# Patient Record
Sex: Female | Born: 1990
Health system: Southern US, Community
[De-identification: ages and names within clinical notes are randomized; demographics above are authoritative.]

## PROBLEM LIST (undated history)

## (undated) ENCOUNTER — Inpatient Hospital Stay (HOSPITAL_COMMUNITY): Payer: Self-pay

## (undated) DIAGNOSIS — A630 Anogenital (venereal) warts: Secondary | ICD-10-CM

## (undated) DIAGNOSIS — K624 Stenosis of anus and rectum: Secondary | ICD-10-CM

## (undated) DIAGNOSIS — I10 Essential (primary) hypertension: Secondary | ICD-10-CM

## (undated) DIAGNOSIS — K644 Residual hemorrhoidal skin tags: Secondary | ICD-10-CM

## (undated) DIAGNOSIS — B977 Papillomavirus as the cause of diseases classified elsewhere: Secondary | ICD-10-CM

## (undated) DIAGNOSIS — D649 Anemia, unspecified: Secondary | ICD-10-CM

## (undated) DIAGNOSIS — K8020 Calculus of gallbladder without cholecystitis without obstruction: Secondary | ICD-10-CM

## (undated) HISTORY — DX: Residual hemorrhoidal skin tags: K64.4

## (undated) HISTORY — DX: Anemia, unspecified: D64.9

## (undated) HISTORY — DX: Papillomavirus as the cause of diseases classified elsewhere: B97.7

## (undated) HISTORY — DX: Calculus of gallbladder without cholecystitis without obstruction: K80.20

---
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
## 2010-01-20 ENCOUNTER — Emergency Department (HOSPITAL_COMMUNITY): Admission: EM | Admit: 2010-01-20 | Discharge: 2010-01-21 | Payer: Self-pay | Admitting: Emergency Medicine

## 2010-03-26 ENCOUNTER — Inpatient Hospital Stay (HOSPITAL_COMMUNITY)
Admission: AD | Admit: 2010-03-26 | Discharge: 2010-03-26 | Payer: Self-pay | Source: Home / Self Care | Attending: Obstetrics and Gynecology | Admitting: Obstetrics and Gynecology

## 2010-06-07 LAB — URINE CULTURE
Colony Count: 15000
Culture  Setup Time: 201112301818

## 2010-06-07 LAB — WET PREP, GENITAL: Clue Cells Wet Prep HPF POC: NONE SEEN

## 2010-06-07 LAB — URINALYSIS, ROUTINE W REFLEX MICROSCOPIC
Bilirubin Urine: NEGATIVE
Glucose, UA: NEGATIVE mg/dL
Hgb urine dipstick: NEGATIVE
Ketones, ur: >80 mg/dL — AB
Nitrite: NEGATIVE
Protein, ur: 30 mg/dL — AB
Specific Gravity, Urine: 1.025 (ref 1.005–1.030)
Urobilinogen, UA: 1 mg/dL (ref 0.0–1.0)
pH: 6 (ref 5.0–8.0)

## 2010-06-07 LAB — URINE MICROSCOPIC-ADD ON

## 2010-06-07 LAB — GC/CHLAMYDIA PROBE AMP, GENITAL
Chlamydia, DNA Probe: NEGATIVE
GC Probe Amp, Genital: NEGATIVE

## 2010-07-13 LAB — CBC
HCT: 34.3 % — ABNORMAL LOW (ref 36.0–46.0)
Hemoglobin: 11.5 g/dL — ABNORMAL LOW (ref 12.0–15.0)
MCHC: 33.5 g/dL (ref 30.0–36.0)
MCV: 86.2 fL (ref 78.0–100.0)
Platelets: 275 10*3/uL (ref 150–400)
RBC: 3.98 MIL/uL (ref 3.87–5.11)
RDW: 13.4 % (ref 11.5–15.5)
WBC: 8.6 10*3/uL (ref 4.0–10.5)

## 2010-07-13 LAB — WET PREP, GENITAL
Clue Cells Wet Prep HPF POC: NONE SEEN
Trich, Wet Prep: NONE SEEN
Yeast Wet Prep HPF POC: NONE SEEN

## 2010-07-13 LAB — GC/CHLAMYDIA PROBE AMP, GENITAL
Chlamydia, DNA Probe: POSITIVE — AB
GC Probe Amp, Genital: NEGATIVE

## 2010-07-13 LAB — POCT PREGNANCY, URINE: Preg Test, Ur: POSITIVE

## 2010-07-13 LAB — ABO/RH: ABO/RH(D): A POS

## 2010-07-13 LAB — HCG, QUANTITATIVE, PREGNANCY: hCG, Beta Chain, Quant, S: 848 m[IU]/mL — ABNORMAL HIGH (ref <5)

## 2010-08-13 NOTE — H&P (Signed)
[place]  Patient:    XOIE, KREUSER Visit Number: [id number] MRN: [id number]          Service Type: PSY Location: 600 0600 01 Attending Physician:  Chaney Born. Dictated by:   Donnelly Angelica, M.D. Admit Date:  04/27/2001                     Psychiatric Admission Assessment  INTRODUCTION:  Amber Blanchard is an 20 year old girl.  CHIEF COMPLAINT:  Philip was admitted to the hospital after she reportedly threatened to stab her teacher.  HISTORY OF PRESENT ILLNESS:  Shantasia said she did make the threat to stab her teacher but she said she would not really do it.  She was mad at the teacher because the teacher had accused her of talking in class and was taking her out of the class apparently.  She said everybody else was talking to and it was unfair that she should be singled out.  Erynne said it is not unusual for her to get into trouble at school.  She was in [facility] to begin with for telling her other teacher to shut up.  She says she gets into trouble regularly for running her mouth at school and at home.  She said she has always been this way and it is not new.  She said, in reality, even though she makes threats to kill and hurt people, she would not actually kill anybody.  She might be willing to hurt them however.  FAMILY/SCHOOL/SOCIAL ISSUES:  She says she lives with her mother and she and her mother get along fairly well.  Her dad, she says, is in prison.  She does not know why he is there.  He did not tell her and mother did not tell her either.  She said she would like to see him but he does not want visitors in prison.  Consequently, she has not seen him.  She said she has four other sisters, all half-sisters, and two half-brothers, who do not live in the home. She denied any history of physical or sexual abuse.  She says school is not her favorite time of the day.  She does okay academically but she has always had behavioral  problems.  PREVIOUS PSYCHIATRIC TREATMENT:  This is her fourth psychiatric hospitalization.  She said the other three were in [place], where she was living, and she said they did help her some with her temper control she thinks.  She supposed to be seeing an outpatient therapist and psychiatrist in the [facility] but reportedly is not consistent with appointments.  MEDICAL PROBLEMS/ALLERGIES/MEDICATIONS:  She is not aware of any medical problems.  She has no known allergies to medications.  She said she is supposed to be taking Zyprexa but she has not been taking it.  She has a history of bed-wetting.  She did not mention that to me.  LEGAL/SUBSTANCE ABUSE ISSUES:  None.  MENTAL STATUS:  At the time of the initial evaluation revealed an alert, oriented girl, who came to the interview willingly and was cooperative.  She was appropriately dressed and groomed.  She admitted to threatening her teacher and she admitted to making threats to other people over time as well as to causing a lot of problems because of her attitude but she said, in reality, she would not kill anyone or even try killing anybody.  She denied any threats towards herself and said she did not feel that  depressed, though she seems to be more depressed than she admits.  There was no evidence of any thought disorder or other psychosis.  Short and long-term memory appeared to be intact based on her ability to recall recent and remote events in her own life.  Her judgment currently seemed adequate.  Insight was minimal. Intellectual functioning seemed at least average.  Concentration was adequate from one-to-one interview.  ASSETS:  Tvisha seems cooperative so far.  ADMISSION DIAGNOSES: Axis I:    1. Mood disorder not otherwise specified.            2. Oppositional defiant disorder. Axis II:   Deferred. Axis III:  Enuresis. Axis IV:   Severe. Axis V:    45/55.  ESTIMATED LENGTH OF STAY:   Five to seven days.  PLAN:  Stabilize to the point of making no threats towards anyone and having a plan for dealing with her temper more effectively by the time of discharge.  Dr. Burna Cash will be the attending. Dictated by:   Donnelly Angelica, M.D. Attending Physician:  Chaney Born DD:  04/28/01 TD:  04/30/01 Job: [id number]

## 2010-08-13 NOTE — Consult Note (Signed)
NAMEGLORIANNE, PROCTOR NO.:  [id number]   MEDICAL RECORD NO.:  [id number]          PATIENT TYPE:  MAT   LOCATION:  [facility]                          FACILITY:  [facility]   PHYSICIAN:  Eli Hose, M.D.DATE OF BIRTH:  1990-09-29   DATE OF CONSULTATION:  09/23/2004  DATE OF DISCHARGE:                                   CONSULTATION   HISTORY OF PRESENT ILLNESS:  Ms. Greenley is a 20 year old female, gravida 0,  who presents complaining of tender swelling in the vagina.  The patient was  seen earlier and was told that she had a Bartholin's abscess.  She was  started on Keflex and she was given pain medication.  The patient reports  that she is not sexually active and she has had no other problems.   PAST MEDICAL HISTORY:  The patient is obese, and she has hypertension.   DRUG ALLERGIES:  No known drug allergies.   SOCIAL HISTORY:  The patient denies cigarette use, alcohol use and  recreational drug use.   OBJECTIVE:  VITAL SIGNS:  Temperature is 98.2, pulse 80, respirations 20,  blood pressure is 114/85.  EXAM:  External genitalia is normal except for erythema on the left with  drainage from an open Bartholin's abscess.  The area is slightly tender to  exam.  There is no evidence of cellulitis.   ASSESSMENT:  Draining left Bartholin's abscess.   PLAN:  The patient reports that she feels much better, and therefore we will  simply allow the patient to sit in a tub of hot water two to three times a  day.  She will continue her Keflex 500 mg twice each day for 10 days.  She  will return to the office in two to three weeks for follow-up examination.  She will call for questions or concerns or should she not continue to  improve.       AVS/MEDQ  D:  09/23/2004  T:  09/23/2004  Job:  [id number]

## 2010-08-13 NOTE — Discharge Summary (Signed)
[place]  Patient:    Amber Blanchard, Amber Blanchard Visit Number: [id number] MRN: [id number]          Service Type: PSY Location: 600 0600 01 Attending Physician:  Chaney Born. Dictated by:   Chaney Born, M.D. Admit Date:  04/27/2001 Disc. Date: 05/04/01                             Discharge Summary  REASON FOR ADMISSION:  This 20 year old African-American female was admitted for inpatient psychiatric stabilization after becoming agitated and threatening to to stab her teacher.  For further history of present illness, please see the patients psychiatric admission assessment.  PHYSICAL EXAMINATION AT THE TIME OF ADMISSION:  History of chronic constipation, overweight, ringworm on her left arm, as well as a history of urinary incontinence.  She had an otherwise unremarkable physical examination.  LABORATORY EXAMINATION:  Basic metabolic panel was within normal limits.  CBC showed MCHC 34.1 and was otherwise unremarkable.  Free T4 was within normal limits, TSH was within normal limits.  GGT was within normal limits.  Hepatic panel was within normal limits.  Urine drug screen was negative.  Blood alcohol level was undetectable.  UA was unremarkable.  The patient received no x-rays, no special procedures, no additional consultations.  She sustained no complications during the course of this hospitalization.  HOSPITAL COURSE:  On admission, the patients affect and mood were depressed, irritable, and angry.  She showed frequent temper outbursts and oppositional and defiant behavior.  Her concentration and attention span were decreased. She was isolative and withdrawn.  She was begun on a trial of Effexor XR but was unable to swallow pills large enough to be a therapeutic dose and was discontinued from Effexor.  She was given Celexa liquid at 20 mg p.o. q.d. and tolerated this medication well without side effects.  At the time of discharge she denies any  homicidal or suicidal ideation.  Her affect and mood have improved.  She is participating in all aspects of the therapeutic treatment program.  She is less oppositional and defiant and more redirectable within the milieu.  She no longer appears to be a danger to herself or other and is motivated for outpatient therapy.  Consequently, it is felt she has reached her maximum benefits of hospitalization and is ready for discharge to a less restrictive alternative setting.  CONDITION ON DISCHARGE:  Improved.  DIAGNOSES: Axis I:    1. Major depression, recurrent type, severe without psychosis.            2. Oppositional defiant disorder.            3. Rule out conduct disorder. Axis II:   1. Rule out learning disorder, not otherwise specified.            2. Rule out personality disorder, not otherwise specified. Axis III:  1. Enuresis.            2. Obesity.            3. Ringworm.            4. Chronic constipation. Axis IV:   Current psychosocial stressors are severe. Axis V:    20 on admission, 30 on discharge.  FURTHER EVALUATION AND TREATMENT RECOMMENDATIONS: 1. The patient is discharged to home. 2. She is discharged on an unrestricted level of activity and a regular diet. 3. She will follow up with her outpatient psychiatrist  for all further aspects    of her psychiatric care.  As she will be following up with her outpatient    psychiatrist and primary care physician for all further aspects of her    medical care, I will sign off on the case at this time.  DISCHARGE MEDICATIONS: 1. Celexa liquid 20 mg p.o. q.d. 2. DDAVP 0.2 mg p.o. q.h.s. Dictated by:   Chaney Born, M.D. Attending Physician:  Chaney Born DD:  05/04/01 TD:  05/04/01 Job: [id number]

## 2010-10-19 ENCOUNTER — Inpatient Hospital Stay (HOSPITAL_COMMUNITY)
Admission: RE | Admit: 2010-10-19 | Discharge: 2010-10-23 | DRG: 766 | Disposition: A | Discharge status: Home / Self Care | Payer: 59 | Source: Ambulatory Visit | Attending: Obstetrics and Gynecology | Admitting: Obstetrics and Gynecology

## 2010-10-19 ENCOUNTER — Encounter (HOSPITAL_COMMUNITY): Payer: Self-pay

## 2010-10-19 DIAGNOSIS — O324XX0 Maternal care for high head at term, not applicable or unspecified: Secondary | ICD-10-CM | POA: Diagnosis present

## 2010-10-19 DIAGNOSIS — D649 Anemia, unspecified: Secondary | ICD-10-CM

## 2010-10-19 DIAGNOSIS — O480 Post-term pregnancy: Principal | ICD-10-CM | POA: Diagnosis present

## 2010-10-19 DIAGNOSIS — O330 Maternal care for disproportion due to deformity of maternal pelvic bones: Secondary | ICD-10-CM | POA: Diagnosis present

## 2010-10-19 DIAGNOSIS — O339 Maternal care for disproportion, unspecified: Secondary | ICD-10-CM | POA: Diagnosis present

## 2010-10-19 HISTORY — DX: Essential (primary) hypertension: I10

## 2010-10-19 LAB — CBC
HCT: 33.1 % — ABNORMAL LOW (ref 36.0–46.0)
Hemoglobin: 11.1 g/dL — ABNORMAL LOW (ref 12.0–15.0)
MCH: 29.1 pg (ref 26.0–34.0)
MCHC: 33.5 g/dL (ref 30.0–36.0)
MCV: 86.9 fL (ref 78.0–100.0)
Platelets: 294 10*3/uL (ref 150–400)
RBC: 3.81 MIL/uL — ABNORMAL LOW (ref 3.87–5.11)
RDW: 13.7 % (ref 11.5–15.5)
WBC: 10.6 10*3/uL — ABNORMAL HIGH (ref 4.0–10.5)

## 2010-10-19 LAB — TYPE AND SCREEN: Antibody Screen: NEGATIVE

## 2010-10-19 LAB — STREP B DNA PROBE: GBS: POSITIVE

## 2010-10-19 LAB — ABO/RH: RH Type: POSITIVE

## 2010-10-19 LAB — RPR: RPR: NONREACTIVE

## 2010-10-19 LAB — HIV ANTIBODY (ROUTINE TESTING W REFLEX): HIV: NONREACTIVE

## 2010-10-19 MED ORDER — NALBUPHINE SYRINGE 5 MG/0.5 ML
5.0000 mg | INJECTION | INTRAMUSCULAR | Status: DC | PRN
  Filled 2010-10-19: qty 0.5

## 2010-10-19 MED ORDER — OXYTOCIN 20 UNITS IN LACTATED RINGERS INFUSION - SIMPLE: 125.0000 mL/h | Freq: Once | INTRAVENOUS | Status: DC

## 2010-10-19 MED ORDER — LACTATED RINGERS IV SOLN
INTRAVENOUS | Status: DC
  Administered 2010-10-19: 21:00:00 via INTRAVENOUS
  Administered 2010-10-20: 125 mL/h via INTRAVENOUS
  Administered 2010-10-21 (×2): via INTRAVENOUS

## 2010-10-19 MED ORDER — DINOPROSTONE 10 MG VA INST
10.0000 mg | VAGINAL_INSERT | Freq: Once | VAGINAL | Status: AC
  Administered 2010-10-19: 10 mg via VAGINAL
  Filled 2010-10-19: qty 1

## 2010-10-19 MED ORDER — ACETAMINOPHEN 325 MG PO TABS: 650.0000 mg | ORAL_TABLET | ORAL | Status: DC | PRN

## 2010-10-19 MED ORDER — LACTATED RINGERS IV SOLN
500.0000 mL | INTRAVENOUS | Status: DC | PRN
  Administered 2010-10-20: 500 mL via INTRAVENOUS

## 2010-10-19 MED ORDER — PENICILLIN G POTASSIUM 5000000 UNITS IJ SOLR
5.0000 10*6.[IU] | Freq: Once | INTRAVENOUS | Status: AC | PRN
  Filled 2010-10-19: qty 5

## 2010-10-19 MED ORDER — CITRIC ACID-SODIUM CITRATE 334-500 MG/5ML PO SOLN
30.0000 mL | ORAL | Status: DC | PRN
  Administered 2010-10-21: 30 mL via ORAL
  Filled 2010-10-19: qty 15

## 2010-10-19 MED ORDER — OXYCODONE-ACETAMINOPHEN 5-325 MG PO TABS: 2.0000 | ORAL_TABLET | ORAL | Status: DC | PRN

## 2010-10-19 MED ORDER — LIDOCAINE HCL (PF) 1 % IJ SOLN
30.0000 mL | INTRAMUSCULAR | Status: DC | PRN
  Filled 2010-10-19 (×2): qty 30

## 2010-10-19 MED ORDER — IBUPROFEN 600 MG PO TABS: 600.0000 mg | ORAL_TABLET | Freq: Four times a day (QID) | ORAL | Status: DC | PRN

## 2010-10-19 MED ORDER — ZOLPIDEM TARTRATE 10 MG PO TABS: 10.0000 mg | ORAL_TABLET | Freq: Every evening | ORAL | Status: DC | PRN

## 2010-10-19 MED ORDER — TERBUTALINE SULFATE 1 MG/ML IJ SOLN: 0.2500 mg | Freq: Once | INTRAMUSCULAR | Status: AC | PRN

## 2010-10-19 MED ORDER — PENICILLIN G POTASSIUM 5000000 UNITS IJ SOLR: 2.5000 10*6.[IU] | INTRAVENOUS | Status: DC | PRN

## 2010-10-19 MED ORDER — ONDANSETRON HCL 4 MG/2ML IJ SOLN: 4.0000 mg | Freq: Four times a day (QID) | INTRAMUSCULAR | Status: DC | PRN

## 2010-10-19 MED ORDER — FLEET ENEMA 7-19 GM/118ML RE ENEM: 1.0000 | ENEMA | RECTAL | Status: DC | PRN

## 2010-10-20 ENCOUNTER — Encounter (HOSPITAL_COMMUNITY): Payer: Self-pay

## 2010-10-20 ENCOUNTER — Encounter (HOSPITAL_COMMUNITY): Payer: Self-pay | Admitting: Anesthesiology

## 2010-10-20 ENCOUNTER — Inpatient Hospital Stay (HOSPITAL_COMMUNITY): Payer: 59 | Admitting: Anesthesiology

## 2010-10-20 LAB — RPR: RPR Ser Ql: NONREACTIVE

## 2010-10-20 MED ORDER — LACTATED RINGERS IV SOLN: 500.0000 mL | Freq: Once | INTRAVENOUS | Status: DC

## 2010-10-20 MED ORDER — PENICILLIN G POTASSIUM 5000000 UNITS IJ SOLR
2.5000 10*6.[IU] | INTRAVENOUS | Status: DC
  Administered 2010-10-20: 2.5 10*6.[IU] via INTRAVENOUS
  Filled 2010-10-20 (×5): qty 2.5

## 2010-10-20 MED ORDER — PHENYLEPHRINE 40 MCG/ML (10ML) SYRINGE FOR IV PUSH (FOR BLOOD PRESSURE SUPPORT)
80.0000 ug | PREFILLED_SYRINGE | INTRAVENOUS | Status: DC | PRN
  Filled 2010-10-20: qty 5

## 2010-10-20 MED ORDER — EPHEDRINE 5 MG/ML INJ
10.0000 mg | INTRAVENOUS | Status: DC | PRN
  Filled 2010-10-20: qty 4

## 2010-10-20 MED ORDER — PENICILLIN G POTASSIUM 5000000 UNITS IJ SOLR
5.0000 10*6.[IU] | Freq: Once | INTRAVENOUS | Status: AC
  Administered 2010-10-20: 5 10*6.[IU] via INTRAVENOUS
  Filled 2010-10-20: qty 5

## 2010-10-20 MED ORDER — FENTANYL 2.5 MCG/ML BUPIVACAINE 1/10 % EPIDURAL INFUSION (WH - ANES)
14.0000 mL/h | INTRAMUSCULAR | Status: DC
  Administered 2010-10-20 (×3): 14 mL/h via EPIDURAL
  Filled 2010-10-20 (×3): qty 60

## 2010-10-20 MED ORDER — TERBUTALINE SULFATE 1 MG/ML IJ SOLN: 0.2500 mg | Freq: Once | INTRAMUSCULAR | Status: AC | PRN

## 2010-10-20 MED ORDER — PHENYLEPHRINE 40 MCG/ML (10ML) SYRINGE FOR IV PUSH (FOR BLOOD PRESSURE SUPPORT)
80.0000 ug | PREFILLED_SYRINGE | INTRAVENOUS | Status: DC | PRN
  Filled 2010-10-20 (×2): qty 5

## 2010-10-20 MED ORDER — OXYTOCIN 20 UNITS IN LACTATED RINGERS INFUSION - SIMPLE
2.0000 m[IU]/min | INTRAVENOUS | Status: DC
  Administered 2010-10-20: 2 m[IU]/min via INTRAVENOUS
  Filled 2010-10-20: qty 1000

## 2010-10-20 MED ORDER — DIPHENHYDRAMINE HCL 50 MG/ML IJ SOLN: 12.5000 mg | INTRAMUSCULAR | Status: DC | PRN

## 2010-10-20 MED ORDER — EPHEDRINE 5 MG/ML INJ
10.0000 mg | INTRAVENOUS | Status: DC | PRN
  Filled 2010-10-20 (×2): qty 4

## 2010-10-20 NOTE — Progress Notes (Signed)
Pt comfortable with epidural Af vss cx 4.5/ 80/-1 Arom clear fluid IUPC placed. Continue pitocin Anticipate SVD.

## 2010-10-20 NOTE — H&P (Signed)
Amber Blanchard is a 20 y.o. female presenting for induction secondary to post dates.  She is 40 wks and 6 days based on LMP 01/07/2010 with EDD 10/14/2010.  + FM no ctx no vaginal bleeding no lof  POB hx SAB x 1 EAB x 3 Pgyn hx chlamydia tx 2010....  Trichomonas treated 04/2010  Meds Colace Allergies Latex.... NKDA   OB History    Grav Para Term Preterm Abortions TAB SAB Ect Mult Living   4 0   3 1 2         Past Medical History  Diagnosis Date  . As child and both parents have hypertension but none now    History reviewed. No pertinent past surgical history. Family History: family history includes Hypertension in her father and mother. Social History:  reports that she has never smoked. She does not have any smokeless tobacco history on file. She reports that she does not drink alcohol or use illicit drugs.  ROS Negative  Dilation: 2 Effacement (%): 50 Exam by:: a landon Blood pressure 120/58, pulse 65, temperature 98.4 F (36.9 C), temperature source Oral, resp. rate 20, height 5' 3"  (1.6 m), last menstrual period 01/07/2010.  CV rrr Lungs Clear Abdomen Gravid  Ext 1+ edema bilaterally  Prenatal labs: ABO, Rh:  A positive  Antibody: Negative (07/24 0000) Rubella:  Immune  RPR: NON REACTIVE (07/24 2030)  HBsAg:   Negative  HIV: Non-reactive (07/24 0000)  GBS: Positive (07/24 0000)   Assessment/Plan: 40 wks 6 days post dates for induction  Cervidil Pcn in active labor or with ROM    Anahis Furgeson J. 10/20/2010, 8:30 AM

## 2010-10-20 NOTE — Anesthesia Preprocedure Evaluation (Addendum)
Anesthesia Evaluation  Name, MR# and DOB Patient awake  General Assessment Comment  Reviewed: Allergy & Precautions, H&P  and Patient's Chart, lab work & pertinent test results  Airway Mallampati: II TM Distance: >3 FB Neck ROM: full    Dental  (+) Teeth Intact   Pulmonary  clear to auscultation    Cardiovascular Hypertension: denies. regular Normal   Neuro/Psych  GI/Hepatic/Renal   Endo/Other   (+)  Morbid obesity Abdominal   Musculoskeletal  Hematology   Peds  Reproductive/Obstetrics (+) Pregnancy   Anesthesia Other Findings                 Anesthesia Physical Anesthesia Plan  ASA: III  Anesthesia Plan: Epidural   Post-op Pain Management:    Induction:   Airway Management Planned:   Additional Equipment:   Intra-op Plan:   Post-operative Plan:   Informed Consent: I have reviewed the patients History and Physical, chart, labs and discussed the procedure including the risks, benefits and alternatives for the proposed anesthesia with the patient or authorized representative who has indicated his/her understanding and acceptance.   Dental Advisory Given  Plan Discussed with: CRNA and Surgeon  Anesthesia Plan Comments: (Labs checked- platelets confirmed with RN in room. Fetal heart tracing, per RN, reportedly stable enough for sitting procedure. Discussed epidural, and patient consents to the procedure:  included risk of possible headache,backache, failed block, allergic reaction, and nerve injury. This patient was asked if she had any questions or concerns before the procedure started. )        Anesthesia Quick Evaluation

## 2010-10-20 NOTE — Anesthesia Procedure Notes (Addendum)
Epidural Patient location during procedure: OB Start time: 10/20/2010 6:03 PM  Staffing Anesthesiologist: Jiles Garter  Preanesthetic Checklist Completed: patient identified, site marked, surgical consent, pre-op evaluation, timeout performed, IV checked, risks and benefits discussed and monitors and equipment checked  Epidural Patient position: sitting Prep: site prepped and draped and DuraPrep Patient monitoring: continuous pulse ox and blood pressure Approach: midline Injection technique: LOR air  Needle:  Needle type: Tuohy  Needle gauge: 17 G Needle length: 9 cm Catheter type: closed end flexible Catheter size: 19 Gauge Catheter at skin depth: 15 cm Test dose: negative  Assessment Events: blood not aspirated, injection not painful, no injection resistance, negative IV test and no paresthesia  Additional Notes LOR @ 10cm, catheter @Skin  20 cm Difficult landmarks.......obesity Dosing of Epidural: 1st dose, Through needle...... 5mg  Marcaine 2nd dose, through catheter.... epi 1:200K + Xylocaine 40 mg 3rd dose, through catheter...Marland KitchenMarland Kitchenepi 1:200K + Xylocaine 40 mg Each dose occurred after waiting 3 min,patient was free of IV sx; and patient exhibits no evidence of SA injection  Patient is more comfortable after epidural dosed. Please see RN's note for documentation of vital signs,and FHR which are stable.

## 2010-10-21 ENCOUNTER — Encounter (HOSPITAL_COMMUNITY): Payer: Self-pay

## 2010-10-21 ENCOUNTER — Encounter (HOSPITAL_COMMUNITY)
Admission: RE | Disposition: A | Discharge status: Home / Self Care | Payer: Self-pay | Source: Ambulatory Visit | Attending: Obstetrics and Gynecology

## 2010-10-21 ENCOUNTER — Encounter (HOSPITAL_COMMUNITY): Payer: Self-pay | Admitting: Anesthesiology

## 2010-10-21 SURGERY — Surgical Case
Anesthesia: Regional

## 2010-10-21 MED ORDER — KETOROLAC TROMETHAMINE 60 MG/2ML IM SOLN
60.0000 mg | Freq: Once | INTRAMUSCULAR | Status: AC | PRN
  Administered 2010-10-21: 60 mg via INTRAMUSCULAR

## 2010-10-21 MED ORDER — FENTANYL CITRATE 0.05 MG/ML IJ SOLN: 25.0000 ug | INTRAMUSCULAR | Status: DC | PRN

## 2010-10-21 MED ORDER — SODIUM BICARBONATE 8.4 % IV SOLN
INTRAVENOUS | Status: DC | PRN
  Administered 2010-10-21: 10 mL via EPIDURAL

## 2010-10-21 MED ORDER — MEPERIDINE HCL 25 MG/ML IJ SOLN
INTRAMUSCULAR | Status: DC | PRN
  Administered 2010-10-21: 25 mg via INTRAVENOUS

## 2010-10-21 MED ORDER — PRENATAL PLUS 27-1 MG PO TABS: 1.0000 | ORAL_TABLET | Freq: Every day | ORAL | Status: DC

## 2010-10-21 MED ORDER — ONDANSETRON HCL 4 MG/2ML IJ SOLN: 4.0000 mg | INTRAMUSCULAR | Status: DC | PRN

## 2010-10-21 MED ORDER — SODIUM BICARBONATE 8.4 % IV SOLN
INTRAVENOUS | Status: AC
  Filled 2010-10-21: qty 50

## 2010-10-21 MED ORDER — SIMETHICONE 80 MG PO CHEW: 80.0000 mg | CHEWABLE_TABLET | ORAL | Status: DC | PRN

## 2010-10-21 MED ORDER — ONDANSETRON HCL 4 MG/2ML IJ SOLN
INTRAMUSCULAR | Status: DC | PRN
  Administered 2010-10-21: 4 mg via INTRAVENOUS

## 2010-10-21 MED ORDER — SIMETHICONE 80 MG PO CHEW
80.0000 mg | CHEWABLE_TABLET | Freq: Three times a day (TID) | ORAL | Status: DC
  Administered 2010-10-21 – 2010-10-23 (×9): 80 mg via ORAL

## 2010-10-21 MED ORDER — MEPERIDINE HCL 25 MG/ML IJ SOLN
INTRAMUSCULAR | Status: AC
  Filled 2010-10-21: qty 1

## 2010-10-21 MED ORDER — KETOROLAC TROMETHAMINE 30 MG/ML IJ SOLN: 30.0000 mg | Freq: Four times a day (QID) | INTRAMUSCULAR | Status: AC | PRN

## 2010-10-21 MED ORDER — EPHEDRINE SULFATE 50 MG/ML IJ SOLN
INTRAMUSCULAR | Status: DC | PRN
  Administered 2010-10-21: 10 mg via INTRAVENOUS
  Administered 2010-10-21: 7 mg via INTRAVENOUS

## 2010-10-21 MED ORDER — MENTHOL 3 MG MT LOZG: 1.0000 | LOZENGE | OROMUCOSAL | Status: DC | PRN

## 2010-10-21 MED ORDER — OXYTOCIN 20 UNITS IN LACTATED RINGERS INFUSION - SIMPLE
INTRAVENOUS | Status: DC | PRN
  Administered 2010-10-21 (×2): 20 [IU] via INTRAVENOUS

## 2010-10-21 MED ORDER — CEFAZOLIN SODIUM-DEXTROSE 2-3 GM-% IV SOLR
2.0000 g | Freq: Once | INTRAVENOUS | Status: AC
  Administered 2010-10-21: 2 g via INTRAVENOUS
  Filled 2010-10-21: qty 50

## 2010-10-21 MED ORDER — MORPHINE SULFATE (PF) 0.5 MG/ML IJ SOLN
INTRAMUSCULAR | Status: DC | PRN
  Administered 2010-10-21: 2 mg via INTRAVENOUS

## 2010-10-21 MED ORDER — IBUPROFEN 600 MG PO TABS
600.0000 mg | ORAL_TABLET | Freq: Four times a day (QID) | ORAL | Status: DC | PRN
  Administered 2010-10-22: 600 mg via ORAL
  Filled 2010-10-21 (×8): qty 1

## 2010-10-21 MED ORDER — CITRIC ACID-SODIUM CITRATE 334-500 MG/5ML PO SOLN: 30.0000 mL | Freq: Once | ORAL | Status: DC

## 2010-10-21 MED ORDER — ZOLPIDEM TARTRATE 5 MG PO TABS: 5.0000 mg | ORAL_TABLET | Freq: Every evening | ORAL | Status: DC | PRN

## 2010-10-21 MED ORDER — ACETAMINOPHEN 325 MG PO TABS: 325.0000 mg | ORAL_TABLET | ORAL | Status: DC | PRN

## 2010-10-21 MED ORDER — ONDANSETRON HCL 4 MG/2ML IJ SOLN
INTRAMUSCULAR | Status: AC
  Filled 2010-10-21: qty 2

## 2010-10-21 MED ORDER — FERROUS SULFATE 325 (65 FE) MG PO TABS
325.0000 mg | ORAL_TABLET | Freq: Two times a day (BID) | ORAL | Status: DC
  Administered 2010-10-22 – 2010-10-23 (×2): 325 mg via ORAL
  Filled 2010-10-21 (×2): qty 1

## 2010-10-21 MED ORDER — WITCH HAZEL-GLYCERIN EX PADS: MEDICATED_PAD | CUTANEOUS | Status: DC | PRN

## 2010-10-21 MED ORDER — DIPHENHYDRAMINE HCL 25 MG PO CAPS: 25.0000 mg | ORAL_CAPSULE | Freq: Four times a day (QID) | ORAL | Status: DC | PRN

## 2010-10-21 MED ORDER — OXYTOCIN 10 UNIT/ML IJ SOLN
INTRAMUSCULAR | Status: AC
  Filled 2010-10-21: qty 2

## 2010-10-21 MED ORDER — SIMETHICONE 80 MG PO CHEW: 80.0000 mg | CHEWABLE_TABLET | Freq: Three times a day (TID) | ORAL | Status: DC

## 2010-10-21 MED ORDER — FERROUS SULFATE 325 (65 FE) MG PO TABS: 325.0000 mg | ORAL_TABLET | Freq: Two times a day (BID) | ORAL | Status: DC

## 2010-10-21 MED ORDER — LIDOCAINE-EPINEPHRINE (PF) 2 %-1:200000 IJ SOLN
INTRAMUSCULAR | Status: AC
  Filled 2010-10-21: qty 20

## 2010-10-21 MED ORDER — ONDANSETRON HCL 4 MG PO TABS: 4.0000 mg | ORAL_TABLET | ORAL | Status: DC | PRN

## 2010-10-21 MED ORDER — MORPHINE SULFATE (PF) 0.5 MG/ML IJ SOLN
INTRAMUSCULAR | Status: DC | PRN
  Administered 2010-10-21: 3 mg via EPIDURAL

## 2010-10-21 MED ORDER — MEDROXYPROGESTERONE ACETATE 150 MG/ML IM SUSP: 150.0000 mg | INTRAMUSCULAR | Status: DC | PRN

## 2010-10-21 MED ORDER — IBUPROFEN 600 MG PO TABS
600.0000 mg | ORAL_TABLET | Freq: Four times a day (QID) | ORAL | Status: DC
  Administered 2010-10-22: 600 mg via ORAL

## 2010-10-21 MED ORDER — NALBUPHINE HCL 10 MG/ML IJ SOLN
5.0000 mg | INTRAMUSCULAR | Status: AC | PRN
  Filled 2010-10-21: qty 1

## 2010-10-21 MED ORDER — ONDANSETRON HCL 4 MG/2ML IJ SOLN: 4.0000 mg | Freq: Once | INTRAMUSCULAR | Status: DC | PRN

## 2010-10-21 MED ORDER — SENNOSIDES-DOCUSATE SODIUM 8.6-50 MG PO TABS: 1.0000 | ORAL_TABLET | Freq: Every day | ORAL | Status: DC

## 2010-10-21 MED ORDER — EPHEDRINE 5 MG/ML INJ
INTRAVENOUS | Status: AC
  Filled 2010-10-21: qty 10

## 2010-10-21 MED ORDER — KETOROLAC TROMETHAMINE 60 MG/2ML IM SOLN
INTRAMUSCULAR | Status: AC
  Administered 2010-10-21: 60 mg via INTRAMUSCULAR
  Filled 2010-10-21: qty 2

## 2010-10-21 MED ORDER — DOCUSATE SODIUM 100 MG PO CAPS
100.0000 mg | ORAL_CAPSULE | Freq: Two times a day (BID) | ORAL | Status: DC
  Administered 2010-10-22 – 2010-10-23 (×2): 100 mg via ORAL
  Filled 2010-10-21 (×2): qty 1

## 2010-10-21 MED ORDER — MEPERIDINE HCL 25 MG/ML IJ SOLN
6.2500 mg | INTRAMUSCULAR | Status: DC | PRN
  Administered 2010-10-21: 03:00:00 via INTRAVENOUS
  Administered 2010-10-21: 12.5 mg via INTRAVENOUS

## 2010-10-21 MED ORDER — OXYTOCIN 20 UNITS IN LACTATED RINGERS INFUSION - SIMPLE: 125.0000 mL/h | INTRAVENOUS | Status: AC

## 2010-10-21 MED ORDER — IBUPROFEN 600 MG PO TABS
600.0000 mg | ORAL_TABLET | Freq: Four times a day (QID) | ORAL | Status: DC
  Administered 2010-10-21 – 2010-10-23 (×6): 600 mg via ORAL

## 2010-10-21 MED ORDER — OXYCODONE-ACETAMINOPHEN 5-325 MG PO TABS: 1.0000 | ORAL_TABLET | ORAL | Status: DC | PRN

## 2010-10-21 MED ORDER — NALOXONE HCL 0.4 MG/ML IJ SOLN: 0.4000 mg | INTRAMUSCULAR | Status: DC | PRN

## 2010-10-21 MED ORDER — SENNOSIDES-DOCUSATE SODIUM 8.6-50 MG PO TABS
1.0000 | ORAL_TABLET | Freq: Every day | ORAL | Status: DC
  Administered 2010-10-21: 2 via ORAL
  Administered 2010-10-22: 1 via ORAL

## 2010-10-21 MED ORDER — PHENYLEPHRINE 40 MCG/ML (10ML) SYRINGE FOR IV PUSH (FOR BLOOD PRESSURE SUPPORT)
PREFILLED_SYRINGE | INTRAVENOUS | Status: AC
  Filled 2010-10-21: qty 5

## 2010-10-21 MED ORDER — PRENATAL PLUS 27-1 MG PO TABS
1.0000 | ORAL_TABLET | Freq: Every day | ORAL | Status: DC
  Administered 2010-10-22 – 2010-10-23 (×2): 1 via ORAL
  Filled 2010-10-21 (×2): qty 1

## 2010-10-21 MED ORDER — SODIUM CHLORIDE 0.9 % IJ SOLN: 3.0000 mL | INTRAMUSCULAR | Status: DC | PRN

## 2010-10-21 MED ORDER — SODIUM CHLORIDE 0.9 % IV SOLN
1.0000 ug/kg/h | INTRAVENOUS | Status: DC | PRN
  Filled 2010-10-21: qty 2.5

## 2010-10-21 MED ORDER — MORPHINE SULFATE 0.5 MG/ML IJ SOLN
INTRAMUSCULAR | Status: AC
  Filled 2010-10-21: qty 20

## 2010-10-21 SURGICAL SUPPLY — 38 items
APL SKNCLS STERI-STRIP NONHPOA (GAUZE/BANDAGES/DRESSINGS) ×1
BENZOIN TINCTURE PRP APPL 2/3 (GAUZE/BANDAGES/DRESSINGS) ×2 IMPLANT
CLOTH BEACON ORANGE TIMEOUT ST (SAFETY) ×2 IMPLANT
CONTAINER PREFILL 10% NBF 15ML (MISCELLANEOUS) IMPLANT
DRAPE UTILITY XL STRL (DRAPES) ×2 IMPLANT
ELECT REM PT RETURN 9FT ADLT (ELECTROSURGICAL) ×2
ELECTRODE REM PT RTRN 9FT ADLT (ELECTROSURGICAL) ×1 IMPLANT
EXTRACTOR VACUUM M CUP 4 TUBE (SUCTIONS) IMPLANT
GAUZE SPONGE 4X4 12PLY STRL LF (GAUZE/BANDAGES/DRESSINGS) ×2 IMPLANT
GLOVE BIOGEL M 6.5 STRL (GLOVE) ×2 IMPLANT
GLOVE BIOGEL PI IND STRL 6.5 (GLOVE) ×2 IMPLANT
GLOVE BIOGEL PI INDICATOR 6.5 (GLOVE) ×2
GLOVE SURG SS PI 7.5 STRL IVOR (GLOVE) ×4 IMPLANT
GOWN PREVENTION PLUS LG XLONG (DISPOSABLE) ×2 IMPLANT
GOWN PREVENTION PLUS XLARGE (GOWN DISPOSABLE) ×2 IMPLANT
KIT ABG SYR 3ML LUER SLIP (SYRINGE) IMPLANT
NEEDLE HYPO 25X5/8 SAFETYGLIDE (NEEDLE) IMPLANT
NS IRRIG 1000ML POUR BTL (IV SOLUTION) ×2 IMPLANT
PACK C SECTION WH (CUSTOM PROCEDURE TRAY) ×2 IMPLANT
PAD ABD 7.5X8 STRL (GAUZE/BANDAGES/DRESSINGS) ×2 IMPLANT
RTRCTR C-SECT PINK 25CM LRG (MISCELLANEOUS) ×2 IMPLANT
RTRCTR C-SECT PINK 34CM XLRG (MISCELLANEOUS) IMPLANT
SLEEVE SCD COMPRESS KNEE MED (MISCELLANEOUS) ×2 IMPLANT
STAPLER VISISTAT 35W (STAPLE) IMPLANT
STRIP CLOSURE SKIN 1/2X4 (GAUZE/BANDAGES/DRESSINGS) ×2 IMPLANT
SUT PDS AB 0 CT1 27 (SUTURE) ×4 IMPLANT
SUT PLAIN 0 NONE (SUTURE) IMPLANT
SUT PLAIN 2 0 XLH (SUTURE) ×2 IMPLANT
SUT VIC AB 0 CTX 36 (SUTURE) ×6
SUT VIC AB 0 CTX36XBRD ANBCTRL (SUTURE) ×6 IMPLANT
SUT VIC AB 2-0 CT1 27 (SUTURE) ×1
SUT VIC AB 2-0 CT1 TAPERPNT 27 (SUTURE) ×1 IMPLANT
SUT VIC AB 3-0 SH 27 (SUTURE)
SUT VIC AB 3-0 SH 27X BRD (SUTURE) IMPLANT
SUT VIC AB 4-0 KS 27 (SUTURE) ×2 IMPLANT
TOWEL OR 17X24 6PK STRL BLUE (TOWEL DISPOSABLE) ×4 IMPLANT
TRAY FOLEY CATH 14FR (SET/KITS/TRAYS/PACK) IMPLANT
WATER STERILE IRR 1000ML POUR (IV SOLUTION) ×2 IMPLANT

## 2010-10-21 NOTE — Transfer of Care (Signed)
Immediate Anesthesia Transfer of Care Note  Patient: Amber Blanchard  Procedure(s) Performed:  CESAREAN SECTION  Patient Location: PACU  Anesthesia Type: Epidural  Level of Consciousness: awake, alert  and oriented  Airway & Oxygen Therapy: Patient Spontanous Breathing  Post-op Assessment: Report given to PACU RN and Post -op Vital signs reviewed and stable  Post vital signs: Reviewed and stable  Complications: No apparent anesthesia complications

## 2010-10-21 NOTE — Op Note (Signed)
Cesarean Section Procedure Note  Indications: cephalo-pelvic disproportion and failure to progress: arrest of descent  Pre-operative Diagnosis: 40 week 5 day pregnancy.  Post-operative Diagnosis: same  Surgeon: Amber Blanchard.   Assistants: 2  Anesthesia: Epidural anesthesia  ASA Class: 2  Procedure Details  The patient was seen in the Holding Room. The risks, benefits, complications, treatment options, and expected outcomes were discussed with the patient.  The patient concurred with the proposed plan, giving informed consent.  The site of surgery properly noted/marked. The patient was taken to Operating Room # 2, identified as [name] and the procedure verified as C-Section Delivery. A Time Out was held and the above information confirmed.  After induction of anesthesia, the patient was draped and prepped in the usual sterile manner. A Pfannenstiel incision was made and carried down through the subcutaneous tissue to the fascia. Fascial incision was made and extended transversely. The fascia was separated from the underlying rectus tissue superiorly and inferiorly. The peritoneum was identified and entered. Peritoneal incision was extended longitudinally. The utero-vesical peritoneal reflection was incised transversely and the bladder flap was bluntly freed from the lower uterine segment. A low transverse uterine incision was made. Delivered from cephalic  presentation was a female infant with Apgar scores of 7 at one minute and 9 at five minutes. After the umbilical cord was clamped and cut cord blood was obtained for evaluation. The placenta was removed intact and appeared normal. The uterine outline, tubes and ovaries appeared normal. The uterine incision was closed with running locked sutures of 0 Vicryl. Hemostasis was observed. Lavage was carried out until clear. The peritoneum was reapproximated with 2-0 vicryl.  The fascia was then reapproximated with running sutures of 0 PDS. The skin  was reapproximated with 4-0 vicryl   Instrument, sponge, and needle counts were correct prior the abdominal closure and at the conclusion of the case.   Findings: Female infant cephalic presentation..  Estimated Blood Loss:  600 ml         Drains: none         Total IV Fluids: per anesthesia          Specimens: placenta to labor and delivery          Implants: foley          Complications:  None; patient tolerated the procedure well.         Disposition: PACU - hemodynamically stable.         Condition: stable  Attending Attestation: I performed the procedure.

## 2010-10-21 NOTE — Consult Note (Signed)
Asked to attend delivery of this baby by C/S at 79 wks for FTP. Labor was induced for postdates. Mom is GBS pos tx'd with Pen G. Infant had spont resp. Dried. Apgars 7/9. To central nursery. Care to assigned Ped.

## 2010-10-21 NOTE — Preoperative (Signed)
Beta Blockers   Reason not to administer Beta Blockers:Not Applicable 

## 2010-10-21 NOTE — Anesthesia Postprocedure Evaluation (Signed)
  Anesthesia Post-op Note  Patient: Amber Blanchard  Procedure(s) Performed:  CESAREAN SECTION   Patient is awake, responsive, moving her legs, and has signs of resolution of her numbness. Pain and nausea are reasonably well controlled. Vital signs are stable and clinically acceptable. Oxygen saturation is clinically acceptable. There are no apparent anesthetic complications at this time. Patient is ready for discharge.

## 2010-10-21 NOTE — Progress Notes (Signed)
Called to reassess patient Pt is complete and has been pushing for 2 hr.. Station is still 0 no descent into the pelvis.. Narrow pelvic arch. Pt advised that she can push for another hr. She desires cesarean section due to CPD... R/b/a of cesarean section discussed with the patient including but not limited to infection bleeding damage to bowel bladder and baby with the need for further surgery. R/O transfusion HIV/ HEP B&C discussed.. Pt voiced understanding and desires to proceed with cesarean section due to failure to descend.

## 2010-10-21 NOTE — Progress Notes (Signed)
Dr Richardson Dopp at bedside discussing risks and benefits of primary c section, pt verbalizes and understands, to proceed with primary c section

## 2010-10-22 LAB — CBC
HCT: 29 % — ABNORMAL LOW (ref 36.0–46.0)
Hemoglobin: 9.6 g/dL — ABNORMAL LOW (ref 12.0–15.0)
MCH: 29 pg (ref 26.0–34.0)
MCHC: 33.1 g/dL (ref 30.0–36.0)
MCV: 87.6 fL (ref 78.0–100.0)
Platelets: 251 10*3/uL (ref 150–400)
RBC: 3.31 MIL/uL — ABNORMAL LOW (ref 3.87–5.11)
RDW: 13.8 % (ref 11.5–15.5)
WBC: 10.9 10*3/uL — ABNORMAL HIGH (ref 4.0–10.5)

## 2010-10-22 MED ORDER — OXYCODONE-ACETAMINOPHEN 5-325 MG PO TABS
1.0000 | ORAL_TABLET | ORAL | Status: DC | PRN
  Administered 2010-10-22: 1 via ORAL
  Administered 2010-10-22: 2 via ORAL
  Administered 2010-10-22 (×4): 1 via ORAL
  Administered 2010-10-23 (×2): 2 via ORAL
  Filled 2010-10-22 (×2): qty 1
  Filled 2010-10-22: qty 2
  Filled 2010-10-22 (×2): qty 1
  Filled 2010-10-22 (×2): qty 2
  Filled 2010-10-22: qty 1

## 2010-10-22 NOTE — Progress Notes (Signed)
Post Partum Day 1 Subjective: no complaints, up ad lib, voiding, tolerating PO and + flatus  Objective: Blood pressure 118/73, pulse 65, temperature 98.3 F (36.8 C), temperature source Oral, resp. rate 19, height 5' 3"  (1.6 m), weight 115.667 kg (255 lb), last menstrual period 01/07/2010, SpO2 98.00%, unknown if currently breastfeeding.  Physical Exam:  General: alert and cooperative Lochia: appropriate Uterine Fundus: firm Incision: bandage clean dry and intact  DVT Evaluation: No evidence of DVT seen on physical exam.   Basename 10/22/10 0515 10/19/10 2030  HGB 9.6* 11.1*  HCT 29.0* 33.1*    Assessment/Plan: Plan for discharge tomorrow Pt desires circumcision or infant r/b/a/ discussed    LOS: 3 days   Amber Blanchard J. 10/22/2010, 4:14 PM

## 2010-10-23 MED ORDER — FERROUS SULFATE 325 (65 FE) MG PO TABS: 325.0000 mg | ORAL_TABLET | Freq: Two times a day (BID) | ORAL | Status: DC

## 2010-10-23 NOTE — Discharge Summary (Signed)
Obstetric Discharge Summary Reason for Admission: induction of labor Prenatal Procedures: none Intrapartum Procedures: cesarean: low cervical, transverse Postpartum Procedures: none Complications-Operative and Postpartum: none  Hemoglobin  Date Value Range Status  10/22/2010 9.6* 12.0-15.0 (g/dL) Final     HCT  Date Value Range Status  10/22/2010 29.0* 36.0-46.0 (%) Final    Discharge Diagnoses: Term Pregnancy-delivered  Discharge Information: Date: 10/23/2010 Activity: unrestricted and pelvic rest Diet: routine Medications: Colace Condition: stable Instructions: refer to practice specific booklet Discharge to: home   Newborn Data: Live born  Information for the patient's newborn:  Hanin, [place] [[id number]]  female ; APGAR , ; weight ;  Home with mother.  Ameliya Nicotra E 10/23/2010, 1:42 PM

## 2010-10-23 NOTE — Progress Notes (Signed)
Subjective: Postpartum Day 2: Cesarean Delivery Patient reports tolerating PO, + flatus and no problems voiding.    Objective: Vital signs in last 24 hours: Temp:  [98.3 F (36.8 C)-98.6 F (37 C)] 98.4 F (36.9 C) (07/28 0644) Pulse Rate:  [65-83] 80  (07/28 0644) Resp:  [18-19] 18  (07/28 0644) BP: (118-139)/(73-84) 139/79 mmHg (07/28 1610)  Physical Exam:  General: alert and no distress Lochia: appropriate Uterine Fundus: firm Incision: no significant drainage DVT Evaluation: No evidence of DVT seen on physical exam.   Basename 10/22/10 0515  HGB 9.6*  HCT 29.0*    Assessment/Plan: Status post Cesarean section. Doing well postoperatively.  Discharge home with standard precautions and return to clinic in 4-6 weeks.  Lynwood Kubisiak E 10/23/2010, 1:20 PM

## 2010-10-26 ENCOUNTER — Encounter (HOSPITAL_COMMUNITY): Payer: Self-pay | Admitting: Obstetrics and Gynecology

## 2010-11-14 ENCOUNTER — Emergency Department (HOSPITAL_COMMUNITY): Payer: 59

## 2010-11-14 ENCOUNTER — Emergency Department (HOSPITAL_COMMUNITY)
Admission: EM | Admit: 2010-11-14 | Discharge: 2010-11-14 | Disposition: A | Discharge status: Home / Self Care | Payer: 59 | Attending: Emergency Medicine | Admitting: Emergency Medicine

## 2010-11-14 DIAGNOSIS — R0989 Other specified symptoms and signs involving the circulatory and respiratory systems: Secondary | ICD-10-CM | POA: Insufficient documentation

## 2010-11-14 DIAGNOSIS — R0602 Shortness of breath: Secondary | ICD-10-CM | POA: Insufficient documentation

## 2010-11-14 DIAGNOSIS — R0789 Other chest pain: Secondary | ICD-10-CM | POA: Insufficient documentation

## 2010-11-14 DIAGNOSIS — R0609 Other forms of dyspnea: Secondary | ICD-10-CM | POA: Insufficient documentation

## 2010-11-14 LAB — BASIC METABOLIC PANEL
BUN: 9 mg/dL (ref 6–23)
CO2: 27 mEq/L (ref 19–32)
Calcium: 8.5 mg/dL (ref 8.4–10.5)
Chloride: 106 mEq/L (ref 96–112)
Creatinine, Ser: 0.72 mg/dL (ref 0.50–1.10)
GFR calc Af Amer: >60 mL/min (ref >60)
GFR calc non Af Amer: >60 mL/min (ref >60)
Glucose, Bld: 102 mg/dL — ABNORMAL HIGH (ref 70–99)
Potassium: 3.6 mEq/L (ref 3.5–5.1)
Sodium: 139 mEq/L (ref 135–145)

## 2010-11-14 LAB — CBC
HCT: 33.3 % — ABNORMAL LOW (ref 36.0–46.0)
Hemoglobin: 11.1 g/dL — ABNORMAL LOW (ref 12.0–15.0)
MCH: 28.3 pg (ref 26.0–34.0)
MCHC: 33.3 g/dL (ref 30.0–36.0)
MCV: 84.9 fL (ref 78.0–100.0)
Platelets: 379 10*3/uL (ref 150–400)
RBC: 3.92 MIL/uL (ref 3.87–5.11)
RDW: 12.8 % (ref 11.5–15.5)
WBC: 11.9 10*3/uL — ABNORMAL HIGH (ref 4.0–10.5)

## 2010-11-14 LAB — PRO B NATRIURETIC PEPTIDE: Pro B Natriuretic peptide (BNP): 103.3 pg/mL (ref 0–125)

## 2010-11-14 LAB — DIFFERENTIAL
Basophils Absolute: 0 10*3/uL (ref 0.0–0.1)
Basophils Relative: 0 % (ref 0–1)
Eosinophils Absolute: 0.3 10*3/uL (ref 0.0–0.7)
Eosinophils Relative: 2 % (ref 0–5)
Lymphocytes Relative: 17 % (ref 12–46)
Lymphs Abs: 2 10*3/uL (ref 0.7–4.0)
Monocytes Absolute: 0.8 10*3/uL (ref 0.1–1.0)
Monocytes Relative: 6 % (ref 3–12)
Neutro Abs: 8.9 10*3/uL — ABNORMAL HIGH (ref 1.7–7.7)
Neutrophils Relative %: 74 % (ref 43–77)

## 2010-11-14 LAB — D-DIMER, QUANTITATIVE: D-Dimer, Quant: 0.93 ug/mL-FEU — ABNORMAL HIGH (ref 0.00–0.48)

## 2010-11-14 MED ORDER — IOHEXOL 350 MG/ML SOLN
100.0000 mL | Freq: Once | INTRAVENOUS | Status: AC | PRN
  Administered 2010-11-14: 100 mL via INTRAVENOUS

## 2010-12-15 LAB — CULTURE, ROUTINE-ABSCESS

## 2012-08-14 ENCOUNTER — Inpatient Hospital Stay (HOSPITAL_COMMUNITY)
Admission: AD | Admit: 2012-08-14 | Discharge: 2012-08-14 | Disposition: A | Discharge status: Home / Self Care | Payer: 59 | Source: Ambulatory Visit | Attending: Family Medicine | Admitting: Family Medicine

## 2012-08-14 DIAGNOSIS — N949 Unspecified condition associated with female genital organs and menstrual cycle: Secondary | ICD-10-CM | POA: Insufficient documentation

## 2012-08-14 DIAGNOSIS — N760 Acute vaginitis: Secondary | ICD-10-CM | POA: Insufficient documentation

## 2012-08-14 DIAGNOSIS — A499 Bacterial infection, unspecified: Secondary | ICD-10-CM

## 2012-08-14 DIAGNOSIS — B9689 Other specified bacterial agents as the cause of diseases classified elsewhere: Secondary | ICD-10-CM

## 2012-08-14 LAB — URINE MICROSCOPIC-ADD ON

## 2012-08-14 LAB — WET PREP, GENITAL
Trich, Wet Prep: NONE SEEN
Yeast Wet Prep HPF POC: NONE SEEN

## 2012-08-14 LAB — URINALYSIS, ROUTINE W REFLEX MICROSCOPIC
Bilirubin Urine: NEGATIVE
Glucose, UA: NEGATIVE mg/dL
Hgb urine dipstick: NEGATIVE
Ketones, ur: 15 mg/dL — AB
Nitrite: NEGATIVE
Protein, ur: NEGATIVE mg/dL
Specific Gravity, Urine: >1.03 — ABNORMAL HIGH (ref 1.005–1.030)
Urobilinogen, UA: 0.2 mg/dL (ref 0.0–1.0)
pH: 5.5 (ref 5.0–8.0)

## 2012-08-14 LAB — POCT PREGNANCY, URINE: Preg Test, Ur: NEGATIVE

## 2012-08-14 MED ORDER — METRONIDAZOLE 500 MG PO TABS: 500.0000 mg | ORAL_TABLET | Freq: Two times a day (BID) | ORAL | Status: DC

## 2012-08-14 NOTE — MAU Note (Signed)
Pt states here for abnormal vaginal discharge. Is cloudy, watery, and has fishy odor. LMP-08/08/2012. Denies pain

## 2012-08-14 NOTE — MAU Provider Note (Signed)
Chart reviewed and agree with management and plan.

## 2012-08-14 NOTE — MAU Provider Note (Signed)
History     CSN: [id number]  Arrival date and time: 08/14/12 1815   None     Chief Complaint  Patient presents with  . Vaginal Discharge   HPI  Amber Blanchard is a 22 y.o. who presents today with what she thinks is a bacterial infection. She states that she the last day she has had a thin, white, malodorous discharge. She denies any itching.   Past Medical History  Diagnosis Date  . As child and both parents have hypertension but none now     Past Surgical History  Procedure Laterality Date  . Cesarean section  10/21/2010    Procedure: CESAREAN SECTION;  Surgeon: Jessee Avers;  Location: [facility];  Service: Gynecology;  Laterality: N/A;    Family History  Problem Relation Age of Onset  . Hypertension Mother   . Hypertension Father     History  Substance Use Topics  . Smoking status: Never Smoker   . Smokeless tobacco: Not on file  . Alcohol Use: No    Allergies: No Known Allergies  Prescriptions prior to admission  Medication Sig Dispense Refill  . Docusate Sodium (COLACE PO) Take 1 capsule by mouth daily as needed. Patient used medication for constipation.       . ferrous sulfate 325 (65 FE) MG tablet Take 1 tablet (325 mg total) by mouth 2 (two) times daily with a meal.  60 tablet  1    Review of Systems  Constitutional: Negative for fever.  Gastrointestinal: Negative for nausea, vomiting and abdominal pain.  Genitourinary: Negative for dysuria, urgency and frequency.   Physical Exam   Blood pressure 125/82, pulse 88, temperature 98.3 F (36.8 C), temperature source Oral, resp. rate 16, height 5\' 3"  (1.6 m), weight 113.172 kg (249 lb 8 oz), last menstrual period 08/08/2012, not currently breastfeeding.  Physical Exam  Nursing note and vitals reviewed. Constitutional: She is oriented to person, place, and time. She appears well-developed and well-nourished. No distress.  Cardiovascular: Normal rate.   Respiratory: Effort normal.  Genitourinary:    External: no lesion Vagina: thin white discharge  Neurological: She is alert and oriented to person, place, and time.  Skin: Skin is warm and dry.  Psychiatric: She has a normal mood and affect.    MAU Course  Procedures  Results for orders placed during the hospital encounter of 08/14/12 (from the past 24 hour(s))  URINALYSIS, ROUTINE W REFLEX MICROSCOPIC     Status: Abnormal   Collection Time    08/14/12  7:07 PM      Result Value Range   Color, Urine YELLOW  YELLOW   APPearance CLEAR  CLEAR   Specific Gravity, Urine >1.030 (*) 1.005 - 1.030   pH 5.5  5.0 - 8.0   Glucose, UA NEGATIVE  NEGATIVE mg/dL   Hgb urine dipstick NEGATIVE  NEGATIVE   Bilirubin Urine NEGATIVE  NEGATIVE   Ketones, ur 15 (*) NEGATIVE mg/dL   Protein, ur NEGATIVE  NEGATIVE mg/dL   Urobilinogen, UA 0.2  0.0 - 1.0 mg/dL   Nitrite NEGATIVE  NEGATIVE   Leukocytes, UA TRACE (*) NEGATIVE  URINE MICROSCOPIC-ADD ON     Status: Abnormal   Collection Time    08/14/12  7:07 PM      Result Value Range   Squamous Epithelial / LPF MANY (*) RARE   WBC, UA 7-10  <3 WBC/hpf   RBC / HPF 0-2  <3 RBC/hpf   Bacteria, UA FEW (*) RARE  Urine-Other MUCOUS PRESENT    POCT PREGNANCY, URINE     Status: None   Collection Time    08/14/12  7:28 PM      Result Value Range   Preg Test, Ur NEGATIVE  NEGATIVE  WET PREP, GENITAL     Status: Abnormal   Collection Time    08/14/12  8:00 PM      Result Value Range   Yeast Wet Prep HPF POC NONE SEEN  NONE SEEN   Trich, Wet Prep NONE SEEN  NONE SEEN   Clue Cells Wet Prep HPF POC FEW (*) NONE SEEN   WBC, Wet Prep HPF POC FEW (*) NONE SEEN    Assessment and Plan   1. BV (bacterial vaginosis)    RX: flagyl 500 mg BID X 7 days #14 with 0RF Return to MAU as needed or if sx worsen.   Tawnya Crook 08/14/2012, 8:08 PM

## 2012-08-15 LAB — URINE CULTURE
Colony Count: NO GROWTH
Culture: NO GROWTH

## 2012-10-03 ENCOUNTER — Ambulatory Visit: Payer: 59 | Admitting: Family

## 2013-03-10 ENCOUNTER — Encounter (HOSPITAL_COMMUNITY): Payer: Self-pay | Admitting: Emergency Medicine

## 2013-03-10 ENCOUNTER — Emergency Department (HOSPITAL_COMMUNITY)
Admission: EM | Admit: 2013-03-10 | Discharge: 2013-03-11 | Disposition: A | Discharge status: Home / Self Care | Payer: 59 | Attending: Emergency Medicine | Admitting: Emergency Medicine

## 2013-03-10 DIAGNOSIS — Z3202 Encounter for pregnancy test, result negative: Secondary | ICD-10-CM | POA: Insufficient documentation

## 2013-03-10 DIAGNOSIS — K5900 Constipation, unspecified: Secondary | ICD-10-CM | POA: Insufficient documentation

## 2013-03-10 DIAGNOSIS — R1013 Epigastric pain: Secondary | ICD-10-CM | POA: Insufficient documentation

## 2013-03-10 DIAGNOSIS — R1012 Left upper quadrant pain: Secondary | ICD-10-CM | POA: Insufficient documentation

## 2013-03-10 DIAGNOSIS — R109 Unspecified abdominal pain: Secondary | ICD-10-CM

## 2013-03-10 DIAGNOSIS — Z79899 Other long term (current) drug therapy: Secondary | ICD-10-CM | POA: Insufficient documentation

## 2013-03-10 DIAGNOSIS — R112 Nausea with vomiting, unspecified: Secondary | ICD-10-CM | POA: Insufficient documentation

## 2013-03-10 DIAGNOSIS — I10 Essential (primary) hypertension: Secondary | ICD-10-CM | POA: Insufficient documentation

## 2013-03-10 DIAGNOSIS — R1011 Right upper quadrant pain: Secondary | ICD-10-CM | POA: Insufficient documentation

## 2013-03-10 LAB — CBC WITH DIFFERENTIAL/PLATELET
Basophils Absolute: 0 10*3/uL (ref 0.0–0.1)
Basophils Relative: 0 % (ref 0–1)
Eosinophils Absolute: 0.2 10*3/uL (ref 0.0–0.7)
Eosinophils Relative: 2 % (ref 0–5)
HCT: 37.3 % (ref 36.0–46.0)
Hemoglobin: 12.7 g/dL (ref 12.0–15.0)
Lymphocytes Relative: 32 % (ref 12–46)
Lymphs Abs: 2.9 10*3/uL (ref 0.7–4.0)
MCH: 28.2 pg (ref 26.0–34.0)
MCHC: 34 g/dL (ref 30.0–36.0)
MCV: 82.7 fL (ref 78.0–100.0)
Monocytes Absolute: 0.6 10*3/uL (ref 0.1–1.0)
Monocytes Relative: 7 % (ref 3–12)
Neutro Abs: 5.4 10*3/uL (ref 1.7–7.7)
Neutrophils Relative %: 60 % (ref 43–77)
Platelets: 341 10*3/uL (ref 150–400)
RBC: 4.51 MIL/uL (ref 3.87–5.11)
RDW: 13.8 % (ref 11.5–15.5)
WBC: 9.1 10*3/uL (ref 4.0–10.5)

## 2013-03-10 LAB — URINALYSIS, ROUTINE W REFLEX MICROSCOPIC
Glucose, UA: NEGATIVE mg/dL
Hgb urine dipstick: NEGATIVE
Ketones, ur: >80 mg/dL — AB
Nitrite: NEGATIVE
Protein, ur: NEGATIVE mg/dL
Specific Gravity, Urine: 1.039 — ABNORMAL HIGH (ref 1.005–1.030)
Urobilinogen, UA: 0.2 mg/dL (ref 0.0–1.0)
pH: 5.5 (ref 5.0–8.0)

## 2013-03-10 LAB — COMPREHENSIVE METABOLIC PANEL
ALT: 13 U/L (ref 0–35)
AST: 16 U/L (ref 0–37)
Albumin: 3.6 g/dL (ref 3.5–5.2)
Alkaline Phosphatase: 106 U/L (ref 39–117)
BUN: 10 mg/dL (ref 6–23)
CO2: 22 mEq/L (ref 19–32)
Calcium: 8.9 mg/dL (ref 8.4–10.5)
Chloride: 101 mEq/L (ref 96–112)
Creatinine, Ser: 0.68 mg/dL (ref 0.50–1.10)
GFR calc Af Amer: >90 mL/min (ref >90)
GFR calc non Af Amer: >90 mL/min (ref >90)
Glucose, Bld: 85 mg/dL (ref 70–99)
Potassium: 3.3 mEq/L — ABNORMAL LOW (ref 3.5–5.1)
Sodium: 135 mEq/L (ref 135–145)
Total Bilirubin: 0.6 mg/dL (ref 0.3–1.2)
Total Protein: 8.3 g/dL (ref 6.0–8.3)

## 2013-03-10 LAB — PREGNANCY, URINE: Preg Test, Ur: NEGATIVE

## 2013-03-10 LAB — URINE MICROSCOPIC-ADD ON

## 2013-03-10 LAB — LIPASE, BLOOD: Lipase: 28 U/L (ref 11–59)

## 2013-03-10 MED ORDER — SODIUM CHLORIDE 0.9 % IV BOLUS (SEPSIS)
2000.0000 mL | Freq: Once | INTRAVENOUS | Status: AC
  Administered 2013-03-10: 2000 mL via INTRAVENOUS

## 2013-03-10 MED ORDER — ONDANSETRON 4 MG PO TBDP
4.0000 mg | ORAL_TABLET | Freq: Once | ORAL | Status: AC
  Administered 2013-03-10: 4 mg via ORAL
  Filled 2013-03-10: qty 1

## 2013-03-10 NOTE — ED Notes (Signed)
Pt has chronic abdominal issues including pain, nausea, alternating constipation/diarrhea.  Pt presents to ED tonight d/t pain in upper abdomen becoming unbearable. Pt rates pain 8/10 in upper abdomen, tight in nature.

## 2013-03-10 NOTE — ED Notes (Signed)
Pt presents with NAD- Pt c/o of stomach pain upper quads only for yeasts since birth of child. Increased nausea after eating and bloating with eating

## 2013-03-10 NOTE — ED Provider Notes (Signed)
CSN: [id number]     Arrival date & time 03/10/13  2124 History   First MD Initiated Contact with Patient 03/10/13 2236     Chief Complaint  Patient presents with  . Abdominal Pain  . Nausea   (Consider location/radiation/quality/duration/timing/severity/associated sxs/prior Treatment) HPI Comments: Patient presents with complaint of bilateral upper abdominal pain that is intermittent and has been occurring intermittently for months to years. Patient states that approximately 2 weeks  During the month she will have this pain. Episodes occur at all hours of the day. When she gets it, it is constant described as a cramping. It is associated with nausea after eating and occasional episodes of vomiting. When patient has this pain she avoids foods and liquids. Pain does not radiate. She typically has constipation with these episodes. No diarrhea or urinary symptoms. She has taken Colace without relief. No other treatments. She's never been seen by a primary care doctor or gastroenterologist for the symptoms. She denies heavy alcohol or NSAID use. The onset of this condition was acute. The course is constant. Aggravating factors: none. Alleviating factors: none.    The history is provided by the patient.    Past Medical History  Diagnosis Date  . As child and both parents have hypertension but none now    Past Surgical History  Procedure Laterality Date  . Cesarean section  10/21/2010    Procedure: CESAREAN SECTION;  Surgeon: Jessee Avers;  Location: [facility];  Service: Gynecology;  Laterality: N/A;   Family History  Problem Relation Age of Onset  . Hypertension Mother   . Hypertension Father    History  Substance Use Topics  . Smoking status: Never Smoker   . Smokeless tobacco: Not on file  . Alcohol Use: No   OB History   Grav Para Term Preterm Abortions TAB SAB Ect Mult Living   4 1 1  3 1 2   1      Review of Systems  Constitutional: Negative for fever.  HENT: Negative for  rhinorrhea and sore throat.   Eyes: Negative for redness.  Respiratory: Negative for cough.   Cardiovascular: Negative for chest pain.  Gastrointestinal: Positive for nausea, vomiting, abdominal pain and constipation. Negative for diarrhea.  Genitourinary: Negative for dysuria.  Musculoskeletal: Negative for myalgias.  Skin: Negative for rash.  Neurological: Negative for headaches.    Allergies  Review of patient's allergies indicates no known allergies.  Home Medications   Current Outpatient Rx  Name  Route  Sig  Dispense  Refill  . docusate sodium (COLACE) 100 MG capsule   Oral   Take 400 mg by mouth once as needed for mild constipation.         Marland Kitchen omeprazole (PRILOSEC) 20 MG capsule   Oral   Take 1 capsule (20 mg total) by mouth daily.   30 capsule   0   . ondansetron (ZOFRAN ODT) 4 MG disintegrating tablet   Oral   Take 1 tablet (4 mg total) by mouth every 8 (eight) hours as needed for nausea or vomiting.   10 tablet   0   . sucralfate (CARAFATE) 1 G tablet   Oral   Take 1 tablet (1 g total) by mouth 4 (four) times daily -  with meals and at bedtime.   60 tablet   0    BP 165/74  Pulse 75  Temp(Src) 98.1 F (36.7 C) (Oral)  Resp 16  Ht 5\' 3"  (1.6 m)  Wt 240 lb (  108.863 kg)  BMI 42.52 kg/m2  SpO2 99%  LMP 02/20/2013 Physical Exam  Nursing note and vitals reviewed. Constitutional: She appears well-developed and well-nourished.  HENT:  Head: Normocephalic and atraumatic.  Eyes: Conjunctivae are normal. Right eye exhibits no discharge. Left eye exhibits no discharge.  Neck: Normal range of motion. Neck supple.  Cardiovascular: Normal rate, regular rhythm and normal heart sounds.   No murmur heard. Pulmonary/Chest: Effort normal and breath sounds normal. No respiratory distress. She has no wheezes. She has no rales.  Abdominal: Soft. She exhibits no distension. There is tenderness (mild) in the right upper quadrant, epigastric area and left upper quadrant.  There is no rigidity, no rebound, no guarding, no CVA tenderness, no tenderness at McBurney's point and negative Murphy's sign.  Neurological: She is alert.  Skin: Skin is warm and dry.  Psychiatric: She has a normal mood and affect.    ED Course  Procedures (including critical care time) Labs Review Labs Reviewed  COMPREHENSIVE METABOLIC PANEL - Abnormal; Notable for the following:    Potassium 3.3 (*)    All other components within normal limits  URINALYSIS, ROUTINE W REFLEX MICROSCOPIC - Abnormal; Notable for the following:    APPearance CLOUDY (*)    Specific Gravity, Urine 1.039 (*)    Bilirubin Urine SMALL (*)    Ketones, ur >80 (*)    Leukocytes, UA TRACE (*)    All other components within normal limits  URINE MICROSCOPIC-ADD ON - Abnormal; Notable for the following:    Squamous Epithelial / LPF FEW (*)    All other components within normal limits  CBC WITH DIFFERENTIAL  LIPASE, BLOOD  PREGNANCY, URINE   Imaging Review No results found.  EKG Interpretation   None      11:07 PM Patient seen and examined. Work-up reviewed. Medications ordered.   Vital signs reviewed and are as follows: Filed Vitals:   03/10/13 2158  BP: 165/74  Pulse: 75  Temp: 98.1 F (36.7 C)  Resp: 16   11:18 PM Discussed results with patient. She is dehydrated with high specific gravity and >80 ketones. Will rehydrate and likely d/c to home with PCP follow-up that she has scheduled with [place] in early January.   12:51 AM Fluids completed. Ready for d/c.   The patient was urged to return to the Emergency Department immediately with worsening of current symptoms, worsening abdominal pain, persistent vomiting, blood noted in stools, fever, or any other concerns. The patient verbalized understanding.     MDM   1. Abdominal pain    Patient with intermittent upper abd pain for years. Ddx includes gastritis, PUD, GERD, cholelithiasis, IBS, absorption problem. No emergent conditions  suspected. Dehydration treated with 2000cc NS in ED. Will start PPI, carafate, antiemetic. Patient to f/u with PCP, may need GI f/u if symptoms do not improve. She appears well, non-toxic. Exam unchanged during ED. Abd remains soft.     Renne Crigler, PA-C 03/11/13 [phone]

## 2013-03-11 MED ORDER — OMEPRAZOLE 20 MG PO CPDR: 20.0000 mg | DELAYED_RELEASE_CAPSULE | Freq: Every day | ORAL | Status: DC

## 2013-03-11 MED ORDER — ONDANSETRON 4 MG PO TBDP: 4.0000 mg | ORAL_TABLET | Freq: Three times a day (TID) | ORAL | Status: DC | PRN

## 2013-03-11 MED ORDER — SUCRALFATE 1 G PO TABS: 1.0000 g | ORAL_TABLET | Freq: Three times a day (TID) | ORAL | Status: DC

## 2013-03-11 NOTE — ED Provider Notes (Signed)
Medical screening examination/treatment/procedure(s) were performed by non-physician practitioner and as supervising physician I was immediately available for consultation/collaboration.  EKG Interpretation   None         Blanchie Dessert, MD 03/11/13 0505

## 2013-03-25 ENCOUNTER — Other Ambulatory Visit: Payer: Self-pay | Admitting: Gastroenterology

## 2013-03-25 DIAGNOSIS — R1013 Epigastric pain: Secondary | ICD-10-CM

## 2013-03-29 ENCOUNTER — Ambulatory Visit
Admission: RE | Admit: 2013-03-29 | Discharge: 2013-03-29 | Disposition: A | Discharge status: Home / Self Care | Payer: 59 | Source: Ambulatory Visit | Attending: Gastroenterology | Admitting: Gastroenterology

## 2013-03-29 DIAGNOSIS — R1013 Epigastric pain: Secondary | ICD-10-CM

## 2013-03-29 MED ORDER — IOHEXOL 300 MG/ML  SOLN
125.0000 mL | Freq: Once | INTRAMUSCULAR | Status: AC | PRN
  Administered 2013-03-29: 125 mL via INTRAVENOUS

## 2013-04-30 ENCOUNTER — Emergency Department (HOSPITAL_COMMUNITY)
Admission: EM | Admit: 2013-04-30 | Discharge: 2013-05-01 | Disposition: A | Discharge status: Home / Self Care | Payer: 59 | Attending: Emergency Medicine | Admitting: Emergency Medicine

## 2013-04-30 DIAGNOSIS — K8020 Calculus of gallbladder without cholecystitis without obstruction: Secondary | ICD-10-CM

## 2013-04-30 DIAGNOSIS — R1010 Upper abdominal pain, unspecified: Secondary | ICD-10-CM

## 2013-04-30 DIAGNOSIS — K8050 Calculus of bile duct without cholangitis or cholecystitis without obstruction: Secondary | ICD-10-CM

## 2013-04-30 DIAGNOSIS — O9989 Other specified diseases and conditions complicating pregnancy, childbirth and the puerperium: Secondary | ICD-10-CM | POA: Insufficient documentation

## 2013-04-30 DIAGNOSIS — R109 Unspecified abdominal pain: Secondary | ICD-10-CM | POA: Insufficient documentation

## 2013-05-01 ENCOUNTER — Emergency Department (HOSPITAL_COMMUNITY): Payer: 59

## 2013-05-01 ENCOUNTER — Encounter (HOSPITAL_COMMUNITY): Payer: Self-pay | Admitting: Emergency Medicine

## 2013-05-01 LAB — COMPREHENSIVE METABOLIC PANEL
ALT: 10 U/L (ref 0–35)
AST: 15 U/L (ref 0–37)
Albumin: 3.5 g/dL (ref 3.5–5.2)
Alkaline Phosphatase: 94 U/L (ref 39–117)
BUN: 8 mg/dL (ref 6–23)
CO2: 22 mEq/L (ref 19–32)
Calcium: 8.7 mg/dL (ref 8.4–10.5)
Chloride: 101 mEq/L (ref 96–112)
Creatinine, Ser: 0.6 mg/dL (ref 0.50–1.10)
GFR calc Af Amer: >90 mL/min (ref >90)
GFR calc non Af Amer: >90 mL/min (ref >90)
Glucose, Bld: 86 mg/dL (ref 70–99)
Potassium: 3.7 mEq/L (ref 3.7–5.3)
Sodium: 136 mEq/L — ABNORMAL LOW (ref 137–147)
Total Bilirubin: 0.3 mg/dL (ref 0.3–1.2)
Total Protein: 8.1 g/dL (ref 6.0–8.3)

## 2013-05-01 LAB — PREGNANCY, URINE: Preg Test, Ur: POSITIVE — AB

## 2013-05-01 LAB — CBC
HCT: 33.4 % — ABNORMAL LOW (ref 36.0–46.0)
Hemoglobin: 11.1 g/dL — ABNORMAL LOW (ref 12.0–15.0)
MCH: 27.3 pg (ref 26.0–34.0)
MCHC: 33.2 g/dL (ref 30.0–36.0)
MCV: 82.1 fL (ref 78.0–100.0)
Platelets: 321 10*3/uL (ref 150–400)
RBC: 4.07 MIL/uL (ref 3.87–5.11)
RDW: 14.1 % (ref 11.5–15.5)
WBC: 10.4 10*3/uL (ref 4.0–10.5)

## 2013-05-01 LAB — LIPASE, BLOOD: Lipase: 30 U/L (ref 11–59)

## 2013-05-01 MED ORDER — HYDROMORPHONE HCL PF 1 MG/ML IJ SOLN
1.0000 mg | Freq: Once | INTRAMUSCULAR | Status: DC
  Filled 2013-05-01: qty 1

## 2013-05-01 MED ORDER — ONDANSETRON 8 MG PO TBDP
8.0000 mg | ORAL_TABLET | Freq: Once | ORAL | Status: AC
  Administered 2013-05-01: 8 mg via ORAL
  Filled 2013-05-01: qty 1

## 2013-05-01 MED ORDER — ONDANSETRON HCL 8 MG PO TABS: 8.0000 mg | ORAL_TABLET | Freq: Three times a day (TID) | ORAL | Status: DC | PRN

## 2013-05-01 MED ORDER — HYDROCODONE-ACETAMINOPHEN 5-325 MG PO TABS: 1.0000 | ORAL_TABLET | Freq: Four times a day (QID) | ORAL | Status: DC | PRN

## 2013-05-01 NOTE — ED Notes (Addendum)
Pt states that she has had ongoing problems for several months with pain in LUQ with eating that radiates to back. States she frequently vomits and is nauseated. Recently saw Dr. Elnoria HowardHung with GI and had a CT scan stating that her intestines were inflamed. Pt took home pregnancy test 3 days ago and it was positive. States that these problems were present before.

## 2013-05-01 NOTE — Discharge Instructions (Signed)
Rest. Drink plenty of fluids. Take zofran as need for nausea. You may take hydrocodone as need for pain. No driving when taking hydrocodone. Also, do not take tylenol or acetaminophen containing medication when taking hydrocodone. As your pregnancy test is positive, limit the use of hydrocodone as much as possible. For gallstones, follow up with general surgeon in the coming week  - call office to arrange appointment. For positive pregnancy test, follow up with ob/gyn doctor in the next 1-2 weeks.  Return to ER if worse, worsening or intractable pain, persistent vomiting, fevers, other concern. If pelvic pain, or vaginal bleeding, go to [facility].      Biliary Colic  Biliary colic is a steady or irregular pain in the upper abdomen. It is usually under the right side of the rib cage. It happens when gallstones interfere with the normal flow of bile from the gallbladder. Bile is a liquid that helps to digest fats. Bile is made in the liver and stored in the gallbladder. When you eat a meal, bile passes from the gallbladder through the cystic duct and the common bile duct into the small intestine. There, it mixes with partially digested food. If a gallstone blocks either of these ducts, the normal flow of bile is blocked. The muscle cells in the bile duct contract forcefully to try to move the stone. This causes the pain of biliary colic.  SYMPTOMS   A person with biliary colic usually complains of pain in the upper abdomen. This pain can be:  In the center of the upper abdomen just below the breastbone.  In the upper-right part of the abdomen, near the gallbladder and liver.  Spread back toward the right shoulder blade.  Nausea and vomiting.  The pain usually occurs after eating.  Biliary colic is usually triggered by the digestive system's demand for bile. The demand for bile is high after fatty meals. Symptoms can also occur when a person who has been fasting suddenly eats a very  large meal. Most episodes of biliary colic pass after 1 to 5 hours. After the most intense pain passes, your abdomen may continue to ache mildly for about 24 hours. DIAGNOSIS  After you describe your symptoms, your caregiver will perform a physical exam. He or she will pay attention to the upper right portion of your belly (abdomen). This is the area of your liver and gallbladder. An ultrasound will help your caregiver look for gallstones. Specialized scans of the gallbladder may also be done. Blood tests may be done, especially if you have fever or if your pain persists. PREVENTION  Biliary colic can be prevented by controlling the risk factors for gallstones. Some of these risk factors, such as heredity, increasing age, and pregnancy are a normal part of life. Obesity and a high-fat diet are risk factors you can change through a healthy lifestyle. Women going through menopause who take hormone replacement therapy (estrogen) are also more likely to develop biliary colic. TREATMENT   Pain medication may be prescribed.  You may be encouraged to eat a fat-free diet.  If the first episode of biliary colic is severe, or episodes of colic keep retuning, surgery to remove the gallbladder (cholecystectomy) is usually recommended. This procedure can be done through small incisions using an instrument called a laparoscope. The procedure often requires a brief stay in the hospital. Some people can leave the hospital the same day. It is the most widely used treatment in people troubled by painful gallstones. It is  effective and safe, with no complications in more than 90% of cases.  If surgery cannot be done, medication that dissolves gallstones may be used. This medication is expensive and can take months or years to work. Only small stones will dissolve.  Rarely, medication to dissolve gallstones is combined with a procedure called shock-wave lithotripsy. This procedure uses carefully aimed shock waves to  break up gallstones. In many people treated with this procedure, gallstones form again within a few years. PROGNOSIS  If gallstones block your cystic duct or common bile duct, you are at risk for repeated episodes of biliary colic. There is also a 25% chance that you will develop a gallbladder infection(acute cholecystitis), or some other complication of gallstones within 10 to 20 years. If you have surgery, schedule it at a time that is convenient for you and at a time when you are not sick. HOME CARE INSTRUCTIONS   Drink plenty of clear fluids.  Avoid fatty, greasy or fried foods, or any foods that make your pain worse.  Take medications as directed. SEEK MEDICAL CARE IF:   You develop a fever over 100.5 F (38.1 C).  Your pain gets worse over time.  You develop nausea that prevents you from eating and drinking.  You develop vomiting. SEEK IMMEDIATE MEDICAL CARE IF:   You have continuous or severe belly (abdominal) pain which is not relieved with medications.  You develop nausea and vomiting which is not relieved with medications.  You have symptoms of biliary colic and you suddenly develop a fever and shaking chills. This may signal cholecystitis. Call your caregiver immediately.  You develop a yellow color to your skin or the white part of your eyes (jaundice). Document Released: 08/15/2005 Document Revised: 06/06/2011 Document Reviewed: 10/25/2007 [facility] Patient Information 2014 [place].     Cholelithiasis Cholelithiasis (also called gallstones) is a form of gallbladder disease in which gallstones form in your gallbladder. The gallbladder is an organ that stores bile made in the liver, which helps digest fats. Gallstones begin as small crystals and slowly grow into stones. Gallstone pain occurs when the gallbladder spasms and a gallstone is blocking the duct. Pain can also occur when a stone passes out of the duct.  RISK FACTORS  Being female.   Having  multiple pregnancies. Health care providers sometimes advise removing diseased gallbladders before future pregnancies.   Being obese.  Eating a diet heavy in fried foods and fat.   Being older than 3 years and increasing age.   Prolonged use of medicines containing female hormones.   Having diabetes mellitus.   Rapidly losing weight.   Having a family history of gallstones (heredity).  SYMPTOMS  Nausea.   Vomiting.  Abdominal pain.   Yellowing of the skin (jaundice).   Sudden pain. It may persist from several minutes to several hours.  Fever.   Tenderness to the touch. In some cases, when gallstones do not move into the bile duct, people have no pain or symptoms. These are called "silent" gallstones.  TREATMENT Silent gallstones do not need treatment. In severe cases, emergency surgery may be required. Options for treatment include:  Surgery to remove the gallbladder. This is the most common treatment.  Medicines. These do not always work and may take 6 12 months or more to work.  Shock wave treatment (extracorporeal biliary lithotripsy). In this treatment an ultrasound machine sends shock waves to the gallbladder to break gallstones into smaller pieces that can pass into the intestines or be  dissolved by medicine. HOME CARE INSTRUCTIONS   Only take over-the-counter or prescription medicines for pain, discomfort, or fever as directed by your health care provider.   Follow a low-fat diet until seen again by your health care provider. Fat causes the gallbladder to contract, which can result in pain.   Follow up with your health care provider as directed. Attacks are almost always recurrent and surgery is usually required for permanent treatment.  SEEK IMMEDIATE MEDICAL CARE IF:   Your pain increases and is not controlled by medicines.   You have a fever or persistent symptoms for more than 2 3 days.   You have a fever and your symptoms suddenly get  worse.   You have persistent nausea and vomiting.  MAKE SURE YOU:   Understand these instructions.  Will watch your condition.  Will get help right away if you are not doing well or get worse. Document Released: 03/10/2005 Document Revised: 11/14/2012 Document Reviewed: 09/05/2012 [facility] Patient Information 2014 [place].

## 2013-05-01 NOTE — ED Provider Notes (Signed)
CSN: [id number]     Arrival date & time 04/30/13  2356 History   First MD Initiated Contact with Patient 05/01/13 0028     Chief Complaint  Patient presents with  . Abdominal Pain   (Consider location/radiation/quality/duration/timing/severity/associated sxs/prior Treatment) Patient is a 23 y.o. female presenting with abdominal pain. The history is provided by the patient.  Abdominal Pain Associated symptoms: nausea   Associated symptoms: no chest pain, no chills, no cough, no diarrhea, no dysuria, no fever, no shortness of breath, no sore throat, no vaginal bleeding, no vaginal discharge and no vomiting   pt c/o upper abdominal pain intermittently for the past several months. Dull, cramping, aching, occasionally radiating through to midback/midscapular area. Lasts minutes to hours. At rest. No relation to activity or exertion. No relation to eating or certain foods. Nausea. No vomiting or diarrhea, although had a loose bm today. Normal appetite. No recent wt change. No associated diaphoresis or sob. No constant or pleuritic pain. No gu c/o. No mid to lower abd pain. No vaginal d/c or bleeding. lnmp 1 month ago. Did take home preg test this week pos. Only prior abd surgery, c section. No hx ibs or IBD. Denies fever or chills. Denies hx pud, pancreatitis or gallstones. States saw gi for same last month, no specific dx, states had ct scan done.      Past Medical History  Diagnosis Date  . As child and both parents have hypertension but none now    Past Surgical History  Procedure Laterality Date  . Cesarean section  10/21/2010    Procedure: CESAREAN SECTION;  Surgeon: Catha Brow;  Location: [facility];  Service: Gynecology;  Laterality: N/A;   Family History  Problem Relation Age of Onset  . Hypertension Mother   . Hypertension Father    History  Substance Use Topics  . Smoking status: Never Smoker   . Smokeless tobacco: Not on file  . Alcohol Use: No   OB History   Grav Para Term  Preterm Abortions TAB SAB Ect Mult Living   5 1 1  3 1 2   1      Review of Systems  Constitutional: Negative for fever and chills.  HENT: Negative for sore throat.   Eyes: Negative for redness.  Respiratory: Negative for cough and shortness of breath.   Cardiovascular: Negative for chest pain and leg swelling.  Gastrointestinal: Positive for nausea and abdominal pain. Negative for vomiting and diarrhea.  Genitourinary: Negative for dysuria, flank pain, vaginal bleeding and vaginal discharge.  Musculoskeletal: Negative for back pain and neck pain.  Skin: Negative for rash.  Neurological: Negative for headaches.  Hematological: Does not bruise/bleed easily.  Psychiatric/Behavioral: Negative for confusion.    Allergies  Review of patient's allergies indicates no known allergies.  Home Medications  No current outpatient prescriptions on file. BP 142/85  Pulse 79  Temp(Src) 97.8 F (36.6 C) (Oral)  SpO2 100%  LMP 03/12/2013 Physical Exam  Nursing note and vitals reviewed. Constitutional: She appears well-developed and well-nourished. No distress.  HENT:  Mouth/Throat: Oropharynx is clear and moist.  Eyes: Conjunctivae are normal. No scleral icterus.  Neck: Neck supple. No tracheal deviation present. No thyromegaly present.  Cardiovascular: Normal rate, regular rhythm and normal heart sounds.  Exam reveals no gallop and no friction rub.   No murmur heard. Pulmonary/Chest: Effort normal and breath sounds normal. No respiratory distress.  Abdominal: Soft. Normal appearance and bowel sounds are normal. She exhibits no distension and no  mass. There is no tenderness. There is no rebound and no guarding.  Obese.  No incarc hernia.   Genitourinary:  No cva tenderness  Musculoskeletal: She exhibits no edema.  Neurological: She is alert.  Skin: Skin is warm and dry. No rash noted. She is not diaphoretic.  Psychiatric: She has a normal mood and affect.    ED Course  Procedures  (including critical care time)  Results for orders placed during the hospital encounter of 04/30/13  CBC      Result Value Range   WBC 10.4  4.0 - 10.5 K/uL   RBC 4.07  3.87 - 5.11 MIL/uL   Hemoglobin 11.1 (*) 12.0 - 15.0 g/dL   HCT 33.4 (*) 36.0 - 46.0 %   MCV 82.1  78.0 - 100.0 fL   MCH 27.3  26.0 - 34.0 pg   MCHC 33.2  30.0 - 36.0 g/dL   RDW 14.1  11.5 - 15.5 %   Platelets 321  150 - 400 K/uL  COMPREHENSIVE METABOLIC PANEL      Result Value Range   Sodium 136 (*) 137 - 147 mEq/L   Potassium 3.7  3.7 - 5.3 mEq/L   Chloride 101  96 - 112 mEq/L   CO2 22  19 - 32 mEq/L   Glucose, Bld 86  70 - 99 mg/dL   BUN 8  6 - 23 mg/dL   Creatinine, Ser 0.60  0.50 - 1.10 mg/dL   Calcium 8.7  8.4 - 10.5 mg/dL   Total Protein 8.1  6.0 - 8.3 g/dL   Albumin 3.5  3.5 - 5.2 g/dL   AST 15  0 - 37 U/L   ALT 10  0 - 35 U/L   Alkaline Phosphatase 94  39 - 117 U/L   Total Bilirubin 0.3  0.3 - 1.2 mg/dL   GFR calc non Af Amer >90  >90 mL/min   GFR calc Af Amer >90  >90 mL/min  LIPASE, BLOOD      Result Value Range   Lipase 30  11 - 59 U/L  PREGNANCY, URINE      Result Value Range   Preg Test, Ur POSITIVE (*) NEGATIVE   US Abdomen Complete  05/01/2013   CLINICAL DATA:  Left upper abdominal pain.  EXAM: ULTRASOUND ABDOMEN COMPLETE  COMPARISON:  CT abdomen and pelvis 03/29/2013.  FINDINGS: Gallbladder:  The gallbladder is filled with stones but there is no gallbladder wall thickening or pericholecystic fluid. Sonographer reports negative Murphy's sign.  Common bile duct:  Diameter: 0.4 cm.  Liver:  No focal lesion identified. Within normal limits in parenchymal echogenicity.  IVC:  No abnormality visualized.  Pancreas:  Visualized portion unremarkable.  Spleen:  Size and appearance within normal limits.  Right Kidney:  Length: 10.3 cm. Echogenicity within normal limits. No mass or hydronephrosis visualized.  Left Kidney:  Length: 9.5 cm. Echogenicity within normal limits. No mass or hydronephrosis  visualized.  Abdominal aorta:  No aneurysm visualized.  Other findings:  None.  IMPRESSION: Gallstones without evidence of cholecystitis. The examination is otherwise negative.   Electronically Signed   By: Inge Rise M.D.   On: 05/01/2013 01:44     MDM  Pt has ride, does not have to drive.   zofran po.  Reviewed nursing notes and prior charts for additional history.   U/s c/w gallstones which fits w pts symptoms/presentation.   w zofran nausea resolved. On recheck no pain. abd soft nt.  u preg pos.  Pts symptoms have been present x months, and all upper quadrant to midscapula.  Pt denies any mid to lower abd or pelvic pain or cramping. No gu symptoms.   Pt declines wanting any pain med in ED, symptoms improved/resolved. Requests rx for home.      Mirna Mires, MD 05/01/13 [phone]

## 2013-05-06 ENCOUNTER — Telehealth (INDEPENDENT_AMBULATORY_CARE_PROVIDER_SITE_OTHER): Payer: Self-pay

## 2013-05-06 ENCOUNTER — Emergency Department (HOSPITAL_COMMUNITY)
Admission: EM | Admit: 2013-05-06 | Discharge: 2013-05-07 | Disposition: A | Discharge status: Home / Self Care | Payer: 59 | Attending: Emergency Medicine | Admitting: Emergency Medicine

## 2013-05-06 ENCOUNTER — Encounter (HOSPITAL_COMMUNITY): Payer: Self-pay | Admitting: Emergency Medicine

## 2013-05-06 DIAGNOSIS — R000 Tachycardia, unspecified: Secondary | ICD-10-CM | POA: Insufficient documentation

## 2013-05-06 DIAGNOSIS — O9989 Other specified diseases and conditions complicating pregnancy, childbirth and the puerperium: Secondary | ICD-10-CM | POA: Insufficient documentation

## 2013-05-06 DIAGNOSIS — R143 Flatulence: Secondary | ICD-10-CM

## 2013-05-06 DIAGNOSIS — Z3490 Encounter for supervision of normal pregnancy, unspecified, unspecified trimester: Secondary | ICD-10-CM

## 2013-05-06 DIAGNOSIS — R630 Anorexia: Secondary | ICD-10-CM | POA: Insufficient documentation

## 2013-05-06 DIAGNOSIS — K8020 Calculus of gallbladder without cholecystitis without obstruction: Secondary | ICD-10-CM | POA: Insufficient documentation

## 2013-05-06 DIAGNOSIS — O219 Vomiting of pregnancy, unspecified: Secondary | ICD-10-CM | POA: Insufficient documentation

## 2013-05-06 DIAGNOSIS — R142 Eructation: Secondary | ICD-10-CM | POA: Insufficient documentation

## 2013-05-06 DIAGNOSIS — Z3202 Encounter for pregnancy test, result negative: Secondary | ICD-10-CM | POA: Insufficient documentation

## 2013-05-06 DIAGNOSIS — O2390 Unspecified genitourinary tract infection in pregnancy, unspecified trimester: Secondary | ICD-10-CM | POA: Insufficient documentation

## 2013-05-06 DIAGNOSIS — R509 Fever, unspecified: Secondary | ICD-10-CM | POA: Insufficient documentation

## 2013-05-06 DIAGNOSIS — N390 Urinary tract infection, site not specified: Secondary | ICD-10-CM | POA: Insufficient documentation

## 2013-05-06 DIAGNOSIS — R141 Gas pain: Secondary | ICD-10-CM | POA: Insufficient documentation

## 2013-05-06 DIAGNOSIS — R1110 Vomiting, unspecified: Secondary | ICD-10-CM

## 2013-05-06 LAB — URINE MICROSCOPIC-ADD ON

## 2013-05-06 LAB — URINALYSIS, ROUTINE W REFLEX MICROSCOPIC
Bilirubin Urine: NEGATIVE
Glucose, UA: NEGATIVE mg/dL
Hgb urine dipstick: NEGATIVE
Ketones, ur: >80 mg/dL — AB
Nitrite: NEGATIVE
Protein, ur: 30 mg/dL — AB
Specific Gravity, Urine: 1.039 — ABNORMAL HIGH (ref 1.005–1.030)
Urobilinogen, UA: 0.2 mg/dL (ref 0.0–1.0)
pH: 6 (ref 5.0–8.0)

## 2013-05-06 LAB — CBC WITH DIFFERENTIAL/PLATELET
Basophils Absolute: 0 10*3/uL (ref 0.0–0.1)
Basophils Relative: 0 % (ref 0–1)
Eosinophils Absolute: 0 10*3/uL (ref 0.0–0.7)
Eosinophils Relative: 0 % (ref 0–5)
HCT: 34.9 % — ABNORMAL LOW (ref 36.0–46.0)
Hemoglobin: 11.9 g/dL — ABNORMAL LOW (ref 12.0–15.0)
Lymphocytes Relative: 10 % — ABNORMAL LOW (ref 12–46)
Lymphs Abs: 1.4 10*3/uL (ref 0.7–4.0)
MCH: 28.1 pg (ref 26.0–34.0)
MCHC: 34.1 g/dL (ref 30.0–36.0)
MCV: 82.3 fL (ref 78.0–100.0)
Monocytes Absolute: 0.5 10*3/uL (ref 0.1–1.0)
Monocytes Relative: 4 % (ref 3–12)
Neutro Abs: 12.1 10*3/uL — ABNORMAL HIGH (ref 1.7–7.7)
Neutrophils Relative %: 87 % — ABNORMAL HIGH (ref 43–77)
Platelets: 331 10*3/uL (ref 150–400)
RBC: 4.24 MIL/uL (ref 3.87–5.11)
RDW: 14.1 % (ref 11.5–15.5)
WBC: 14 10*3/uL — ABNORMAL HIGH (ref 4.0–10.5)

## 2013-05-06 LAB — BASIC METABOLIC PANEL
BUN: 5 mg/dL — ABNORMAL LOW (ref 6–23)
CO2: 21 mEq/L (ref 19–32)
Calcium: 8.8 mg/dL (ref 8.4–10.5)
Chloride: 103 mEq/L (ref 96–112)
Creatinine, Ser: 0.55 mg/dL (ref 0.50–1.10)
GFR calc Af Amer: >90 mL/min (ref >90)
GFR calc non Af Amer: >90 mL/min (ref >90)
Glucose, Bld: 106 mg/dL — ABNORMAL HIGH (ref 70–99)
Potassium: 3.8 mEq/L (ref 3.7–5.3)
Sodium: 138 mEq/L (ref 137–147)

## 2013-05-06 LAB — HEPATIC FUNCTION PANEL
ALT: 11 U/L (ref 0–35)
AST: 17 U/L (ref 0–37)
Albumin: 3.5 g/dL (ref 3.5–5.2)
Alkaline Phosphatase: 92 U/L (ref 39–117)
Bilirubin, Direct: <0.2 mg/dL (ref 0.0–0.3)
Total Bilirubin: 0.5 mg/dL (ref 0.3–1.2)
Total Protein: 8.2 g/dL (ref 6.0–8.3)

## 2013-05-06 LAB — HCG, SERUM, QUALITATIVE: Preg, Serum: POSITIVE — AB

## 2013-05-06 LAB — POCT PREGNANCY, URINE
Preg Test, Ur: POSITIVE — AB
Preg Test, Ur: POSITIVE — AB

## 2013-05-06 LAB — HCG, QUANTITATIVE, PREGNANCY: hCG, Beta Chain, Quant, S: 42942 m[IU]/mL — ABNORMAL HIGH (ref <5)

## 2013-05-06 LAB — LIPASE, BLOOD: Lipase: 26 U/L (ref 11–59)

## 2013-05-06 MED ORDER — MORPHINE SULFATE 4 MG/ML IJ SOLN
4.0000 mg | Freq: Once | INTRAMUSCULAR | Status: AC
  Administered 2013-05-06: 4 mg via INTRAVENOUS
  Filled 2013-05-06: qty 1

## 2013-05-06 MED ORDER — DEXTROSE 5 % AND 0.9 % NACL IV BOLUS
1000.0000 mL | Freq: Once | INTRAVENOUS | Status: AC
  Administered 2013-05-06: 1000 mL via INTRAVENOUS

## 2013-05-06 MED ORDER — ONDANSETRON 4 MG PO TBDP
8.0000 mg | ORAL_TABLET | Freq: Once | ORAL | Status: AC
  Administered 2013-05-06: 8 mg via ORAL
  Filled 2013-05-06: qty 2

## 2013-05-06 MED ORDER — ONDANSETRON 4 MG PO TBDP
ORAL_TABLET | ORAL | Status: AC
  Filled 2013-05-06: qty 1

## 2013-05-06 NOTE — Telephone Encounter (Signed)
Patient calling in requesting an earlier appointment due to discomfort she's experiencing. Patient appointment has been r/s to 05/07/13 w/Dr. Maisie Fushomas.  Patient advised if symptoms return or worsen to go to the ED for further assessment.  Patient advised to continue taking medication prescribed by ED for nausea, try bland foods, do not eat fatty foods, drink plenty of fluids.  Patient verbalized understanding.

## 2013-05-06 NOTE — ED Provider Notes (Signed)
CSN: [id number]     Arrival date & time 05/06/13  1243 History   First MD Initiated Contact with Patient 05/06/13 1906     Chief Complaint  Patient presents with  . Abdominal Pain    HPI: Ms. Towle is a 23 year old female with no pertinent history who presents with abdominal pain, nausea and vomiting. She has had intermittent right upper quadrant pain for several months. Pain is described as sharp, initially non-radiating, exacerbated by eating, no relieving factors. Pain will typically last for a few hours then resolve spontaneously. She has had associated abdominal bloating and nausea. She was evaluated in our emergency department 5 days ago. Yltrasound revealed cholelithiasis but no other abnormalities. Three days ago she started having constant right upper quadrant pain.  Starting last night she has had greater than 10 episodes of non-bilious, non-bloody emesis. Given her persistent symptoms she presents for evaluation. She has not taken anything at home for pain. She denies diarrhea, vaginal bleeding, vaginal discharge, hematuria or back pain. She does endorse chills and subjective fever but she has not taken her temperature at home.   Past Medical History  Diagnosis Date  . As child and both parents have hypertension but none now    Past Surgical History  Procedure Laterality Date  . Cesarean section  10/21/2010    Procedure: CESAREAN SECTION;  Surgeon: Jessee Avers;  Location: [facility];  Service: Gynecology;  Laterality: N/A;   Family History  Problem Relation Age of Onset  . Hypertension Mother   . Hypertension Father    History  Substance Use Topics  . Smoking status: Never Smoker   . Smokeless tobacco: Not on file  . Alcohol Use: No   OB History   Grav Para Term Preterm Abortions TAB SAB Ect Mult Living   5 1 1  3 1 2   1      Review of Systems  Constitutional: Positive for fever, chills and appetite change (decreased today). Negative for fatigue.  Eyes: Negative for  photophobia and visual disturbance.  Respiratory: Negative for cough and shortness of breath.   Cardiovascular: Negative for chest pain and leg swelling.  Gastrointestinal: Positive for nausea, vomiting and abdominal pain. Negative for diarrhea and constipation.  Genitourinary: Negative for dysuria, frequency and decreased urine volume.  Musculoskeletal: Negative for arthralgias, back pain, gait problem and myalgias.  Skin: Negative for color change and wound.  Neurological: Negative for dizziness, syncope, light-headedness and headaches.  Psychiatric/Behavioral: Negative for confusion and agitation.  All other systems reviewed and are negative.      Allergies  Review of patient's allergies indicates no known allergies.  Home Medications   Current Outpatient Rx  Name  Route  Sig  Dispense  Refill  . acetaminophen (TYLENOL) 500 MG tablet   Oral   Take 1,000 mg by mouth every 6 (six) hours as needed for moderate pain.         Marland Kitchen ondansetron (ZOFRAN) 8 MG tablet   Oral   Take 1 tablet (8 mg total) by mouth every 8 (eight) hours as needed for nausea or vomiting.   12 tablet   0    BP 114/53  Pulse 82  Temp(Src) 99 F (37.2 C) (Oral)  Resp 16  SpO2 100%  LMP 03/12/2013 Physical Exam  Nursing note and vitals reviewed. Constitutional: She is oriented to person, place, and time. She appears well-developed and well-nourished. No distress.  HENT:  Head: Normocephalic and atraumatic.  Mouth/Throat: Oropharynx is clear  and moist.  Eyes: Conjunctivae and EOM are normal. Pupils are equal, round, and reactive to light.  Neck: Normal range of motion. Neck supple.  Cardiovascular: Regular rhythm, normal heart sounds and intact distal pulses.  Tachycardia present.   Pulmonary/Chest: Effort normal and breath sounds normal. No respiratory distress.  Abdominal: Soft. Bowel sounds are normal. There is tenderness. There is positive Murphy's sign. There is no rebound, no guarding and no  CVA tenderness.  Musculoskeletal: Normal range of motion. She exhibits no edema and no tenderness.  Neurological: She is alert and oriented to person, place, and time. No cranial nerve deficit. Coordination normal.  Skin: Skin is warm and dry. No rash noted.  Psychiatric: She has a normal mood and affect. Her behavior is normal.    ED Course  Procedures (including critical care time) Labs Review Labs Reviewed  CBC WITH DIFFERENTIAL - Abnormal; Notable for the following:    WBC 14.0 (*)    Hemoglobin 11.9 (*)    HCT 34.9 (*)    Neutrophils Relative % 87 (*)    Neutro Abs 12.1 (*)    Lymphocytes Relative 10 (*)    All other components within normal limits  BASIC METABOLIC PANEL - Abnormal; Notable for the following:    Glucose, Bld 106 (*)    BUN 5 (*)    All other components within normal limits  URINALYSIS, ROUTINE W REFLEX MICROSCOPIC - Abnormal; Notable for the following:    Color, Urine AMBER (*)    APPearance CLOUDY (*)    Specific Gravity, Urine 1.039 (*)    Ketones, ur >80 (*)    Protein, ur 30 (*)    Leukocytes, UA MODERATE (*)    All other components within normal limits  URINE MICROSCOPIC-ADD ON - Abnormal; Notable for the following:    Squamous Epithelial / LPF MANY (*)    Bacteria, UA MANY (*)    All other components within normal limits  HCG, SERUM, QUALITATIVE - Abnormal; Notable for the following:    Preg, Serum POSITIVE (*)    All other components within normal limits  POCT PREGNANCY, URINE - Abnormal; Notable for the following:    Preg Test, Ur POSITIVE (*)    All other components within normal limits  POCT PREGNANCY, URINE - Abnormal; Notable for the following:    Preg Test, Ur POSITIVE (*)    All other components within normal limits  URINE CULTURE  LIPASE, BLOOD  HEPATIC FUNCTION PANEL  HCG, QUANTITATIVE, PREGNANCY   Imaging Review [place] Ob Comp Less 14 Wks  05/07/2013   CLINICAL DATA:  Pregnant, abdomen pain  EXAM: OBSTETRIC <14 WK ULTRASOUND   TECHNIQUE: Transabdominal ultrasound was performed for evaluation of the gestation as well as the maternal uterus and adnexal regions.  COMPARISON:  None.  FINDINGS: Intrauterine gestational sac: Visualized/normal in shape.  Single  Yolk sac:  Present  Embryo:  Present  Cardiac Activity: Present  Heart Rate: 150 bpm  MSD:   mm    w     d  CRL: 11.9 mm 7 w 3 d US EDC: December 21, 2013  Maternal uterus/adnexae: Bilateral ovaries are normal. Trace free fluid is identified.  IMPRESSION: Single live intrauterine gestation without complications.   Electronically Signed   By: Sherian ReinWei-Chen  Lin M.D.   On: 05/07/2013 01:15   [place] Ob Transvaginal  05/07/2013   CLINICAL DATA:  Pregnant, abdomen pain  EXAM: OBSTETRIC <14 WK ULTRASOUND  TECHNIQUE: Transabdominal ultrasound was performed for evaluation  of the gestation as well as the maternal uterus and adnexal regions.  COMPARISON:  None.  FINDINGS: Intrauterine gestational sac: Visualized/normal in shape.  Single  Yolk sac:  Present  Embryo:  Present  Cardiac Activity: Present  Heart Rate: 150 bpm  MSD:   mm    w     d  CRL: 11.9 mm 7 w 3 d [place] EDC: December 21, 2013  Maternal uterus/adnexae: Bilateral ovaries are normal. Trace free fluid is identified.  IMPRESSION: Single live intrauterine gestation without complications.   Electronically Signed   By: Sherian Rein M.D.   On: 05/07/2013 01:15   US Abdomen Limited Ruq  05/07/2013   CLINICAL DATA:  Right upper quadrant pain  EXAM: US ABDOMEN LIMITED - RIGHT UPPER QUADRANT  COMPARISON:  None.  FINDINGS: Gallbladder:  Gallstones are identified. No wall thickening visualized. No sonographic Murphy sign noted.  Common bile duct:  Diameter: 4.7 mm  Liver:  No focal lesion identified. Within normal limits in parenchymal echogenicity.  IMPRESSION: Cholelithiasis without sonographic evidence of acute cholecystitis.   Electronically Signed   By: Sherian Rein M.D.   On: 05/07/2013 01:10    EKG Interpretation   None       MDM     24 year old female presents with right upper quadrant pain, nausea and vomiting. Mildly tachycardic, appears dehydrated on exam.  Positive Murphy's sign on exam. Although she did have a recent ultrasound of her gallbladder, I thought she needed repeat exam now that her pain is constant, she has a leukocytosis to 14 and subjective fever. She has no pregnancy complaints, specifically no lower abdominal pain, vaginal discharge or bleeding. She has no chest pain or shortness of breath to suggest pneumonia or pulmonary embolism. No right lower quadrant tenderness to suggest appendicitis. Electrolytes, creatinine and LFTs are normal. She does have a mild leukocytosis to 14.5, her hemoglobin is normal. Urinalysis is consistent mostly with contamination. Her right upper quadrant ultrasound again demonstrated cholelithiasis but no cholecystitis. Her OB ultrasound confirmed 7+3 week intrauterine gestation without complication. As I have no other explanation for her leukocytosis and she is pregnant, I will treat her for a urinary tract infection with Keflex. Urine culture added.  Her symptoms are likely related to biliary colic. Her pain was controlled with 2 doses of morphine. She had 2 doses of anti-emetics with no further vomiting in the emergency department. She was able to tolerate PO prior to discharge. Remained HD stable. She was provided a prescription for Zofran and Keflex. Advised follow-up with surgery as scheduled. Return precautions were given, patient was in agreement with plan.   Reviewed imaging, labs, previous medical records and utilized in MDM  Discussed case with Dr. Preston Fleeting  Clinical impression 1. Biliary colic 2. Normal intrauterine pregnancy 3. Nausea and vomiting    Margie Billet, MD 05/07/13 2101

## 2013-05-06 NOTE — ED Provider Notes (Signed)
23 year old female comes in because of right upper quadrant pain. She is pregnant and was seen recently for similar complaints an ultrasound did show cholelithiasis. She tells me that pain is worse after eating and especially after eating fatty foods. On exam, there is moderate right upper quadrant tenderness with positive Murphy sign. She got good pain relief with a dose of morphine but pain is recurring. Hepatic function panel is unremarkable. The UVC is moderately elevated and concerning for possible acute cholecystitis. Ultrasound will be obtained to look for evidence of cholecystitis.  I saw and evaluated the patient, reviewed the resident's note and I agree with the findings and plan.   Delora Fuel, MD 56/[id number]/75 [id number]

## 2013-05-06 NOTE — ED Notes (Addendum)
[facility] and was told she has Gall stones she states she has been having same abd pain and vomiting all night has app w/ [facility] on Friday 13 for them to eval her gall bladder also states is itching all over

## 2013-05-07 ENCOUNTER — Ambulatory Visit (INDEPENDENT_AMBULATORY_CARE_PROVIDER_SITE_OTHER): Payer: 59 | Admitting: General Surgery

## 2013-05-07 ENCOUNTER — Emergency Department (HOSPITAL_COMMUNITY): Payer: 59

## 2013-05-07 LAB — URINE CULTURE

## 2013-05-07 MED ORDER — ONDANSETRON 4 MG PO TBDP: 4.0000 mg | ORAL_TABLET | Freq: Three times a day (TID) | ORAL | Status: DC | PRN

## 2013-05-07 MED ORDER — ONDANSETRON HCL 4 MG/2ML IJ SOLN
4.0000 mg | Freq: Once | INTRAMUSCULAR | Status: AC
  Administered 2013-05-07: 4 mg via INTRAVENOUS
  Filled 2013-05-07: qty 2

## 2013-05-07 MED ORDER — CEPHALEXIN 500 MG PO CAPS: 500.0000 mg | ORAL_CAPSULE | Freq: Three times a day (TID) | ORAL | Status: DC

## 2013-05-07 NOTE — ED Notes (Signed)
Patient returning from ultrasound

## 2013-05-07 NOTE — Discharge Instructions (Signed)
Urinary Tract Infection  Urinary tract infections (UTIs) can develop anywhere along your urinary tract. Your urinary tract is your body's drainage system for removing wastes and extra water. Your urinary tract includes two kidneys, two ureters, a bladder, and a urethra. Your kidneys are a pair of bean-shaped organs. Each kidney is about the size of your fist. They are located below your ribs, one on each side of your spine.  CAUSES  Infections are caused by microbes, which are microscopic organisms, including fungi, viruses, and bacteria. These organisms are so small that they can only be seen through a microscope. Bacteria are the microbes that most commonly cause UTIs.  SYMPTOMS   Symptoms of UTIs may vary by age and gender of the patient and by the location of the infection. Symptoms in young women typically include a frequent and intense urge to urinate and a painful, burning feeling in the bladder or urethra during urination. Older women and men are more likely to be tired, shaky, and weak and have muscle aches and abdominal pain. A fever may mean the infection is in your kidneys. Other symptoms of a kidney infection include pain in your back or sides below the ribs, nausea, and vomiting.  DIAGNOSIS  To diagnose a UTI, your caregiver will ask you about your symptoms. Your caregiver also will ask to provide a urine sample. The urine sample will be tested for bacteria and white blood cells. White blood cells are made by your body to help fight infection.  TREATMENT   Typically, UTIs can be treated with medication. Because most UTIs are caused by a bacterial infection, they usually can be treated with the use of antibiotics. The choice of antibiotic and length of treatment depend on your symptoms and the type of bacteria causing your infection.  HOME CARE INSTRUCTIONS   If you were prescribed antibiotics, take them exactly as your caregiver instructs you. Finish the medication even if you feel better after you  have only taken some of the medication.   Drink enough water and fluids to keep your urine clear or pale yellow.   Avoid caffeine, tea, and carbonated beverages. They tend to irritate your bladder.   Empty your bladder often. Avoid holding urine for long periods of time.   Empty your bladder before and after sexual intercourse.   After a bowel movement, women should cleanse from front to back. Use each tissue only once.  SEEK MEDICAL CARE IF:    You have back pain.   You develop a fever.   Your symptoms do not begin to resolve within 3 days.  SEEK IMMEDIATE MEDICAL CARE IF:    You have severe back pain or lower abdominal pain.   You develop chills.   You have nausea or vomiting.   You have continued burning or discomfort with urination.  MAKE SURE YOU:    Understand these instructions.   Will watch your condition.   Will get help right away if you are not doing well or get worse.  Document Released: 12/22/2004 Document Revised: 09/13/2011 Document Reviewed: 04/22/2011  Exit[employer] Patient Information 2014 [employer].  Pregnancy  If you are planning on getting pregnant, it is a good idea to make a preconception appointment with your caregiver to discuss having a healthy lifestyle before getting pregnant. This includes diet, weight, exercise, taking prenatal vitamins (especially folic acid, which helps prevent brain and spinal cord defects), avoiding alcohol, smoking and illegal drugs, medical problems (diabetes, convulsions), family history of genetic   problems, working conditions, and immunizations. It is better to have knowledge of these things and do something about them before getting pregnant.  During your pregnancy, it is important to follow certain guidelines in order to have a healthy baby. It is very important to get good prenatal care and follow your caregiver's instructions. Prenatal care includes all the medical care you receive before your baby's birth. This helps to prevent problems  during the pregnancy and childbirth.  HOME CARE INSTRUCTIONS    Start your prenatal visits by the 12th week of pregnancy or earlier, if possible. At first, appointments are usually scheduled monthly. They become more frequent in the last 2 months before delivery. It is important that you keep your caregiver's appointments and follow your caregiver's instructions regarding medication use, exercise, and diet.   During pregnancy, you are providing food for you and your baby. Eat a regular, well-balanced diet. Choose foods such as meat, fish, milk and other dairy products, vegetables, fruits, whole-grain breads and cereals. Your caregiver will inform you of the ideal weight gain depending on your current height and weight. Drink lots of liquids. Try to drink 8 glasses of water a day.   Alcohol is associated with a number of birth defects including fetal alcohol syndrome. It is best to avoid alcohol completely. Smoking will cause low birth rate and prematurity. Use of alcohol and nicotine during your pregnancy also increases the chances that your child will be chemically dependent later in their life and may contribute to SIDS (Sudden Infant Death Syndrome).   Do not use illegal drugs.   Only take prescription or over-the-counter medications that are recommended by your caregiver. Other medications can cause genetic and physical problems in the baby.   Morning sickness can often be helped by keeping soda crackers at the bedside. Eat a few before getting up in the morning.   A sexual relationship may be continued until near the end of pregnancy if there are no other problems such as early (premature) leaking of amniotic fluid from the membranes, vaginal bleeding, painful intercourse or belly (abdominal) pain.   Exercise regularly. Check with your caregiver if you are unsure of the safety of some of your exercises.   Do not use hot tubs, steam rooms or saunas. These increase the risk of fainting and hurting  yourself and the baby. Swimming is OK for exercise. Get plenty of rest, including afternoon naps when possible, especially in the third trimester.   Avoid toxic odors and chemicals.   Do not wear high heels. They may cause you to lose your balance and fall.   Do not lift over 5 pounds. If you do lift anything, lift with your legs and thighs, not your back.   Avoid long trips, especially in the third trimester.   If you have to travel out of the city or state, take a copy of your medical records with you.  SEEK IMMEDIATE MEDICAL CARE IF:    You develop an unexplained oral temperature above 102 F (38.9 C), or as your caregiver suggests.   You have leaking of fluid from the vagina. If leaking membranes are suspected, take your temperature and inform your caregiver of this when you call.   There is vaginal spotting or bleeding. Notify your caregiver of the amount and how many pads are used.   You continue to feel sick to your stomach (nauseous) and have no relief from remedies suggested, or you throw up (vomit) blood or coffee ground like   materials.   You develop upper abdominal pain.   You have round ligament discomfort in the lower abdominal area. This still must be evaluated by your caregiver.   You feel contractions of the uterus.   You do not feel the baby move, or there is less movement than before.   You have painful urination.   You have abnormal vaginal discharge.   You have persistent diarrhea.   You get a severe headache.   You have problems with your vision.   You develop muscle weakness.   You feel dizzy and faint.   You develop shortness of breath.   You develop chest pain.   You have back pain that travels down to your leg and feet.   You feel irregular or a very fast heartbeat.   You develop excessive weight gain in a short period of time (5 pounds in 3 to 5 days).   You are involved in a domestic violence situation.  Document Released: 03/14/2005 Document Revised: 09/13/2011  Document Reviewed: 09/05/2008  [employer] Patient Information 2014 [employer].

## 2013-05-07 NOTE — ED Notes (Signed)
Unable to sign, no sign pad

## 2013-05-07 NOTE — ED Notes (Signed)
Patient waiting for her ultrasound.  No distress noted

## 2013-05-08 ENCOUNTER — Encounter (INDEPENDENT_AMBULATORY_CARE_PROVIDER_SITE_OTHER): Payer: Self-pay | Admitting: General Surgery

## 2013-05-10 ENCOUNTER — Ambulatory Visit (INDEPENDENT_AMBULATORY_CARE_PROVIDER_SITE_OTHER): Payer: 59 | Admitting: General Surgery

## 2013-06-12 ENCOUNTER — Encounter: Payer: Self-pay | Admitting: Certified Nurse Midwife

## 2013-06-25 ENCOUNTER — Ambulatory Visit: Payer: 59 | Admitting: Advanced Practice Midwife

## 2013-09-10 ENCOUNTER — Ambulatory Visit (INDEPENDENT_AMBULATORY_CARE_PROVIDER_SITE_OTHER): Payer: 59 | Admitting: Women's Health

## 2013-09-10 ENCOUNTER — Encounter: Payer: Self-pay | Admitting: Women's Health

## 2013-09-10 ENCOUNTER — Other Ambulatory Visit (HOSPITAL_COMMUNITY)
Admission: RE | Admit: 2013-09-10 | Discharge: 2013-09-10 | Disposition: A | Discharge status: Home / Self Care | Payer: 59 | Source: Ambulatory Visit | Attending: Gynecology | Admitting: Gynecology

## 2013-09-10 VITALS — BP 122/82 | Ht 63.0 in | Wt 213.8 lb

## 2013-09-10 DIAGNOSIS — Z01419 Encounter for gynecological examination (general) (routine) without abnormal findings: Secondary | ICD-10-CM

## 2013-09-10 DIAGNOSIS — B3731 Acute candidiasis of vulva and vagina: Secondary | ICD-10-CM

## 2013-09-10 DIAGNOSIS — B373 Candidiasis of vulva and vagina: Secondary | ICD-10-CM

## 2013-09-10 DIAGNOSIS — Z23 Encounter for immunization: Secondary | ICD-10-CM

## 2013-09-10 DIAGNOSIS — Z113 Encounter for screening for infections with a predominantly sexual mode of transmission: Secondary | ICD-10-CM

## 2013-09-10 LAB — CBC WITH DIFFERENTIAL/PLATELET
Basophils Absolute: 0 10*3/uL (ref 0.0–0.1)
Basophils Relative: 0 % (ref 0–1)
Eosinophils Absolute: 0.2 10*3/uL (ref 0.0–0.7)
Eosinophils Relative: 3 % (ref 0–5)
HCT: 31.7 % — ABNORMAL LOW (ref 36.0–46.0)
Hemoglobin: 10.2 g/dL — ABNORMAL LOW (ref 12.0–15.0)
Lymphocytes Relative: 23 % (ref 12–46)
Lymphs Abs: 1.5 10*3/uL (ref 0.7–4.0)
MCH: 26.3 pg (ref 26.0–34.0)
MCHC: 32.2 g/dL (ref 30.0–36.0)
MCV: 81.7 fL (ref 78.0–100.0)
Monocytes Absolute: 0.5 10*3/uL (ref 0.1–1.0)
Monocytes Relative: 7 % (ref 3–12)
Neutro Abs: 4.4 10*3/uL (ref 1.7–7.7)
Neutrophils Relative %: 67 % (ref 43–77)
Platelets: 306 10*3/uL (ref 150–400)
RBC: 3.88 MIL/uL (ref 3.87–5.11)
RDW: 16.3 % — ABNORMAL HIGH (ref 11.5–15.5)
WBC: 6.6 10*3/uL (ref 4.0–10.5)

## 2013-09-10 LAB — GLUCOSE, RANDOM: Glucose, Bld: 95 mg/dL (ref 70–99)

## 2013-09-10 LAB — WET PREP FOR TRICH, YEAST, CLUE
Clue Cells Wet Prep HPF POC: NONE SEEN
Trich, Wet Prep: NONE SEEN
Yeast Wet Prep HPF POC: NONE SEEN

## 2013-09-10 MED ORDER — FLUCONAZOLE 150 MG PO TABS: 150.0000 mg | ORAL_TABLET | Freq: Once | ORAL | Status: DC

## 2013-09-10 NOTE — Patient Instructions (Signed)
Intrauterine Device Information An intrauterine device (IUD) is inserted into your uterus to prevent pregnancy. There are two types of IUDs available:   Copper IUD This type of IUD is wrapped in copper wire and is placed inside the uterus. Copper makes the uterus and fallopian tubes produce a fluid that kills sperm. The copper IUD can stay in place for 10 years.  Hormone IUD This type of IUD contains the hormone progestin (synthetic progesterone). The hormone thickens the cervical mucus and prevents sperm from entering the uterus. It also thins the uterine lining to prevent implantation of a fertilized egg. The hormone can weaken or kill the sperm that get into the uterus. One type of hormone IUD can stay in place for 5 years, and another type can stay in place for 3 years. Your health care provider will make sure you are a good candidate for a contraceptive IUD. Discuss with your health care provider the possible side effects.  ADVANTAGES OF AN INTRAUTERINE DEVICE  IUDs are highly effective, reversible, long acting, and low maintenance.   There are no estrogen-related side effects.   An IUD can be used when breastfeeding.   IUDs are not associated with weight gain.   The copper IUD works immediately after insertion.   The hormone IUD works right away if inserted within 7 days of your period starting. You will need to use a backup method of birth control for 7 days if the hormone IUD is inserted at any other time in your cycle.  The copper IUD does not interfere with your female hormones.   The hormone IUD can make heavy menstrual periods lighter and decrease cramping.   The hormone IUD can be used for 3 or 5 years.   The copper IUD can be used for 10 years. DISADVANTAGES OF AN INTRAUTERINE DEVICE  The hormone IUD can be associated with irregular bleeding patterns.   The copper IUD can make your menstrual flow heavier and more painful.   You may experience cramping and  vaginal bleeding after insertion.  Document Released: 02/16/2004 Document Revised: 11/14/2012 Document Reviewed: 09/02/2012 [employer] Patient Information 2014 [employer].  

## 2013-09-10 NOTE — Progress Notes (Signed)
Amber Blanchard 07-15-1990 [id number]    History:    Presents for annual exam.  Regular monthly cycle/condoms. History of normal Paps, has not received gardasil. Complaint of recurrent yeast, recently used from over-the-counter Monistat. Reports not doing well on birth control pills, would like a hormone free contraception.  Past medical history, past surgical history, family history and social history were all reviewed and documented in the EPIC chart. [place]-3,. Parents Hypertension. Works at [employer].  ROS:  A  12 point ROS was performed and pertinent positives and negatives are included.  Exam:  Filed Vitals:   09/10/13 1023  BP: 122/82    General appearance:  Normal Thyroid:  Symmetrical, normal in size, without palpable masses or nodularity. Respiratory  Auscultation:  Clear without wheezing or rhonchi Cardiovascular  Auscultation:  Regular rate, without rubs, murmurs or gallops  Edema/varicosities:  Not grossly evident Abdominal  Soft,nontender, without masses, guarding or rebound.  Liver/spleen:  No organomegaly noted  Hernia:  None appreciated  Skin  Inspection:  Grossly normal/numerous tattoos   Breasts: Examined lying and sitting.     Right: Without masses, retractions, discharge or axillary adenopathy.     Left: Without masses, retractions, discharge or axillary adenopathy. Gentitourinary   Inguinal/mons:  Normal without inguinal adenopathy  External genitalia:  Normal  BUS/Urethra/Skene's glands:  Normal  Vagina:  Normal  Cervix:  Normal  Uterus:   normal in size, shape and contour.  Midline and mobile  Adnexa/parametria:     Rt: Without masses or tenderness.   Lt: Without masses or tenderness.  Anus and perineum: Normal    Assessment/Plan:  23 y.o. SBF G2P1 for annual exam with complaint of recurrent yeast.  Contraception management STD screen Obesity  Plan: Reviewed normality of wet prep and exam, Diflucan 150 times one dose if needed. SBE's,  exercise, calcium rich diet, MVI daily encouraged. Reviewed importance of increasing exercise and decreasing calories for weight loss. Contraception options reviewed, ParaGard IUD information given and reviewed will check coverage, Dr. Audie BoxFontaine to place with next cycle, abstain or condoms until placement. Gardasil information given and reviewed, first given return to office in 2 and 6 months to complete series. CBC, glucose, UA, Pap, GC/Chlamydia, HIV, hep B, C., RPR.  Note: This dictation was prepared with Dragon/digital dictation.  Any transcriptional errors that result are unintentional. Harrington ChallengerYOUNG,NANCY J A[facility] Hospital Of South DakotaWHNP, 6:12 PM 09/10/2013

## 2013-09-11 LAB — URINALYSIS W MICROSCOPIC + REFLEX CULTURE
Bilirubin Urine: NEGATIVE
Casts: NONE SEEN
Crystals: NONE SEEN
Glucose, UA: NEGATIVE mg/dL
Ketones, ur: NEGATIVE mg/dL
Nitrite: NEGATIVE
Protein, ur: NEGATIVE mg/dL
Specific Gravity, Urine: 1.024 (ref 1.005–1.030)
Urobilinogen, UA: 0.2 mg/dL (ref 0.0–1.0)
pH: 6.5 (ref 5.0–8.0)

## 2013-09-11 LAB — HIV ANTIBODY (ROUTINE TESTING W REFLEX): HIV 1&2 Ab, 4th Generation: NONREACTIVE

## 2013-09-11 LAB — GC/CHLAMYDIA PROBE AMP
CT Probe RNA: NEGATIVE
GC Probe RNA: NEGATIVE

## 2013-09-11 LAB — CYTOLOGY - PAP

## 2013-09-11 LAB — RPR

## 2013-09-11 LAB — HEPATITIS C ANTIBODY: HCV Ab: NEGATIVE

## 2013-09-11 LAB — HEPATITIS B SURFACE ANTIGEN: Hepatitis B Surface Ag: NEGATIVE

## 2013-09-12 ENCOUNTER — Telehealth: Payer: Self-pay | Admitting: Gynecology

## 2013-09-12 ENCOUNTER — Other Ambulatory Visit: Payer: Self-pay | Admitting: Gynecology

## 2013-09-12 DIAGNOSIS — Z3049 Encounter for surveillance of other contraceptives: Secondary | ICD-10-CM

## 2013-09-12 NOTE — Telephone Encounter (Signed)
09/12/13-PT WAS ADVISED THAT THE PARAGUARD WILL BE COVERED AT 100%. WILL CALL 1ST DAY NEXT CYCLE/[facility]

## 2013-09-13 ENCOUNTER — Other Ambulatory Visit: Payer: Self-pay | Admitting: Women's Health

## 2013-09-13 LAB — URINE CULTURE: Colony Count: >=100000

## 2013-09-13 MED ORDER — NITROFURANTOIN MONOHYD MACRO 100 MG PO CAPS: 100.0000 mg | ORAL_CAPSULE | Freq: Two times a day (BID) | ORAL | Status: DC

## 2013-10-21 ENCOUNTER — Ambulatory Visit: Payer: 59 | Admitting: Gynecology

## 2013-11-27 ENCOUNTER — Encounter: Payer: Self-pay | Admitting: Family Medicine

## 2013-11-27 ENCOUNTER — Ambulatory Visit (INDEPENDENT_AMBULATORY_CARE_PROVIDER_SITE_OTHER): Payer: 59 | Admitting: Family Medicine

## 2013-11-27 VITALS — BP 114/72 | HR 77 | Temp 99.3°F | Ht 63.5 in | Wt 196.9 lb

## 2013-11-27 DIAGNOSIS — K219 Gastro-esophageal reflux disease without esophagitis: Secondary | ICD-10-CM

## 2013-11-27 DIAGNOSIS — R109 Unspecified abdominal pain: Secondary | ICD-10-CM

## 2013-11-27 LAB — H. PYLORI ANTIBODY, IGG: H Pylori IgG: NEGATIVE

## 2013-11-27 LAB — CBC WITH DIFFERENTIAL/PLATELET
Basophils Absolute: 0 10*3/uL (ref 0.0–0.1)
Basophils Relative: 0.3 % (ref 0.0–3.0)
Eosinophils Absolute: 0.1 10*3/uL (ref 0.0–0.7)
Eosinophils Relative: 1.3 % (ref 0.0–5.0)
HCT: 32.7 % — ABNORMAL LOW (ref 36.0–46.0)
Hemoglobin: 10.6 g/dL — ABNORMAL LOW (ref 12.0–15.0)
Lymphocytes Relative: 21.4 % (ref 12.0–46.0)
Lymphs Abs: 1.4 10*3/uL (ref 0.7–4.0)
MCHC: 32.5 g/dL (ref 30.0–36.0)
MCV: 82.1 fl (ref 78.0–100.0)
Monocytes Absolute: 0.5 10*3/uL (ref 0.1–1.0)
Monocytes Relative: 7.8 % (ref 3.0–12.0)
Neutro Abs: 4.5 10*3/uL (ref 1.4–7.7)
Neutrophils Relative %: 69.2 % (ref 43.0–77.0)
Platelets: 293 10*3/uL (ref 150.0–400.0)
RBC: 3.98 Mil/uL (ref 3.87–5.11)
RDW: 16.1 % — ABNORMAL HIGH (ref 11.5–15.5)
WBC: 6.6 10*3/uL (ref 4.0–10.5)

## 2013-11-27 LAB — HEPATIC FUNCTION PANEL
ALT: 11 U/L (ref 0–35)
AST: 17 U/L (ref 0–37)
Albumin: 3.3 g/dL — ABNORMAL LOW (ref 3.5–5.2)
Alkaline Phosphatase: 68 U/L (ref 39–117)
Bilirubin, Direct: 0 mg/dL (ref 0.0–0.3)
Total Bilirubin: 0.9 mg/dL (ref 0.2–1.2)
Total Protein: 7.2 g/dL (ref 6.0–8.3)

## 2013-11-27 LAB — BASIC METABOLIC PANEL
BUN: 8 mg/dL (ref 6–23)
CO2: 24 mEq/L (ref 19–32)
Calcium: 8.4 mg/dL (ref 8.4–10.5)
Chloride: 106 mEq/L (ref 96–112)
Creatinine, Ser: 0.7 mg/dL (ref 0.4–1.2)
GFR: 131.65 mL/min (ref >60.00)
Glucose, Bld: 85 mg/dL (ref 70–99)
Potassium: 3.3 mEq/L — ABNORMAL LOW (ref 3.5–5.1)
Sodium: 136 mEq/L (ref 135–145)

## 2013-11-27 MED ORDER — GI COCKTAIL ~~LOC~~
30.0000 mL | Freq: Once | ORAL | Status: AC
  Administered 2013-11-27: 30 mL via ORAL

## 2013-11-27 MED ORDER — OMEPRAZOLE 40 MG PO CPDR: 40.0000 mg | DELAYED_RELEASE_CAPSULE | Freq: Every day | ORAL | Status: DC

## 2013-11-27 NOTE — Progress Notes (Signed)
Pre visit review using our clinic review tool, if applicable. No additional management support is needed unless otherwise documented below in the visit note. 

## 2013-11-27 NOTE — Progress Notes (Signed)
   Subjective:    Patient ID: Amber Blanchard, female    DOB: Feb 18, 1991, 23 y.o.   MRN: [id number]  HPI    Review of Systems     Objective:   Physical Exam        Assessment & Plan:   Subjective:     Amber Blanchard is a 23 y.o. female who presents for evaluation of abdominal pain. Onset was several months ago. Symptoms have been gradually worsening. The pain is described as aching, burning and cramping, and is 8/10 in intensity. Pain is located in the RUQ and epigastric region with radiation to left back and right back.  Aggravating factors: fatty foods.  Alleviating factors: none. Associated symptoms: nausea. The patient denies anorexia, arthralagias, belching, chills, constipation, diarrhea, dysuria, fever, flatus, frequency, headache, hematochezia, hematuria, melena, myalgias, sweats and vomiting.  Past Medical History  Diagnosis Date  . As child and both parents have hypertension but none now    There are no active problems to display for this patient.  Past Surgical History  Procedure Laterality Date  . Cesarean section  10/21/2010    Procedure: CESAREAN SECTION;  Surgeon: Catha Brow;  Location: [facility];  Service: Gynecology;  Laterality: N/A;   Family History  Problem Relation Age of Onset  . Hypertension Mother   . Cervical cancer Mother   . Hypertension Father    History   Social History  . Marital Status: Single    Spouse Name: N/A    Number of Children: N/A  . Years of Education: N/A   Social History Main Topics  . Smoking status: Never Smoker   . Smokeless tobacco: None  . Alcohol Use: Yes  . Drug Use: No  . Sexual Activity: Yes    Birth Control/ Protection: Condom   Other Topics Concern  . None   Social History Narrative  . None   Current Outpatient Prescriptions  Medication Sig Dispense Refill  . acetaminophen (TYLENOL) 500 MG tablet Take 1,000 mg by mouth every 6 (six) hours as needed for moderate pain.      Marland Kitchen ondansetron (ZOFRAN ODT) 4  MG disintegrating tablet Take 1 tablet (4 mg total) by mouth every 8 (eight) hours as needed for nausea or vomiting.  20 tablet  0   No current facility-administered medications for this visit.   Allergies: Review of patient's allergies indicates no known allergies.  Review of Systems Pertinent items are noted in HPI.     Objective:    BP 114/72  Pulse 77  Temp(Src) 99.3 F (37.4 C) (Oral)  Ht 5' 3.5" (1.613 m)  Wt 196 lb 13.9 oz (89.3 kg)  BMI 34.32 kg/m2  SpO2 99%  LMP 11/09/2013 General appearance: alert, cooperative, appears stated age and no distress Throat: lips, mucosa, and tongue normal; teeth and gums normal Neck: no adenopathy, supple, symmetrical, trachea midline and thyroid not enlarged, symmetric, no tenderness/mass/nodules Lungs: clear to auscultation bilaterally Heart: S1, S2 normal Abdomen: abnormal findings:  mild tenderness in the epigastrium -- no Ruq pain    Assessment:    Abdominal pain, likely secondary to GB vs gerd .    Plan:  Gi cocktail given with some relief Omeprazole qd   See orders for lab and imaging studies. Adhere to simple, bland diet. Follow up as needed. refer to GI --

## 2013-11-27 NOTE — Patient Instructions (Signed)

## 2013-11-29 ENCOUNTER — Encounter: Payer: Self-pay | Admitting: Gastroenterology

## 2013-12-19 ENCOUNTER — Ambulatory Visit: Payer: 59 | Admitting: Women's Health

## 2014-01-05 ENCOUNTER — Ambulatory Visit (INDEPENDENT_AMBULATORY_CARE_PROVIDER_SITE_OTHER): Payer: 59 | Admitting: Family Medicine

## 2014-01-05 VITALS — BP 137/87 | HR 69 | Temp 98.9°F | Resp 20 | Ht 62.75 in | Wt 184.2 lb

## 2014-01-05 DIAGNOSIS — L0390 Cellulitis, unspecified: Secondary | ICD-10-CM

## 2014-01-05 DIAGNOSIS — L0291 Cutaneous abscess, unspecified: Secondary | ICD-10-CM

## 2014-01-05 DIAGNOSIS — K644 Residual hemorrhoidal skin tags: Secondary | ICD-10-CM

## 2014-01-05 DIAGNOSIS — A630 Anogenital (venereal) warts: Secondary | ICD-10-CM

## 2014-01-05 DIAGNOSIS — L02215 Cutaneous abscess of perineum: Secondary | ICD-10-CM

## 2014-01-05 DIAGNOSIS — K648 Other hemorrhoids: Secondary | ICD-10-CM

## 2014-01-05 MED ORDER — DOXYCYCLINE HYCLATE 100 MG PO CAPS: 100.0000 mg | ORAL_CAPSULE | Freq: Two times a day (BID) | ORAL | Status: DC

## 2014-01-05 MED ORDER — HYDROCODONE-ACETAMINOPHEN 5-325 MG PO TABS: 1.0000 | ORAL_TABLET | ORAL | Status: DC | PRN

## 2014-01-05 NOTE — Patient Instructions (Addendum)
[facility] get scheduled with Harriett SineNancy to treat the genital warts around your rectum. Warm water epsom salt soaks for 20 minutes at least four times a day.  Recheck in clinic on Tuesday.  Genital Warts Genital warts are a sexually transmitted infection. They may appear as small bumps on the tissues of the genital area. CAUSES  Genital warts are caused by a virus called human papillomavirus (HPV). HPV is the most common sexually transmitted disease (STD) and infection of the sex organs. This infection is spread by having unprotected sex with an infected person. It can be spread by vaginal, anal, and oral sex. Many people do not know they are infected. They may be infected for years without problems. However, even if they do not have problems, they can unknowingly pass the infection to their sexual partners. SYMPTOMS   Itching and irritation in the genital area.  Warts that bleed.  Painful sexual intercourse. DIAGNOSIS  Warts are usually recognized with the naked eye on the vagina, vulva, perineum, anus, and rectum. Certain tests can also diagnose genital warts, such as:  A Pap test.  A tissue sample (biopsy) exam.  Colposcopy. A magnifying tool is used to examine the vagina and cervix. The HPV cells will change color when certain solutions are used. TREATMENT  Warts can be removed by:  Applying certain chemicals, such as cantharidin or podophyllin.  Liquid nitrogen freezing (cryotherapy).  Immunotherapy with Candida or Trichophyton injections.  Laser treatment.  Burning with an electrified probe (electrocautery).  Interferon injections.  Surgery. PREVENTION  HPV vaccination can help prevent HPV infections that cause genital warts and that cause cancer of the cervix. It is recommended that the vaccination be given to people between the ages 319 to 24 years old. The vaccine might not work as well or might not work at all if you already have HPV. It should not be  given to pregnant women. HOME CARE INSTRUCTIONS   It is important to follow your caregiver's instructions. The warts will not go away without treatment. Repeat treatments are often needed to get rid of warts. Even after it appears that the warts are gone, the normal tissue underneath often remains infected.  Do not try to treat genital warts with medicine used to treat hand warts. This type of medicine is strong and can burn the skin in the genital area, causing more damage.  Tell your past and current sexual partner(s) that you have genital warts. They may be infected also and need treatment.  Avoid sexual contact while being treated.  Do not touch or scratch the warts. The infection may spread to other parts of your body.  Women with genital warts should have a cervical cancer check (Pap test) at least once a year. This type of cancer is slow-growing and can be cured if found early. Chances of developing cervical cancer are increased with HPV.  Inform your obstetrician about your warts in the event of pregnancy. This virus can be passed to the baby's respiratory tract. Discuss this with your caregiver.  Use a condom during sexual intercourse. Following treatment, the use of condoms will help prevent reinfection.  Ask your caregiver about using over-the-counter anti-itch creams. SEEK MEDICAL CARE IF:   Your treated skin becomes red, swollen, or painful.  You have a fever.  You feel generally ill.  You feel little lumps in and around your genital area.  You are bleeding or have painful sexual intercourse. MAKE SURE YOU:   Understand these  instructions.  Will watch your condition.  Will get help right away if you are not doing well or get worse. Document Released: 03/11/2000 Document Revised: 07/29/2013 Document Reviewed: 09/20/2010 [facility] Patient Information 2015 [place], [place]. This information is not intended to replace advice given to you by your health care provider.  Make sure you discuss any questions you have with your health care provider.  Hemorrhoids Hemorrhoids are swollen veins around the rectum or anus. There are two types of hemorrhoids:   Internal hemorrhoids. These occur in the veins just inside the rectum. They may poke through to the outside and become irritated and painful.  External hemorrhoids. These occur in the veins outside the anus and can be felt as a painful swelling or hard lump near the anus. CAUSES  Pregnancy.   Obesity.   Constipation or diarrhea.   Straining to have a bowel movement.   Sitting for long periods on the toilet.  Heavy lifting or other activity that caused you to strain.  Anal intercourse. SYMPTOMS   Pain.   Anal itching or irritation.   Rectal bleeding.   Fecal leakage.   Anal swelling.   One or more lumps around the anus.  DIAGNOSIS  Your caregiver may be able to diagnose hemorrhoids by visual examination. Other examinations or tests that may be performed include:   Examination of the rectal area with a gloved hand (digital rectal exam).   Examination of anal canal using a small tube (scope).   A blood test if you have lost a significant amount of blood.  A test to look inside the colon (sigmoidoscopy or colonoscopy). TREATMENT Most hemorrhoids can be treated at home. However, if symptoms do not seem to be getting better or if you have a lot of rectal bleeding, your caregiver may perform a procedure to help make the hemorrhoids get smaller or remove them completely. Possible treatments include:   Placing a rubber band at the base of the hemorrhoid to cut off the circulation (rubber band ligation).   Injecting a chemical to shrink the hemorrhoid (sclerotherapy).   Using a tool to burn the hemorrhoid (infrared light therapy).   Surgically removing the hemorrhoid (hemorrhoidectomy).   Stapling the hemorrhoid to block blood flow to the tissue (hemorrhoid stapling).   HOME CARE INSTRUCTIONS   Eat foods with fiber, such as whole grains, beans, nuts, fruits, and vegetables. Ask your doctor about taking products with added fiber in them (fibersupplements).  Increase fluid intake. Drink enough water and fluids to keep your urine clear or pale yellow.   Exercise regularly.   Go to the bathroom when you have the urge to have a bowel movement. Do not wait.   Avoid straining to have bowel movements.   Keep the anal area dry and clean. Use wet toilet paper or moist towelettes after a bowel movement.   Medicated creams and suppositories may be used or applied as directed.   Only take over-the-counter or prescription medicines as directed by your caregiver.   Take warm sitz baths for 15-20 minutes, 3-4 times a day to ease pain and discomfort.   Place ice packs on the hemorrhoids if they are tender and swollen. Using ice packs between sitz baths may be helpful.   Put ice in a plastic bag.   Place a towel between your skin and the bag.   Leave the ice on for 15-20 minutes, 3-4 times a day.   Do not use a donut-shaped pillow or sit on the  toilet for long periods. This increases blood pooling and pain.  SEEK MEDICAL CARE IF:  You have increasing pain and swelling that is not controlled by treatment or medicine.  You have uncontrolled bleeding.  You have difficulty or you are unable to have a bowel movement.  You have pain or inflammation outside the area of the hemorrhoids. MAKE SURE YOU:  Understand these instructions.  Will watch your condition.  Will get help right away if you are not doing well or get worse. Document Released: 03/11/2000 Document Revised: 02/29/2012 Document Reviewed: 01/17/2012 [facility] Patient Information 2015 [place]. This information is not intended to replace advice given to you by your health care provider. Make sure you discuss any questions you have with your health care provider.

## 2014-01-05 NOTE — Progress Notes (Signed)
Subjective:    Patient ID: Amber Blanchard, female    DOB: [birth year], 23 y.o.   MRN: [id number]  Abscess Pertinent negatives include no chills, fever, nausea or vomiting.   Chief Complaint  Patient presents with  . Abscess    abscess between her vagania and her buttocks.  x 1 week   This chart was scribed for Delman Cheadle, MD by Thea Alken, ED Scribe. This patient was seen in room 9 and the patient's care was started at 4:17 PM.  HPI Comments: Amber Blanchard is a 23 y.o. female who presents to the [facility] complaining of bartholin's abscess with associated drainage onset 1 week. Pt reports hx bartholin abscess in 2012. Pt reports abscess has been self draining consisting of blood. Pt has applied warm compressed without relief. Pt states she tried to squeeze abscess this morning. She denies vaginal pain or bleeding.  Pt denies fever and chills. She denies nausea, emesis, constipation and diarrhea.  Past Medical History  Diagnosis Date  . As child and both parents have hypertension but none now    Past Surgical History  Procedure Laterality Date  . Cesarean section  10/21/2010    Procedure: CESAREAN SECTION;  Surgeon: Catha Brow;  Location: [facility];  Service: Gynecology;  Laterality: N/A;   Prior to Admission medications   Medication Sig Start Date End Date Taking? Authorizing Provider  acetaminophen (TYLENOL) 500 MG tablet Take 1,000 mg by mouth every 6 (six) hours as needed for moderate pain.   Yes Historical Provider, MD   Review of Systems  Constitutional: Negative for fever and chills.  Gastrointestinal: Negative for nausea, vomiting, diarrhea and constipation.  Genitourinary: Negative for dysuria, urgency, frequency, vaginal bleeding, difficulty urinating and vaginal pain.    Objective:   Physical Exam  Nursing note and vitals reviewed. Constitutional: She is oriented to person, place, and time. She appears well-developed and well-nourished. No  distress.  HENT:  Head: Normocephalic and atraumatic.  Eyes: Conjunctivae and EOM are normal.  Neck: Neck supple.  Cardiovascular: Normal rate.   Pulmonary/Chest: Effort normal.  Genitourinary:  1in purulent erythematous bulging lesion with tenderness and fluctuance on left inner upper paranea radiating to corneal body not on labia.   2 large external hemorrhoids and multi small warts surrounding anus.   Musculoskeletal: Normal range of motion.  Neurological: She is alert and oriented to person, place, and time.  Skin: Skin is warm and dry.  Psychiatric: She has a normal mood and affect. Her behavior is normal.    Assessment & Plan:   Cellulitis and abscess - Plan: CANCELED: Wound culture  Abscess of perineum - Plan: Wound culture - pt could not tolerate procedure to no purulence expressed - start doxy and recheck in 2d  External hemorrhoid - not currently inflammed - has appt w/ GI sched  Genital warts due to HPV (human papillomavirus) - numerous amount - >20 small around anus so pt will be unable to self-treat with aldara - refer to gynecology for treatment  Meds ordered this encounter  Medications  . doxycycline (VIBRAMYCIN) 100 MG capsule    Sig: Take 1 capsule (100 mg total) by mouth 2 (two) times daily.    Dispense:  20 capsule    Refill:  0  . HYDROcodone-acetaminophen (NORCO/VICODIN) 5-325 MG per tablet    Sig: Take 1-2 tablets by mouth every 4 (four) hours as needed for moderate pain.    Dispense:  30 tablet  Refill:  0    I personally performed the services described in this documentation, which was scribed in my presence. The recorded information has been reviewed and considered, and addended by me as needed.  Delman Cheadle, MD MPH

## 2014-01-07 ENCOUNTER — Ambulatory Visit (INDEPENDENT_AMBULATORY_CARE_PROVIDER_SITE_OTHER): Payer: 59 | Admitting: Women's Health

## 2014-01-07 ENCOUNTER — Encounter: Payer: Self-pay | Admitting: Women's Health

## 2014-01-07 DIAGNOSIS — A630 Anogenital (venereal) warts: Secondary | ICD-10-CM

## 2014-01-07 DIAGNOSIS — B977 Papillomavirus as the cause of diseases classified elsewhere: Secondary | ICD-10-CM

## 2014-01-07 DIAGNOSIS — Z23 Encounter for immunization: Secondary | ICD-10-CM

## 2014-01-07 MED ORDER — IMIQUIMOD 5 % EX CREA: TOPICAL_CREAM | CUTANEOUS | Status: DC

## 2014-01-07 NOTE — Progress Notes (Signed)
Patient ID: Amber Blanchard, female   DOB: 1990-12-17, 23 y.o.   MRN: [id number] Presents with complaint of genital warts,  Was seen at urgent care 01/03/2014  for a perineal abscess treated with doxycycline and was told to followup for genital warts. Monthly cycle/condoms last active greater than 3 months ago. Negative STD screen 08/2013/same partner.  Does not recall when warts started. Denies vaginal discharge, urinary symptoms, abdominal pain or fever..  Exam: External genitalia 3 cm left perineal abscess draining clear drainage. Left of rectum multiple condyloma noted, several small nonthrombosed hemorrhoids noted.  Rectal condyloma  Resolving perineal abscess  Plan: Second gardasil given, instructed to return in 4 months to complete series. Rectal area  prepped with Xylocaine jelly, TCA 80% applied, tolerated well. Aldara 5% 3 times weekly at bedtime wash off in a.m., prescription, proper use given and reviewed. Instruct her return to office for repeat treatment if symptoms do not resolve.

## 2014-01-07 NOTE — Patient Instructions (Signed)
Genital Warts Genital warts are caused by a germ (human papillomavirus, HPV). This germ is spread by having unprotected sex (intercourse) with an infected person. It can be spread by vaginal, anal, and oral sex. A person who is infected might not show any signs or problems. HOME CARE  Follow your doctor's treatment instructions.  Do not use medicine that is meant for hand warts. Only take medicine as told by your doctor.  Tell your past and current sex partner(s) that you have genital warts. They may need treatment.  Avoid sexual contact while you are being treated.  Do not touch or scratch the warts. You could spread it to other parts of your body.  Women with genital warts should have a cervical cancer check (Pap test) at least once a year.  Tell your doctor if you become pregnant. The germ can be passed to the baby.  After treatment, use a condom during sex.  Ask your doctor before using anti-itch creams. GET HELP RIGHT AWAY IF:   Your treated skin becomes red, puffy (swollen), or painful.  You have a fever.  You feel generally sick.  You feel little lumps in and around your genital area.  You are bleeding or have pain during sex. MAKE SURE YOU:   Understand these instructions.  Will watch your condition.  Will get help right away if you are not doing well or get worse. Document Released: 06/08/2009 Document Revised: 06/06/2011 Document Reviewed: 09/20/2010 [facility] Patient Information 2015 [place], [place]. This information is not intended to replace advice given to you by your health care provider. Make sure you discuss any questions you have with your health care provider.

## 2014-01-08 ENCOUNTER — Telehealth: Payer: Self-pay | Admitting: *Deleted

## 2014-01-08 DIAGNOSIS — A630 Anogenital (venereal) warts: Secondary | ICD-10-CM

## 2014-01-08 LAB — WOUND CULTURE
Gram Stain: NONE SEEN
Gram Stain: NONE SEEN

## 2014-01-08 MED ORDER — IMIQUIMOD 5 % EX CREA
TOPICAL_CREAM | CUTANEOUS | Status: DC
Start: 2014-01-08 — End: 2014-10-12

## 2014-01-08 NOTE — Telephone Encounter (Signed)
Pt called requesting Rx from [facility] Aldara 5% 3 times weekly sent to another pharmacy, pt asked for it to be sent to [facility].

## 2014-01-15 ENCOUNTER — Telehealth: Payer: Self-pay

## 2014-01-15 NOTE — Telephone Encounter (Signed)
Pt was on the strongest dose of doxy. I recommended that she have an I&D of the area but she couldn't tolerate it due to pain with just the lidocaine so she was put just on antibiotics and recommended to RTC for recheck - agree that needs to be seen to see if med needs to be changed

## 2014-01-15 NOTE — Telephone Encounter (Signed)
Spoke to pt she is going to rtc tomorrow for a recheck. She is improving but the area is not healing fast. Still looks infected.

## 2014-01-15 NOTE — Telephone Encounter (Signed)
PT STATES TREATED BY DR Brigitte Pulse, FOR WHAT TURNED OUT TO BE MERSA. PT WAS PRESCRIBED 100MG OF DOXYCYLINE PT STATES HAS 2 PILLS LEFT, SHE IS IMPROVING, BUT IT HAS NOT GONE AWAY. PT IS REQUESTING A REFILL AND WANTS TO VERIFY THE DOSAGE IS STRONG ENOUGH PT CAN BE REACHED AT [phone] AND HER PHARMACY IS [facility] [address]

## 2014-01-18 ENCOUNTER — Ambulatory Visit (INDEPENDENT_AMBULATORY_CARE_PROVIDER_SITE_OTHER): Payer: 59 | Admitting: Emergency Medicine

## 2014-01-18 VITALS — BP 142/80 | HR 70 | Temp 98.4°F | Resp 16 | Ht 62.25 in | Wt 183.4 lb

## 2014-01-18 DIAGNOSIS — L0390 Cellulitis, unspecified: Secondary | ICD-10-CM

## 2014-01-18 DIAGNOSIS — N898 Other specified noninflammatory disorders of vagina: Secondary | ICD-10-CM

## 2014-01-18 DIAGNOSIS — L0291 Cutaneous abscess, unspecified: Secondary | ICD-10-CM

## 2014-01-18 LAB — POCT WET PREP WITH KOH
Clue Cells Wet Prep HPF POC: NEGATIVE
KOH Prep POC: POSITIVE
RBC Wet Prep HPF POC: NEGATIVE
Trichomonas, UA: NEGATIVE
Yeast Wet Prep HPF POC: POSITIVE

## 2014-01-18 MED ORDER — CEPHALEXIN 500 MG PO CAPS: 500.0000 mg | ORAL_CAPSULE | Freq: Two times a day (BID) | ORAL | Status: DC

## 2014-01-18 MED ORDER — FLUCONAZOLE 150 MG PO TABS: 150.0000 mg | ORAL_TABLET | Freq: Once | ORAL | Status: DC

## 2014-01-18 NOTE — Progress Notes (Addendum)
Subjective:  This chart was scribed for Lesle ChrisSteven Amber Harwick, MD by Elveria Risingimelie Horne, Medial Scribe. This patient was seen in room 13 and the patient's care was started at 3:59 PM.    Patient ID: Amber Blanchard, female    DOB: [birth year], 23 y.o.   MRN: [id number]  Chief Complaint  Patient presents with  . Vaginal Pain    yeast infection from abx.  she stated that the cellulitis is not better    HPI HPI Comments: Amber Blanchard is a 23 y.o. female who presents to the [facility] complaining of suspected yeast infection due to recent antibiotics for abscess of the perineum. Patient evaluated for her her abscess 10/11 and started on Doxycycline. Patient finished the course two days ago. Patient states that her surrounding cellulitis has improved, but has not completely resolved with treatment. Patient shares history of HPV with genital warts.   June 2015 patient had GC chlamydia probe, RPR, Hep B and C testing that all resulted negatively.   Patient Active Problem List   Diagnosis Date Noted  . Condyloma of female genitalia 01/07/2014   Past Medical History  Diagnosis Date  . As child and both parents have hypertension but none now    Past Surgical History  Procedure Laterality Date  . Cesarean section  10/21/2010    Procedure: CESAREAN SECTION;  Surgeon: Jessee Aversara J. Cole;  Location: [facility];  Service: Gynecology;  Laterality: N/A;   No Known Allergies Prior to Admission medications   Medication Sig Start Date End Date Taking? Authorizing Provider  acetaminophen (TYLENOL) 500 MG tablet Take 1,000 mg by mouth every 6 (six) hours as needed for moderate pain.   Yes Historical Provider, MD  HYDROcodone-acetaminophen (NORCO/VICODIN) 5-325 MG per tablet Take 1-2 tablets by mouth every 4 (four) hours as needed for moderate pain. 01/05/14  Yes Sherren MochaEva N Shaw, MD  imiquimod Mathis Dad(ALDARA) 5 % cream Apply topically 3 (three) times a week. 01/08/14  Yes Harrington ChallengerNancy J Young, NP   History   Social  History  . Marital Status: Single    Spouse Name: N/A    Number of Children: N/A  . Years of Education: N/A   Occupational History  . Not on file.   Social History Main Topics  . Smoking status: Never Smoker   . Smokeless tobacco: Not on file  . Alcohol Use: Yes  . Drug Use: No  . Sexual Activity: Yes    Birth Control/ Protection: Condom   Other Topics Concern  . Not on file   Social History Narrative  . No narrative on file    Review of Systems  Constitutional: Negative for fatigue.  Genitourinary: Positive for vaginal pain.  Skin: Positive for rash.       Objective:   Physical Exam  Nursing note and vitals reviewed.   CONSTITUTIONAL: Well developed/well nourished HEAD: Normocephalic/atraumatic EYES: EOMI/PERRL ENMT: Mucous membranes moist NECK: supple no meningeal signs SPINE:entire spine nontender CV: S1/S2 noted, no murmurs/rubs/gallops noted LUNGS: Lungs are clear to auscultation bilaterally, no apparent distress ABDOMEN: soft, nontender, no rebound or guarding GU:no cva tenderness; significant redness at introitus; multiple condylomata around perineum and anus; 1x1 cm open ulcer on left side of perineum  NEURO: Pt is awake/alert, moves all extremitiesx4 EXTREMITIES: pulses normal, full ROM SKIN: warm, color normal PSYCH: no abnormalities of mood noted  Filed Vitals:   01/18/14 1553  BP: 142/80  Pulse: 70  Temp: 98.4 F (36.9 C)  TempSrc: Oral  Resp: 16  Height: 5' 2.25" (1.581 m)  Weight: 183 lb 6.4 oz (83.19 kg)  SpO2: 98%   Results for orders placed in visit on 01/18/14  POCT WET PREP WITH KOH      Result Value Ref Range   Trichomonas, UA Negative     Clue Cells Wet Prep HPF POC neg     Epithelial Wet Prep HPF POC 1-2     Yeast Wet Prep HPF POC pos     Bacteria Wet Prep HPF POC 3+     RBC Wet Prep HPF POC neg     WBC Wet Prep HPF POC 1-3     KOH Prep POC Positive           I did not repeat her STD testing. I told her to be sure she  followed up regarding her condylomata  Diflucan today repeat in 1 week. She was given another week of cephalexin to cover her for MSSA  .  I personally performed the services described in this documentation, which was scribed in my presence. The recorded information has been reviewed and is accurate.

## 2014-01-18 NOTE — Patient Instructions (Signed)

## 2014-01-21 LAB — WOUND CULTURE
Gram Stain: NONE SEEN
Gram Stain: NONE SEEN
Gram Stain: NONE SEEN

## 2014-01-24 ENCOUNTER — Encounter: Payer: Self-pay | Admitting: *Deleted

## 2014-01-27 ENCOUNTER — Encounter: Payer: Self-pay | Admitting: *Deleted

## 2014-01-30 ENCOUNTER — Ambulatory Visit: Payer: 59 | Admitting: Gastroenterology

## 2014-02-07 ENCOUNTER — Ambulatory Visit: Payer: 59 | Admitting: Family Medicine

## 2014-02-07 ENCOUNTER — Telehealth: Payer: Self-pay | Admitting: *Deleted

## 2014-02-07 NOTE — Telephone Encounter (Signed)
Pt did not show for appointment 02/07/2014 at 1:30pm to establish care

## 2014-02-07 NOTE — Telephone Encounter (Signed)
Pt needs no-show fee 

## 2014-02-10 NOTE — Telephone Encounter (Signed)
See note below

## 2014-04-16 ENCOUNTER — Ambulatory Visit: Payer: 59 | Admitting: Women's Health

## 2014-04-16 ENCOUNTER — Ambulatory Visit: Payer: 59 | Admitting: Nurse Practitioner

## 2014-04-24 ENCOUNTER — Emergency Department (HOSPITAL_COMMUNITY): Admission: EM | Admit: 2014-04-24 | Payer: 59 | Source: Home / Self Care

## 2014-04-30 ENCOUNTER — Ambulatory Visit (INDEPENDENT_AMBULATORY_CARE_PROVIDER_SITE_OTHER): Payer: 59 | Admitting: Women's Health

## 2014-04-30 ENCOUNTER — Encounter: Payer: Self-pay | Admitting: Women's Health

## 2014-04-30 DIAGNOSIS — L02215 Cutaneous abscess of perineum: Secondary | ICD-10-CM

## 2014-04-30 DIAGNOSIS — Z1151 Encounter for screening for human papillomavirus (HPV): Secondary | ICD-10-CM

## 2014-04-30 DIAGNOSIS — Z23 Encounter for immunization: Secondary | ICD-10-CM

## 2014-04-30 MED ORDER — SULFAMETHOXAZOLE-TRIMETHOPRIM 800-160 MG PO TABS: 1.0000 | ORAL_TABLET | Freq: Two times a day (BID) | ORAL | Status: DC

## 2014-04-30 MED ORDER — FLUCONAZOLE 150 MG PO TABS: 150.0000 mg | ORAL_TABLET | Freq: Once | ORAL | Status: DC

## 2014-04-30 NOTE — Patient Instructions (Signed)
Genital Warts Genital warts are caused by a germ (human papillomavirus, HPV). This germ is spread by having unprotected sex (intercourse) with an infected person. It can be spread by vaginal, anal, and oral sex. A person who is infected might not show any signs or problems. HOME CARE  Follow your doctor's treatment instructions.  Do not use medicine that is meant for hand warts. Only take medicine as told by your doctor.  Tell your past and current sex partner(s) that you have genital warts. They may need treatment.  Avoid sexual contact while you are being treated.  Do not touch or scratch the warts. You could spread it to other parts of your body.  Women with genital warts should have a cervical cancer check (Pap test) at least once a year.  Tell your doctor if you become pregnant. The germ can be passed to the baby.  After treatment, use a condom during sex.  Ask your doctor before using anti-itch creams. GET HELP RIGHT AWAY IF:   Your treated skin becomes red, puffy (swollen), or painful.  You have a fever.  You feel generally sick.  You feel little lumps in and around your genital area.  You are bleeding or have pain during sex. MAKE SURE YOU:   Understand these instructions.  Will watch your condition.  Will get help right away if you are not doing well or get worse. Document Released: 06/08/2009 Document Revised: 06/06/2011 Document Reviewed: 09/20/2010 [employer] Patient Information 2015 [employer]. This information is not intended to replace advice given to you by your health care provider. Make sure you discuss any questions you have with your health care provider.  

## 2014-04-30 NOTE — Addendum Note (Signed)
Addended by: Aura CampsWEBB, Brianni Manthe L on: 04/30/2014 12:11 PM   Modules accepted: Orders

## 2014-04-30 NOTE — Progress Notes (Signed)
Patient ID: Romilda JoyJasmine N Blanchard, female   DOB: 1990-06-23, 24 y.o.   MRN: [id number] Presents with complaint of persistent genital warts and would like removed. Not sure of date when started, first treated 12/2013 with TCA 80% and then Aldara with minimal change. Treated for a left perineal abscess 12/2013 with doxycycline moderate relief but still has a tender area. Denies fever, abdominal pain, vaginal discharge or urinary symptoms. Has not been sexually active for several months, negative STD screen with past partner. Has received 2 gardasils. 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  Exam: Appears well. External genitalia multiple condyloma right perineal area toward rectum, 2 nonthrombosed hemorrhoids, no visible discharge. Left of rectum slightly tender firm nonindurated area.   Condyloma Partially resolved perineal abscess  Plan: Schedule consult with Dr. Audie BoxFontaine to discuss possible laser treatment of condyloma, Septra DS twice daily for 7 days #14, warm soaks, loose clothing. Condoms encouraged if sexually active. Diflucan 150 by mouth 1 dose if yeast symptoms after antibiotic. Third gardasil given today.

## 2014-05-09 ENCOUNTER — Institutional Professional Consult (permissible substitution): Payer: 59 | Admitting: Gynecology

## 2014-08-13 ENCOUNTER — Telehealth: Payer: Self-pay

## 2014-08-14 ENCOUNTER — Telehealth: Payer: Self-pay | Admitting: *Deleted

## 2014-08-14 NOTE — Telephone Encounter (Signed)
Patient called to see if Dr Clelia CroftShaw could recommend anything for her stomach pain and constipation.  She has tried OTC medications.  Recommended patient come in today to be seen for this.  We must investigate the source of her stomach pain and constipation before we can prescribe anything.  Patient will try to come in today.

## 2014-08-14 NOTE — Telephone Encounter (Signed)
Pt called and left message in voicemail with questions. I called pt back and told her to call.

## 2014-08-15 ENCOUNTER — Ambulatory Visit: Payer: 59 | Admitting: Family Medicine

## 2014-09-01 ENCOUNTER — Ambulatory Visit: Payer: 59 | Admitting: Medical

## 2014-09-01 DIAGNOSIS — Z0289 Encounter for other administrative examinations: Secondary | ICD-10-CM

## 2014-09-03 ENCOUNTER — Telehealth: Payer: Self-pay | Admitting: Medical

## 2014-09-03 ENCOUNTER — Encounter: Payer: Self-pay | Admitting: Medical

## 2014-09-03 NOTE — Telephone Encounter (Signed)
charge 

## 2014-09-03 NOTE — Telephone Encounter (Signed)
Pt was no show for acute visit on 09/01/14. Letter sent. Charge?

## 2014-09-09 ENCOUNTER — Institutional Professional Consult (permissible substitution): Payer: 59 | Admitting: Gynecology

## 2014-09-20 ENCOUNTER — Ambulatory Visit (INDEPENDENT_AMBULATORY_CARE_PROVIDER_SITE_OTHER): Payer: 59 | Admitting: Family Medicine

## 2014-09-20 VITALS — BP 129/86 | HR 91 | Temp 98.5°F | Resp 18 | Wt 171.0 lb

## 2014-09-20 DIAGNOSIS — A630 Anogenital (venereal) warts: Secondary | ICD-10-CM

## 2014-09-20 DIAGNOSIS — K610 Anal abscess: Secondary | ICD-10-CM | POA: Diagnosis not present

## 2014-09-20 DIAGNOSIS — L02215 Cutaneous abscess of perineum: Secondary | ICD-10-CM

## 2014-09-20 DIAGNOSIS — N898 Other specified noninflammatory disorders of vagina: Secondary | ICD-10-CM | POA: Diagnosis not present

## 2014-09-20 LAB — POCT WET PREP WITH KOH
Clue Cells Wet Prep HPF POC: NEGATIVE
KOH Prep POC: NEGATIVE
RBC Wet Prep HPF POC: NEGATIVE
Trichomonas, UA: NEGATIVE
Yeast Wet Prep HPF POC: NEGATIVE

## 2014-09-20 MED ORDER — SULFAMETHOXAZOLE-TRIMETHOPRIM 800-160 MG PO TABS: 1.0000 | ORAL_TABLET | Freq: Two times a day (BID) | ORAL | Status: DC

## 2014-09-20 MED ORDER — FLUCONAZOLE 150 MG PO TABS: 150.0000 mg | ORAL_TABLET | Freq: Once | ORAL | Status: DC

## 2014-09-20 NOTE — Patient Instructions (Signed)
We are going to treat you for recurrence of your abscess with septra antibiotic- take it twice a day for one week Also sit in warm bath water 1-2x a day for the next several days.  If not improving let me know! I will be in touch with the rest of your labs asap As far as your warts, go ahead and follow-up with your OBG as planned next month. If they do not want to proceed with any further treatment you may want to get an opinion from general surgery. I am glad to set this up for you if need be

## 2014-09-20 NOTE — Progress Notes (Addendum)
[facility] [address] [place] [id number] [phone]  Date:  09/20/2014   Name:  Amber Blanchard   DOB:  09-22-1990   MRN:  [id number]  PCP:  Garnet Koyanagi, DO    Chief Complaint: Abscess and Genital Warts   History of Present Illness:  Amber Blanchard is a 24 y.o. very pleasant female patient who presents with the following:  Here today with a couple of concerns.  She is on Aldara for genital warts- she feels like the warts are a bit better but not yet resolved. She is not sure what to do next.  Her OBG is treating her for this currently and she will see then next month.  She had a perrieal abscess in the fall- she notes that the area seems to be still hard and tender.  There is no drainage.  It did seem to go away, but now hurts again- it has been tender again for about one week.  She also has noted some vaginal discharge for about one week and would like to have this examined as well  She OW feels well and is generally healthy Next MP is expected in about 2 days- she has not recently been SA so there is no pregnancy risk   Patient Active Problem List   Diagnosis Date Noted  . Condyloma of female genitalia 01/07/2014    Past Medical History  Diagnosis Date  . As child and both parents have hypertension but none now   . HPV in female   . Perineal abscess   . External hemorrhoids   . Anemia   . Cholelithiasis     Past Surgical History  Procedure Laterality Date  . Cesarean section  10/21/2010    Procedure: CESAREAN SECTION;  Surgeon: Catha Brow;  Location: [facility];  Service: Gynecology;  Laterality: N/A;    History  Substance Use Topics  . Smoking status: Never Smoker   . Smokeless tobacco: Not on file  . Alcohol Use: Yes    Family History  Problem Relation Age of Onset  . Hypertension Mother   . Cervical cancer Mother   . Hypertension Father     No Known Allergies  Medication list has been reviewed and updated.  Current  Outpatient Prescriptions on File Prior to Visit  Medication Sig Dispense Refill  . imiquimod (ALDARA) 5 % cream Apply topically 3 (three) times a week. 12 each 0  . HYDROcodone-acetaminophen (NORCO/VICODIN) 5-325 MG per tablet Take 1-2 tablets by mouth every 4 (four) hours as needed for moderate pain. (Patient not taking: Reported on 04/30/2014) 30 tablet 0   No current facility-administered medications on file prior to visit.    Review of Systems:  As per HPI- otherwise negative.   Physical Examination: Filed Vitals:   09/20/14 1532  BP: 129/86  Pulse: 91  Temp: 98.5 F (36.9 C)  Resp: 18   Filed Vitals:   09/20/14 1532  Weight: 171 lb (77.565 kg)   Body mass index is 31.03 kg/(m^2). Ideal Body Weight:    GEN: WDWN, NAD, Non-toxic, A & O x 3, overweight, looks well HEENT: Atraumatic, Normocephalic. Neck supple. No masses, No LAD. Ears and Nose: No external deformity. CV: RRR, No M/G/R. No JVD. No thrill. No extra heart sounds. PULM: CTA B, no wheezes, crackles, rhonchi. No retractions. No resp. distress. No accessory muscle use. ABD: S, NT, ND. No rebound. No HSM. EXTR: No c/c/e NEURO Normal gait.  PSYCH: Normally interactive. Conversant. Not depressed or anxious appearing.  Calm demeanor.  GU: normal labia majora.  Just inferior to the right labia is a tender area consistent with small abscess (appears to be at scarred area- consistent with site of prior abscess) which is spontaneously draining thin purulent fluid. Vaginal exam normal, no unusual discharge Rectal exam reveals multiple small external anal warts, and a 1cmX2cm likely external hemorrhoid at 3:00   Assessment and Plan: Perianal abscess - Plan: fluconazole (DIFLUCAN) 150 MG tablet  Vaginal discharge - Plan: POCT Wet Prep with KOH, GC/Chlamydia Probe Amp  Anal warts  Abscess, perineum - Plan: sulfamethoxazole-trimethoprim (BACTRIM DS) 800-160 MG per tablet  Treated for her abscess with septra, sitz baths,  diflucan if needed for any yeast vaginitis See patient instructions for more details.     Signed Lamar Blinks, MD  Called her on 6/28 to follow-up- LMOM that her GC and chlamydia were negative   Results for orders placed or performed in visit on 09/20/14  GC/Chlamydia Probe Amp  Result Value Ref Range   CT Probe RNA NEGATIVE    GC Probe RNA NEGATIVE   POCT Wet Prep with KOH  Result Value Ref Range   Trichomonas, UA Negative    Clue Cells Wet Prep HPF POC negative    Epithelial Wet Prep HPF POC Few Few, Moderate, Many   Yeast Wet Prep HPF POC negative    Bacteria Wet Prep HPF POC Few Few   RBC Wet Prep HPF POC negative    WBC Wet Prep HPF POC 0-2    KOH Prep POC Negative

## 2014-09-22 ENCOUNTER — Telehealth: Payer: Self-pay

## 2014-09-22 NOTE — Telephone Encounter (Signed)
Pt calling about labs. Let her know they were still pending and that we would call her when they were back.

## 2014-09-23 ENCOUNTER — Telehealth: Payer: Self-pay | Admitting: Radiology

## 2014-09-23 LAB — GC/CHLAMYDIA PROBE AMP
CT Probe RNA: NEGATIVE
GC Probe RNA: NEGATIVE

## 2014-09-23 NOTE — Telephone Encounter (Signed)
Pt is calling about lab results  

## 2014-09-24 NOTE — Telephone Encounter (Signed)
Let pt know that her GC/Chlamydia were negative.

## 2014-10-03 ENCOUNTER — Institutional Professional Consult (permissible substitution): Payer: 59 | Admitting: Gynecology

## 2014-10-03 ENCOUNTER — Telehealth: Payer: Self-pay

## 2014-10-03 DIAGNOSIS — A630 Anogenital (venereal) warts: Secondary | ICD-10-CM

## 2014-10-03 NOTE — Telephone Encounter (Signed)
Taken care of, called to let her know and [facility]

## 2014-10-03 NOTE — Telephone Encounter (Signed)
Patient requesting a referral to a general surgeon. Per patient she spoke to dr copland about this on her last visit. At the time she wasn't sure. Patients call back number is [phone]

## 2014-10-12 ENCOUNTER — Encounter (HOSPITAL_COMMUNITY): Payer: Self-pay

## 2014-10-12 ENCOUNTER — Inpatient Hospital Stay (HOSPITAL_COMMUNITY)
Admission: AD | Admit: 2014-10-12 | Discharge: 2014-10-12 | Disposition: A | Discharge status: Home / Self Care | Payer: 59 | Source: Ambulatory Visit | Attending: Obstetrics and Gynecology | Admitting: Obstetrics and Gynecology

## 2014-10-12 DIAGNOSIS — F1190 Opioid use, unspecified, uncomplicated: Secondary | ICD-10-CM | POA: Insufficient documentation

## 2014-10-12 DIAGNOSIS — F1910 Other psychoactive substance abuse, uncomplicated: Secondary | ICD-10-CM

## 2014-10-12 DIAGNOSIS — O99322 Drug use complicating pregnancy, second trimester: Secondary | ICD-10-CM | POA: Insufficient documentation

## 2014-10-12 DIAGNOSIS — O0932 Supervision of pregnancy with insufficient antenatal care, second trimester: Secondary | ICD-10-CM | POA: Insufficient documentation

## 2014-10-12 DIAGNOSIS — R109 Unspecified abdominal pain: Secondary | ICD-10-CM | POA: Diagnosis present

## 2014-10-12 DIAGNOSIS — Z3A19 19 weeks gestation of pregnancy: Secondary | ICD-10-CM | POA: Insufficient documentation

## 2014-10-12 DIAGNOSIS — O99312 Alcohol use complicating pregnancy, second trimester: Secondary | ICD-10-CM

## 2014-10-12 DIAGNOSIS — F1010 Alcohol abuse, uncomplicated: Secondary | ICD-10-CM

## 2014-10-12 NOTE — MAU Note (Signed)
Pt states had positive upt yesterday. Last cycle in March. Denies bleeding. Mild cramping. Pt states she has been drinking, etc and is concerned baby may not be ok. States she takes percocet 10's with alcohol, and mixes cough syrup with sprite.

## 2014-10-12 NOTE — MAU Provider Note (Signed)
  History     CSN: [id number]  Arrival date and time: 10/12/14 0940   None     No chief complaint on file.  HPI 24 y.o. [id number] at 44w5dby LMP. Patient's last menstrual period was 05/27/2014 (approximate). Pt states she had a positive home pregnancy test yesterday, took test because she "felt flutters". No pain or bleeding. Pt states she has been drinking alcohol and taking Percocet (last use about 1.5 weeks ago) and drinking cough syrup mixed with Sprite on the weekends. She is using these substances only occasionally. Pt stats she plans to follow up with a private OB/GYN, but is concerned about the baby d/t her substance use.   Past Medical History  Diagnosis Date  . As child and both parents have hypertension but none now   . HPV in female   . Perineal abscess   . External hemorrhoids   . Anemia   . Cholelithiasis     Past Surgical History  Procedure Laterality Date  . Cesarean section  10/21/2010    Procedure: CESAREAN SECTION;  Surgeon: TCatha Brow  [facility];  Service: Gynecology;  Laterality: N/A;    Family History  Problem Relation Age of Onset  . Hypertension Mother   . Cervical cancer Mother   . Hypertension Father     History  Substance Use Topics  . Smoking status: Never Smoker   . Smokeless tobacco: Not on file  . Alcohol Use: Yes    Allergies: No Known Allergies  Prescriptions prior to admission  Medication Sig Dispense Refill Last Dose  . fluconazole (DIFLUCAN) 150 MG tablet Take 1 tablet (150 mg total) by mouth once. 2 tablet 0   . HYDROcodone-acetaminophen (NORCO/VICODIN) 5-325 MG per tablet Take 1-2 tablets by mouth every 4 (four) hours as needed for moderate pain. (Patient not taking: Reported on 04/30/2014) 30 tablet 0 Not Taking  . imiquimod (ALDARA) 5 % cream Apply topically 3 (three) times a week. 12 each 0 Taking  . sulfamethoxazole-trimethoprim (BACTRIM DS) 800-160 MG per tablet Take 1 tablet by mouth 2 (two) times daily. 14 tablet 0      Review of Systems  Constitutional: Negative.   Respiratory: Negative.   Cardiovascular: Negative.   Gastrointestinal: Negative for nausea, vomiting, abdominal pain, diarrhea and constipation.  Genitourinary: Negative for dysuria, urgency, frequency, hematuria and flank pain.       Negative for vaginal bleeding, cramping/contractions  Musculoskeletal: Negative.   Neurological: Negative.   Psychiatric/Behavioral: Negative.    Physical Exam   Height 5' 3"  (1.6 m), weight 174 lb 2 oz (78.983 kg), last menstrual period 05/27/2014.  Physical Exam  Nursing note and vitals reviewed. Constitutional: She is oriented to person, place, and time. She appears well-developed and well-nourished. No distress.  Cardiovascular: Normal rate.   Respiratory: Effort normal.  Neurological: She is alert and oriented to person, place, and time.  Skin: Skin is warm and dry.  Psychiatric: She has a normal mood and affect.   + FHR, 19 week FH MAU Course  Procedures    Assessment and Plan  24y.o. [id number] 176w5d/ substance abuse during this pregnancy, no prenatal care Pt has plan for prenatal care, advised to schedule appt ASAP Advised to stop substance use, only occasional at this point, no concern of dependence based on patient report of use Outpatient anatomy u/s ordered   [facility]/17/2016, 10:52 AM

## 2014-10-13 ENCOUNTER — Telehealth: Payer: Self-pay

## 2014-10-13 NOTE — Telephone Encounter (Signed)
Patient found out this weekend that she is [redacted] weeks pregnant. She told nurse midwife at [facility] yesterday that her LMP was 05/2014 per ER note.  Today she told me she had been having regular periods(She went because she was concerned regarding substance abuse during pregnancy and affect on baby.) . She called today because she wanted to know if we would accept her that far along for OB care. I explained to her that we no longer do Obstetrics and she will have to see an OB doctor for that.  I gave her the name Of [facility] but told her there were other good OB's in town and she might want to call around and see can see her soonest.    She asked if the laser surgery for condyloma could still be performed while she was pregnant?

## 2014-10-13 NOTE — Telephone Encounter (Signed)
Message left

## 2014-10-16 NOTE — Telephone Encounter (Signed)
Telephone call, reviewed importance of starting prenatal care instructed to call for appointment. [facility]. Employee of [employer], reviewed prenatal programs to sign up for.

## 2014-10-28 ENCOUNTER — Telehealth: Payer: Self-pay

## 2014-10-28 ENCOUNTER — Encounter: Payer: Self-pay | Admitting: Obstetrics and Gynecology

## 2014-10-28 ENCOUNTER — Ambulatory Visit (INDEPENDENT_AMBULATORY_CARE_PROVIDER_SITE_OTHER): Payer: 59 | Admitting: Obstetrics and Gynecology

## 2014-10-28 VITALS — BP 134/79 | HR 89 | Temp 98.4°F | Wt 179.3 lb

## 2014-10-28 DIAGNOSIS — O0930 Supervision of pregnancy with insufficient antenatal care, unspecified trimester: Secondary | ICD-10-CM | POA: Insufficient documentation

## 2014-10-28 DIAGNOSIS — Z98891 History of uterine scar from previous surgery: Secondary | ICD-10-CM | POA: Insufficient documentation

## 2014-10-28 DIAGNOSIS — O0932 Supervision of pregnancy with insufficient antenatal care, second trimester: Secondary | ICD-10-CM | POA: Diagnosis not present

## 2014-10-28 DIAGNOSIS — F1290 Cannabis use, unspecified, uncomplicated: Secondary | ICD-10-CM | POA: Insufficient documentation

## 2014-10-28 DIAGNOSIS — O219 Vomiting of pregnancy, unspecified: Secondary | ICD-10-CM

## 2014-10-28 DIAGNOSIS — F1210 Cannabis abuse, uncomplicated: Secondary | ICD-10-CM

## 2014-10-28 DIAGNOSIS — O99322 Drug use complicating pregnancy, second trimester: Secondary | ICD-10-CM

## 2014-10-28 DIAGNOSIS — Z3490 Encounter for supervision of normal pregnancy, unspecified, unspecified trimester: Secondary | ICD-10-CM

## 2014-10-28 LAB — POCT URINALYSIS DIP (DEVICE)
Bilirubin Urine: NEGATIVE
Glucose, UA: NEGATIVE mg/dL
Hgb urine dipstick: NEGATIVE
Ketones, ur: NEGATIVE mg/dL
Leukocytes, UA: NEGATIVE
Nitrite: NEGATIVE
Protein, ur: NEGATIVE mg/dL
Specific Gravity, Urine: 1.025 (ref 1.005–1.030)
Urobilinogen, UA: 1 mg/dL (ref 0.0–1.0)
pH: 6 (ref 5.0–8.0)

## 2014-10-28 MED ORDER — PRENATAL VITAMINS 0.8 MG PO TABS: 1.0000 | ORAL_TABLET | Freq: Every day | ORAL | Status: DC

## 2014-10-28 MED ORDER — PYRIDOXINE HCL 25 MG PO TABS
25.0000 mg | ORAL_TABLET | Freq: Four times a day (QID) | ORAL | Status: DC | PRN
Start: 2014-10-28 — End: 2014-11-14

## 2014-10-28 MED ORDER — DOXYLAMINE SUCCINATE (SLEEP) 25 MG PO TABS: 25.0000 mg | ORAL_TABLET | Freq: Four times a day (QID) | ORAL | Status: DC | PRN

## 2014-10-28 MED ORDER — CEPHALEXIN 500 MG PO CAPS: 500.0000 mg | ORAL_CAPSULE | Freq: Three times a day (TID) | ORAL | Status: DC

## 2014-10-28 NOTE — Progress Notes (Signed)
Subjective:  Amber Blanchard is a 24 y.o. G3P1011 at 49w0dbeing seen today for ongoing prenatal care.  Patient reports no complaints.  Contractions: Not present.  Vag. Bleeding: None. Movement: Present. Denies leaking of fluid.   The following portions of the patient's history were reviewed and updated as appropriate: allergies, current medications, past family history, past medical history, past social history, past surgical history and problem list.   Objective:   Filed Vitals:   10/28/14 1522  BP: 134/79  Pulse: 89  Temp: 98.4 F (36.9 C)  Weight: 179 lb 4.8 oz (81.33 kg)    Fetal Status: Fetal Heart Rate (bpm): 150   Movement: Present     General:  Alert, oriented and cooperative. Patient is in no acute distress.  Skin: Skin is warm and dry. No rash noted.   Cardiovascular: Normal heart rate noted  Respiratory: Normal respiratory effort, no problems with respiration noted  Abdomen: Soft, gravid, appropriate for gestational age. Pain/Pressure: Present     Vaginal: Vag. Bleeding: None.       Cervix: Not evaluated        Extremities: Normal range of motion.  Edema: None  Mental Status: Normal mood and affect. Normal behavior. Normal judgment and thought content.   Urinalysis:      Assessment and Plan:  Pregnancy: G3P1011 at 223w0d1. Late prenatal care affecting pregnancy in second trimester - Prenatal Profile - Hemoglobinopathy evaluation - Culture, OB Urine - Prescript Monitor Profile(19) - GC/chlamydia probe amp, urine - [place] + 14 Wk; Future (unsure dating)  3. Nausea/vomiting in pregnancy Says using mj to treat this. Currently only nauseaus - stop MJ as below; try doxylamine/b6 - f/u UDS  4. Marijuana use cousneled regarding ill effects, counseled to cut down/stop  5. Hx PLTCS for arrest of 2nd stage - desires repeat c/s, needs consent  Preterm labor symptoms and general obstetric precautions including but not limited to vaginal bleeding, contractions,  leaking of fluid and fetal movement were reviewed in detail with the patient. Please refer to After Visit Summary for other counseling recommendations.  Return in about 4 weeks (around 11/25/2014).   NoGwynne EdingerMD

## 2014-10-28 NOTE — Telephone Encounter (Signed)
Patient is calling about getting an alternative prescription to the most recently prescribed medication because she can't take it while pregnant. Please advise! [phone]

## 2014-10-28 NOTE — Progress Notes (Signed)
Patient unsure of LMP, states she had a regular period in June; reports pelvic pain/pressure

## 2014-10-28 NOTE — Telephone Encounter (Signed)
Called her back- she has had some issues with recurrent perineal abscesses.  I had given her an rx for septra at that time.  She did not take it because she did not feel like she needed it.  However over the last week or so the area has gotten tender again, and she filled the abx.  However she decided not to take it after all because she is also not pregnant (found out 3 weeks ago) and the medication said to avoid in pregnancy.  She would like to have another abx if possible that would be safe for her to use  Advised that I will rx keflex for her, which is considered safe for pregnancy.  She will seek care if she feels that the area is not responding and needs to be opened up.  At this time she does not think it needs to be opened up

## 2014-10-28 NOTE — Telephone Encounter (Signed)
Pt called but we got disconnected before I could get more details. Looks like she was worried about taking Septra while pregnant. She was not SA at her last OV. To Dr. Patsy Lager for review.

## 2014-10-29 LAB — PRENATAL PROFILE (SOLSTAS)
Antibody Screen: NEGATIVE
Basophils Absolute: 0 10*3/uL (ref 0.0–0.1)
Basophils Relative: 0 % (ref 0–1)
Eosinophils Absolute: 0.1 10*3/uL (ref 0.0–0.7)
Eosinophils Relative: 1 % (ref 0–5)
HCT: 31.8 % — ABNORMAL LOW (ref 36.0–46.0)
HIV 1&2 Ab, 4th Generation: NONREACTIVE
Hemoglobin: 10.6 g/dL — ABNORMAL LOW (ref 12.0–15.0)
Hepatitis B Surface Ag: NEGATIVE
Lymphocytes Relative: 17 % (ref 12–46)
Lymphs Abs: 1.9 10*3/uL (ref 0.7–4.0)
MCH: 28.8 pg (ref 26.0–34.0)
MCHC: 33.3 g/dL (ref 30.0–36.0)
MCV: 86.4 fL (ref 78.0–100.0)
MPV: 9.6 fL (ref 8.6–12.4)
Monocytes Absolute: 0.7 10*3/uL (ref 0.1–1.0)
Monocytes Relative: 6 % (ref 3–12)
Neutro Abs: 8.3 10*3/uL — ABNORMAL HIGH (ref 1.7–7.7)
Neutrophils Relative %: 76 % (ref 43–77)
Platelets: 284 10*3/uL (ref 150–400)
RBC: 3.68 MIL/uL — ABNORMAL LOW (ref 3.87–5.11)
RDW: 14.6 % (ref 11.5–15.5)
Rh Type: POSITIVE
Rubella: 1.57 Index — ABNORMAL HIGH (ref <0.90)
WBC: 10.9 10*3/uL — ABNORMAL HIGH (ref 4.0–10.5)

## 2014-10-29 LAB — GC/CHLAMYDIA PROBE AMP, URINE
Chlamydia, Swab/Urine, PCR: NEGATIVE
GC Probe Amp, Urine: NEGATIVE

## 2014-10-30 LAB — CULTURE, OB URINE
Colony Count: NO GROWTH
Organism ID, Bacteria: NO GROWTH

## 2014-10-30 LAB — HEMOGLOBINOPATHY EVALUATION
Hemoglobin Other: 0 %
Hgb A2 Quant: 1.2 % — ABNORMAL LOW (ref 2.2–3.2)
Hgb A: 98.8 % — ABNORMAL HIGH (ref 96.8–97.8)
Hgb F Quant: 0 % (ref 0.0–2.0)
Hgb S Quant: 0 %

## 2014-10-31 ENCOUNTER — Telehealth: Payer: Self-pay | Admitting: *Deleted

## 2014-10-31 NOTE — Telephone Encounter (Signed)
Pt contacted the clinic for results of labs done at initial prenatal visit.  Results given, pt verbalizes understanding and has no further questions/

## 2014-11-01 LAB — CANNABANOIDS (GC/LC/MS), URINE: THC-COOH (GC/LC/MS), ur confirm: 241 ng/mL — AB (ref <5)

## 2014-11-04 ENCOUNTER — Other Ambulatory Visit: Payer: Self-pay

## 2014-11-04 LAB — PRESCRIPTION MONITORING PROFILE (19 PANEL)
Amphetamine/Meth: NEGATIVE ng/mL
Barbiturate Screen, Urine: NEGATIVE ng/mL
Benzodiazepine Screen, Urine: NEGATIVE ng/mL
Buprenorphine, Urine: NEGATIVE ng/mL
Carisoprodol, Urine: NEGATIVE ng/mL
Cocaine Metabolites: NEGATIVE ng/mL
Creatinine, Urine: 174.54 mg/dL (ref >20.0)
Fentanyl, Ur: NEGATIVE ng/mL
MDMA URINE: NEGATIVE ng/mL
Meperidine, Ur: NEGATIVE ng/mL
Methadone Screen, Urine: NEGATIVE ng/mL
Methaqualone: NEGATIVE ng/mL
Nitrites, Initial: NEGATIVE ug/mL
Opiate Screen, Urine: NEGATIVE ng/mL
Oxycodone Screen, Ur: NEGATIVE ng/mL
Phencyclidine, Ur: NEGATIVE ng/mL
Propoxyphene: NEGATIVE ng/mL
Tapentadol, urine: NEGATIVE ng/mL
Tramadol Scrn, Ur: NEGATIVE ng/mL
Zolpidem, Urine: NEGATIVE ng/mL
pH, Initial: 6.5 pH (ref 4.5–8.9)

## 2014-11-05 ENCOUNTER — Telehealth: Payer: Self-pay | Admitting: *Deleted

## 2014-11-05 NOTE — Telephone Encounter (Signed)
Received call transferred from front desk.  Patient has concerns about her visit in our office.  We talked through her concerns and I reviewed her lab results with her.  We also discussed how to get her questions answered in the future and when she would want to go to Maternity Admissions for evaluation.  I have moved the patient's appointment in our office and her U/S to 11/14/14 instead of 11/25/14.  Patient states understanding.  I will follow up with staff on her concerns.    Patient states she has bartholen's cysts and has an appointment with [facility] - Dr. Marcello Moores on 11/14/14.  I have called Dr. Manon Hilding office to verify the appointment and reason for the appointment.  Patient's appointment is scheduled for 11/14/14 at 11:20 am.  Patient is being seen for hemorrhoids, rectal fissure and rectal pain.  I have phoned patient to discuss this as this is not what she originally told me.  Will await return call from patient.

## 2014-11-11 ENCOUNTER — Other Ambulatory Visit: Payer: Self-pay | Admitting: General Practice

## 2014-11-11 DIAGNOSIS — Z1389 Encounter for screening for other disorder: Secondary | ICD-10-CM

## 2014-11-11 DIAGNOSIS — Z3A24 24 weeks gestation of pregnancy: Secondary | ICD-10-CM

## 2014-11-11 DIAGNOSIS — O0932 Supervision of pregnancy with insufficient antenatal care, second trimester: Secondary | ICD-10-CM

## 2014-11-13 NOTE — Telephone Encounter (Signed)
Left another message on patient's cell phone [phone] to give me a call as I have a few questions for her about her appointment tomorrow.

## 2014-11-13 NOTE — Telephone Encounter (Signed)
Patient returned my call.  I explained the below situation.  Have encouraged patient to cancel her appointment with [facility] for tomorrow.  We will look at her bartholen's cyst and genital warts to see if there is treatment that we can provide to help throughout her pregnancy.  If the patient needs referral to [facility], we will need to refer after her delivery as they have told me they will do no surgical intervention until after her baby is delivered.  Patient states understanding.

## 2014-11-14 ENCOUNTER — Ambulatory Visit (HOSPITAL_COMMUNITY)
Admission: RE | Admit: 2014-11-14 | Discharge: 2014-11-14 | Disposition: A | Discharge status: Home / Self Care | Payer: 59 | Source: Ambulatory Visit | Attending: Obstetrics and Gynecology | Admitting: Obstetrics and Gynecology

## 2014-11-14 ENCOUNTER — Other Ambulatory Visit: Payer: Self-pay | Admitting: General Practice

## 2014-11-14 ENCOUNTER — Ambulatory Visit (INDEPENDENT_AMBULATORY_CARE_PROVIDER_SITE_OTHER): Payer: 59 | Admitting: Family

## 2014-11-14 VITALS — BP 124/76 | HR 78 | Temp 98.5°F | Wt 185.3 lb

## 2014-11-14 DIAGNOSIS — A630 Anogenital (venereal) warts: Secondary | ICD-10-CM | POA: Diagnosis not present

## 2014-11-14 DIAGNOSIS — Z1389 Encounter for screening for other disorder: Secondary | ICD-10-CM

## 2014-11-14 DIAGNOSIS — Z3490 Encounter for supervision of normal pregnancy, unspecified, unspecified trimester: Secondary | ICD-10-CM

## 2014-11-14 DIAGNOSIS — O34219 Maternal care for unspecified type scar from previous cesarean delivery: Secondary | ICD-10-CM

## 2014-11-14 DIAGNOSIS — R8271 Bacteriuria: Secondary | ICD-10-CM

## 2014-11-14 DIAGNOSIS — Z3A24 24 weeks gestation of pregnancy: Secondary | ICD-10-CM | POA: Insufficient documentation

## 2014-11-14 DIAGNOSIS — O0932 Supervision of pregnancy with insufficient antenatal care, second trimester: Secondary | ICD-10-CM | POA: Diagnosis present

## 2014-11-14 DIAGNOSIS — O2342 Unspecified infection of urinary tract in pregnancy, second trimester: Secondary | ICD-10-CM

## 2014-11-14 DIAGNOSIS — O3421 Maternal care for scar from previous cesarean delivery: Secondary | ICD-10-CM | POA: Insufficient documentation

## 2014-11-14 DIAGNOSIS — O98312 Other infections with a predominantly sexual mode of transmission complicating pregnancy, second trimester: Secondary | ICD-10-CM | POA: Diagnosis not present

## 2014-11-14 DIAGNOSIS — Z3492 Encounter for supervision of normal pregnancy, unspecified, second trimester: Secondary | ICD-10-CM | POA: Diagnosis not present

## 2014-11-14 LAB — POCT URINALYSIS DIP (DEVICE)
Bilirubin Urine: NEGATIVE
Glucose, UA: NEGATIVE mg/dL
Hgb urine dipstick: NEGATIVE
Ketones, ur: NEGATIVE mg/dL
Nitrite: NEGATIVE
Protein, ur: NEGATIVE mg/dL
Specific Gravity, Urine: 1.02 (ref 1.005–1.030)
Urobilinogen, UA: 0.2 mg/dL (ref 0.0–1.0)
pH: 7 (ref 5.0–8.0)

## 2014-11-14 MED ORDER — FLUCONAZOLE 150 MG PO TABS: 150.0000 mg | ORAL_TABLET | Freq: Once | ORAL | Status: DC

## 2014-11-14 MED ORDER — NITROFURANTOIN MONOHYD MACRO 100 MG PO CAPS: 100.0000 mg | ORAL_CAPSULE | Freq: Two times a day (BID) | ORAL | Status: DC

## 2014-11-14 NOTE — Progress Notes (Signed)
Subjective:  Amber Blanchard is a 24 y.o. G3P1011 at 68w3dbeing seen today for ongoing prenatal care.  Patient reports genital warts desiring to have removed.  Also history of having recurrent left bartholin's cyst.  No cyst at this time.  [facility], [facility] and rectal pain.  [name] also thought they would address bartholins cyst and warts.    Contractions: Not present.  Vag. Bleeding: None. Movement: Present. Denies leaking of fluid. No report of UTI symptoms.    The following portions of the patient's history were reviewed and updated as appropriate: allergies, current medications, past family history, past medical history, past social history, past surgical history and problem list.   Objective:   Filed Vitals:   11/14/14 0821  BP: 124/76  Pulse: 78  Temp: 98.5 F (36.9 C)  Weight: 185 lb 5 oz (84.057 kg)    Fetal Status: Fetal Heart Rate (bpm): 147 Fundal Height: 25 cm Movement: Present     General:  Alert, oriented and cooperative. Patient is in no acute distress.  Skin: Skin is warm and dry. No rash noted.   Cardiovascular: Normal heart rate noted  Respiratory: Normal respiratory effort, no problems with respiration noted  Abdomen: Soft, gravid, appropriate for gestational age. Pain/Pressure: Absent     Pelvic: Vag. Bleeding: None     Cervical exam deferred      Multiple genital warts seen along rectal area.  No bartholin's cyst visualized.  Cystic scarring seen.    Extremities: Normal range of motion.  Edema: None  Mental Status: Normal mood and affect. Normal behavior. Normal judgment and thought content.   Urinalysis: Urine Protein: Negative Urine Glucose: Negative  TCA Treatment: Genital wart area outlined with surgical gel; TCA applied to genital warts.  Pt tolerated procedure well.    Assessment and Plan:  Pregnancy: G3P1011 at 23w3d1. Supervision of normal pregnancy, unspecified trimester -Ultrasound on  11/14/14  2. Bacteria in urine - Culture, OB Urine - RX Macrobid  3.  Genital Warts -TCA applied; consulted with Dr. CoElly Modenaho visualized lesions with me; have pt return weekly until noticed results  4.  Hemorrhoids/Rectal Fissure - Schedule appt with [facility] during postpartum period  Preterm labor symptoms and general obstetric precautions including but not limited to vaginal bleeding, contractions, leaking of fluid and fetal movement were reviewed in detail with the patient. Please refer to After Visit Summary for other counseling recommendations.   Return in about 4 weeks (around 12/12/2014).   WaVenia Carbon Michiel CowboyCNM

## 2014-11-14 NOTE — Progress Notes (Signed)
Breastfeeding tip of the week reviewed. Leukocytes: Moderate

## 2014-11-15 LAB — CULTURE, OB URINE: Colony Count: 75000

## 2014-11-17 ENCOUNTER — Ambulatory Visit (HOSPITAL_COMMUNITY): Payer: 59

## 2014-11-25 ENCOUNTER — Encounter: Payer: 59 | Admitting: Obstetrics and Gynecology

## 2014-11-25 ENCOUNTER — Ambulatory Visit (HOSPITAL_COMMUNITY): Payer: 59

## 2014-12-04 ENCOUNTER — Telehealth: Payer: Self-pay | Admitting: *Deleted

## 2014-12-04 NOTE — Telephone Encounter (Signed)
Pt left message requesting to know her exact due date and when she will have her C/S so that she can turn in her FMLA papers.  I called pt back and provided the requested information. I stated that her due date is 03/03/15.  She will have the repeat C/S one week prior to her due date. Although the surgery is not yet scheduled she can expect it to be on 02/24/15.  I advised that she should bring her FMLA papers to [place] for completion.   Pt voiced understanding of all information and instructions given.

## 2014-12-11 ENCOUNTER — Telehealth: Payer: Self-pay | Admitting: *Deleted

## 2014-12-11 NOTE — Telephone Encounter (Signed)
Message left on home phone number, cell number out of service.

## 2014-12-11 NOTE — Telephone Encounter (Signed)
Patient is pregnant, baby due in Dec. States she wants tubal ligation, seeking OB care at [facility], states she [facility]'t [facility] the [facility] to [facility] this, rather have a provider here to do this. Pt is having C-section, Is this possible Dr.Fontaine is back up MD? Please advise

## 2014-12-11 NOTE — Telephone Encounter (Signed)
Victorino Dike, the phone number you gave me does not accept incoming calls

## 2014-12-11 NOTE — Telephone Encounter (Signed)
Izora Gala call patient at [phone]

## 2014-12-12 NOTE — Telephone Encounter (Signed)
Per Amber Blanchard verbal conversation best if patient has done at [employer]. I called pt and informed patient with this.

## 2014-12-17 ENCOUNTER — Encounter: Payer: Self-pay | Admitting: Obstetrics and Gynecology

## 2014-12-17 ENCOUNTER — Ambulatory Visit (INDEPENDENT_AMBULATORY_CARE_PROVIDER_SITE_OTHER): Payer: 59 | Admitting: Obstetrics and Gynecology

## 2014-12-17 VITALS — BP 127/78 | HR 84 | Temp 98.6°F | Wt 193.2 lb

## 2014-12-17 DIAGNOSIS — Z98891 History of uterine scar from previous surgery: Secondary | ICD-10-CM

## 2014-12-17 DIAGNOSIS — Z9889 Other specified postprocedural states: Secondary | ICD-10-CM

## 2014-12-17 DIAGNOSIS — Z3493 Encounter for supervision of normal pregnancy, unspecified, third trimester: Secondary | ICD-10-CM

## 2014-12-17 NOTE — Progress Notes (Signed)
Subjective:  Amber Blanchard is a 24 y.o. G3P1011 at [redacted]w[redacted]d being seen today for ongoing prenatal care.  Patient reports no complaints.  Contractions: Not present.  Vag. Bleeding: None. Movement: Present. Denies leaking of fluid.   The following portions of the patient's history were reviewed and updated as appropriate: allergies, current medications, past family history, past medical history, past social history, past surgical history and problem list.   Objective:   Filed Vitals:   12/17/14 1453 12/17/14 1509  BP: 145/91 127/78  Pulse: 84   Temp: 98.6 F (37 C)   Weight: 193 lb 3.2 oz (87.635 kg)     Fetal Status: Fetal Heart Rate (bpm): 152 Fundal Height: 29 cm Movement: Present     General:  Alert, oriented and cooperative. Patient is in no acute distress.  Skin: Skin is warm and dry. No rash noted.   Cardiovascular: Normal heart rate noted  Respiratory: Normal respiratory effort, no problems with respiration noted  Abdomen: Soft, gravid, appropriate for gestational age. Pain/Pressure: Present     Pelvic: Vag. Bleeding: None     Cervical exam deferred        Extremities: Normal range of motion.  Edema: None  Mental Status: Normal mood and affect. Normal behavior. Normal judgment and thought content.   Urinalysis:      Assessment and Plan:  Pregnancy: G3P1011 at [redacted]w[redacted]d  1. Supervision of normal pregnancy, third trimester Patient is doing well She will return for glucola and labs prior to next visit Discussed 25 lb weight gain thus far, as well as proper nutrition and incorporating exercise in pregnancy  2. History of cesarean section Desires repeat with BTL Will forward information for scheduling  Preterm labor symptoms and general obstetric precautions including but not limited to vaginal bleeding, contractions, leaking of fluid and fetal movement were reviewed in detail with the patient. Please refer to After Visit Summary for other counseling recommendations.  Return in  about 2 weeks (around 12/31/2014).   Catalina Antigua, MD

## 2014-12-17 NOTE — Progress Notes (Signed)
Patient concerned about weight gain. We discussed she is at the upper limit of expected gain. We discussed stratagies to manage further weight gain. Amber Blanchard declines to have 1 hr gtt today- states she can't stay that long. I instructed her to reschedule that another day as a lab visit. She voices understanding.

## 2014-12-26 ENCOUNTER — Ambulatory Visit (HOSPITAL_COMMUNITY): Payer: 59

## 2014-12-30 ENCOUNTER — Ambulatory Visit (INDEPENDENT_AMBULATORY_CARE_PROVIDER_SITE_OTHER): Payer: 59 | Admitting: Advanced Practice Midwife

## 2014-12-30 VITALS — BP 129/69 | HR 79 | Temp 98.2°F | Wt 199.8 lb

## 2014-12-30 DIAGNOSIS — Z3493 Encounter for supervision of normal pregnancy, unspecified, third trimester: Secondary | ICD-10-CM

## 2014-12-30 DIAGNOSIS — Z23 Encounter for immunization: Secondary | ICD-10-CM | POA: Diagnosis not present

## 2014-12-30 LAB — CBC
HCT: 30.9 % — ABNORMAL LOW (ref 36.0–46.0)
Hemoglobin: 10.5 g/dL — ABNORMAL LOW (ref 12.0–15.0)
MCH: 29.2 pg (ref 26.0–34.0)
MCHC: 34 g/dL (ref 30.0–36.0)
MCV: 86.1 fL (ref 78.0–100.0)
MPV: 10 fL (ref 8.6–12.4)
Platelets: 302 10*3/uL (ref 150–400)
RBC: 3.59 MIL/uL — ABNORMAL LOW (ref 3.87–5.11)
RDW: 13.7 % (ref 11.5–15.5)
WBC: 10.8 10*3/uL — ABNORMAL HIGH (ref 4.0–10.5)

## 2014-12-30 MED ORDER — TETANUS-DIPHTH-ACELL PERTUSSIS 5-2.5-18.5 LF-MCG/0.5 IM SUSP
0.5000 mL | Freq: Once | INTRAMUSCULAR | Status: AC
  Administered 2014-12-30: 0.5 mL via INTRAMUSCULAR

## 2014-12-30 NOTE — Progress Notes (Signed)
Subjective:  Amber Blanchard is a 24 y.o. G3P1011 at 82w0dbeing seen today for ongoing prenatal care.  Patient reports no complaints.  Contractions: Not present.  Vag. Bleeding: None. Movement: Present. Denies leaking of fluid.   The following portions of the patient's history were reviewed and updated as appropriate: allergies, current medications, past family history, past medical history, past social history, past surgical history and problem list.   Objective:   Filed Vitals:   12/30/14 1517  BP: 129/69  Pulse: 79  Temp: 98.2 F (36.8 C)  Weight: 199 lb 12.8 oz (90.629 kg)    Fetal Status: Fetal Heart Rate (bpm): 152 Fundal Height: 31 cm Movement: Present     General:  Alert, oriented and cooperative. Patient is in no acute distress.  Skin: Skin is warm and dry. No rash noted.   Cardiovascular: Normal heart rate noted  Respiratory: Normal respiratory effort, no problems with respiration noted  Abdomen: Soft, gravid, appropriate for gestational age. Pain/Pressure: Present     Pelvic: Vag. Bleeding: None     Cervical exam deferred        Extremities: Normal range of motion.  Edema: None  Mental Status: Normal mood and affect. Normal behavior. Normal judgment and thought content.   Urinalysis:      Assessment and Plan:  Pregnancy: G3P1011 at 359w0d1. Needs flu shot - Flu Vaccine QUAD 36+ mos IM; Standing - Flu Vaccine QUAD 36+ mos IM  2. Need for Tdap vaccination  - Tdap (BOOSTRIX) injection 0.5 mL; Inject 0.5 mLs into the muscle once.  3. Supervision of normal pregnancy, third trimester - Glucose Tolerance, 1 HR (50g) w/o Fasting - HIV antibody (with reflex) - RPR - CBC  Preterm labor symptoms and general obstetric precautions including but not limited to vaginal bleeding, contractions, leaking of fluid and fetal movement were reviewed in detail with the patient. Please refer to After Visit Summary for other counseling recommendations.  Return in about 2 weeks  (around 01/13/2015).   LiElvera MariaCNM

## 2014-12-30 NOTE — Progress Notes (Signed)
Breastfeeding tip of the week reviewed Flu and tdap today 1hr gtt and 28 week labs today

## 2014-12-31 LAB — RPR

## 2014-12-31 LAB — GLUCOSE TOLERANCE, 1 HOUR (50G) W/O FASTING: Glucose, 1 Hour GTT: 68 mg/dL — ABNORMAL LOW (ref 70–140)

## 2014-12-31 LAB — HIV ANTIBODY (ROUTINE TESTING W REFLEX): HIV 1&2 Ab, 4th Generation: NONREACTIVE

## 2015-01-06 ENCOUNTER — Telehealth: Payer: Self-pay | Admitting: *Deleted

## 2015-01-06 NOTE — Telephone Encounter (Addendum)
Pt left message requesting lab results from last week.  Returned pt's call and left message on her personal voice mail stating that all test results were normal. She amy call back if she has additional questions or discuss at her next scheduled visit on 01/13/15.

## 2015-01-13 ENCOUNTER — Encounter: Payer: 59 | Admitting: Certified Nurse Midwife

## 2015-01-13 ENCOUNTER — Telehealth: Payer: Self-pay | Admitting: Certified Nurse Midwife

## 2015-01-13 NOTE — Telephone Encounter (Signed)
Called both numbers listed on account to inform patient of office closing early, and her appointment being moved up. Left message for her to call us back. Also sent e-mail informing her of this change.

## 2015-01-17 ENCOUNTER — Telehealth: Payer: Self-pay | Admitting: *Deleted

## 2015-01-17 DIAGNOSIS — O219 Vomiting of pregnancy, unspecified: Secondary | ICD-10-CM

## 2015-01-17 NOTE — Telephone Encounter (Signed)
Received message left on nurse line on 01/16/15 at 1015.  Patient states she is having some nausea and would like to know if the doctor could phone something in for her.  Requests a return call to [phone].

## 2015-01-19 MED ORDER — PROMETHAZINE HCL 12.5 MG PO TABS: 12.5000 mg | ORAL_TABLET | Freq: Four times a day (QID) | ORAL | Status: DC | PRN

## 2015-01-19 NOTE — Telephone Encounter (Signed)
Spoke with Dr. Si Raider he stated that patient may have Phenergan 12.5 mg #30 with no refills. Called patient back and heard message that phone is not in service. Rx to pharmacy.

## 2015-01-27 ENCOUNTER — Ambulatory Visit (INDEPENDENT_AMBULATORY_CARE_PROVIDER_SITE_OTHER): Payer: 59 | Admitting: Certified Nurse Midwife

## 2015-01-27 ENCOUNTER — Encounter: Payer: 59 | Admitting: Certified Nurse Midwife

## 2015-01-27 ENCOUNTER — Encounter: Payer: Self-pay | Admitting: *Deleted

## 2015-01-27 VITALS — BP 145/75 | HR 77 | Temp 98.7°F | Wt 208.6 lb

## 2015-01-27 DIAGNOSIS — O163 Unspecified maternal hypertension, third trimester: Secondary | ICD-10-CM

## 2015-01-27 DIAGNOSIS — Z3493 Encounter for supervision of normal pregnancy, unspecified, third trimester: Secondary | ICD-10-CM

## 2015-01-27 DIAGNOSIS — O133 Gestational [pregnancy-induced] hypertension without significant proteinuria, third trimester: Secondary | ICD-10-CM | POA: Diagnosis not present

## 2015-01-27 LAB — POCT URINALYSIS DIP (DEVICE)
Bilirubin Urine: NEGATIVE
Glucose, UA: NEGATIVE mg/dL
Hgb urine dipstick: NEGATIVE
Nitrite: NEGATIVE
Protein, ur: 30 mg/dL — AB
Specific Gravity, Urine: 1.025 (ref 1.005–1.030)
Urobilinogen, UA: 1 mg/dL (ref 0.0–1.0)
pH: 6 (ref 5.0–8.0)

## 2015-01-27 NOTE — Progress Notes (Signed)
Subjective:  Amber Blanchard is a 24 y.o. G3P1011 at 16w0dbeing seen today for ongoing prenatal care.   Patient reports no complaints.  Contractions: Not present. Vag. Bleeding: None.  Movement: Present. Denies leaking of fluid.   The following portions of the patient's history were reviewed and updated as appropriate: allergies, current medications, past family history, past medical history, past social history, past surgical history and problem list. Problem list updated.  Objective:   Filed Vitals:   01/27/15 1615  BP: 145/75  Pulse: 77  Temp: 98.7 F (37.1 C)  Weight: 94.62 kg (208 lb 9.6 oz)    Fetal Status: Fetal Heart Rate (bpm): 137   Movement: Present     General:  Alert, oriented and cooperative. Patient is in no acute distress.  Skin: Skin is warm and dry. No rash noted.   Cardiovascular: Normal heart rate noted  Respiratory: Normal respiratory effort, no problems with respiration noted  Abdomen: Soft, gravid, appropriate for gestational age. Pain/Pressure: Present     Pelvic: Vag. Bleeding: None    Cervical exam deferred        Extremities: Normal range of motion.  Edema: None  Mental Status: Normal mood and affect. Normal behavior. Normal judgment and thought content.   Urinalysis: Urine Protein: 1+ Urine Glucose: Negative  Assessment and Plan:  Pregnancy: G3P1011 at 360w0d1. Elevated blood pressure complicating pregnancy in third trimester, antepartum Patient had SBP>140, no symptoms. +1 proteinuria.  Will reevaluate in one week.  2. Supervision of normal pregnancy, third trimester Preterm labor symptoms and general obstetric precautions including but not limited to vaginal bleeding, contractions, leaking of fluid and fetal movement were reviewed in detail with the patient. Please refer to After Visit Summary for other counseling recommendations.  Return in about 1 week (around 02/03/2015).   LoLarey DaysCNM

## 2015-01-27 NOTE — Patient Instructions (Signed)
Group B streptococcus (GBS) is a type of bacteria often found in healthy women. GBS is not the same as the bacteria that causes strep throat. You may have GBS in your vagina, rectum, or bladder. GBS does not spread through sexual contact, but it can be passed to a baby during childbirth. This can be dangerous for your baby. It is not dangerous to you and usually does not cause any symptoms. Your health care provider may test you for GBS when your pregnancy is between 35 and 37 weeks. GBS is dangerous only during birth, so there is no need to test for it earlier. It is possible to have GBS during pregnancy and never pass it to your baby. If your test results are positive for GBS, your health care provider may recommend giving you antibiotic medicine during delivery to make sure your baby stays healthy. RISK FACTORS You are more likely to pass GBS to your baby if:   Your water breaks (ruptured membrane) or you go into labor before 37 weeks.  Your water breaks 18 hours before you deliver.  You passed GBS during a previous pregnancy.  You have a urinary tract infection caused by GBS any time during pregnancy.  You have a fever during labor. SYMPTOMS Most women who have GBS do not have any symptoms. If you have a urinary tract infection caused by GBS, you might have frequent or painful urination and fever. Babies who get GBS usually show symptoms within 7 days of birth. Symptoms may include:   Breathing problems.  Heart and blood pressure problems.  Digestive and kidney problems. DIAGNOSIS Routine screening for GBS is recommended for all pregnant women. A health care provider takes a sample of the fluid in your vagina and rectum with a swab. It is then sent to a lab to be checked for GBS. A sample of your urine may also be checked for the bacteria.  TREATMENT If you test positive for GBS, you may need treatment with an antibiotic medicine during labor. As soon as you go into labor, or as soon as  your membranes rupture, you will get the antibiotic medicine through an IV access. You will continue to get the medicine until after you give birth. You do not need antibiotic medicine if you are having a cesarean delivery.If your baby shows signs or symptoms of GBS after birth, your baby can also be treated with an antibiotic medicine. HOME CARE INSTRUCTIONS   Take all antibiotic medicine as prescribed by your health care provider. Only take medicine as directed.   Continue with prenatal visits and care.   Keep all follow-up appointments.  SEEK MEDICAL CARE IF:   You have pain when you urinate.   You have to urinate frequently.   You have a fever.  SEEK IMMEDIATE MEDICAL CARE IF:   Your membranes rupture.  You go into labor.   This information is not intended to replace advice given to you by your health care provider. Make sure you discuss any questions you have with your health care provider.   Document Released: 06/21/2007 Document Revised: 03/19/2013 Document Reviewed: 01/04/2013 Elsevier Interactive Patient Education [employer].

## 2015-01-29 ENCOUNTER — Telehealth: Payer: Self-pay

## 2015-01-29 NOTE — Telephone Encounter (Signed)
Patient called back. Actually her only question was does Dr. Loetta Rough do laser of condyloma. I told her he does do that. It is done outpatient at [facility] and once again he would need to examine her to see if that was indicated but he does indeed perform that type of surgery.

## 2015-01-29 NOTE — Telephone Encounter (Signed)
Patient called in voice mail stating she had questions about finding a doctor to treat genital warts. i called her back but got her voice mail. I told her I rec'd her message and I would recommend office visit to discuss with physician. I see that she already has one scheduled with Dr. Velvet BatheF in January. (Of note, patient did not mention but she is seeing OB currently and is [redacted] weeks pregnant.) I told her I am happy to talk with her if she still wants to but office visit is recommended.

## 2015-02-02 ENCOUNTER — Encounter (HOSPITAL_COMMUNITY): Payer: Self-pay | Admitting: *Deleted

## 2015-02-03 ENCOUNTER — Encounter: Payer: 59 | Admitting: Advanced Practice Midwife

## 2015-02-04 ENCOUNTER — Telehealth: Payer: Self-pay | Admitting: Obstetrics & Gynecology

## 2015-02-04 NOTE — Telephone Encounter (Signed)
I received a call from Amber Blanchard around 2:45pm she wanted to speak to Amber Blanchard about her surgery date (on this call it was Amber Blanchard herself calling)  I transferred her to Amber Blanchard before I Connected the call I said "Amber Blanchard I have Amber Blanchard on the line and I connected the call.    About 3:10 I received another call from Amber Blanchard (this time it was her Mother Amber Blanchard pretending to be Amber Blanchard, she wanted to speak to Amber Blanchard again, I gave her Amber Blanchard number this time because I tried to transfer but it was going to voicemail, I clicked back over and told her Amber Blanchard was out of the office and gave her the number to call back tomorrow, then she goes on and want to discuss her upcoming Appts. She wanted to know why her appts was scheduled with midwives and not the Doctor that was doing her surgery, I explained to her that all over our providers rotate in and out the office. She said she want to cancel all her upcoming appts and that she only want to see the Dr that was doing her surgery,  I told the patient's mother(Amber Blanchard) that was pretending to be Amber Blanchard that I will have Amber Blanchard the office Manger to call her in the morning to fully explain to her about her appts,she stared yelling and getting upset, I placed her on hold to see what I could work out,I got back on the phone and asked her if she could come in tomorrow 02-05-2015 at 9:00am to see Dr.Anyanwu she said "NO I CANNOT, I HAVE TO WORK I DON'T GET OFF UNTIL THE [place]" she then said she was off work all next week and she demanded to only see the Dr.that was doing her surgery she didn't want to be blind sided when she come in for her C-Section,  I then placed her on hold again, I asked my coworker Amber Blanchard to help me advise this matter, I then told Amber Blanchard that it was not Amber Blanchard herself that was on the phone, she agreed with me because we both knew what [place] voice sound like. Dr.Anyanuw will be her in the  office next week so I moved a patient around so that I could give Amber Blanchard an Appt, I offered her next Thursday at 9:05 with Dr.Anyanwu she took the appt.  The call lasted about 20 mins atleast trying to help this matter but her Mother was being very Rude on this phone call.

## 2015-02-05 ENCOUNTER — Telehealth: Payer: Self-pay | Admitting: *Deleted

## 2015-02-05 ENCOUNTER — Encounter: Payer: 59 | Admitting: Family

## 2015-02-05 NOTE — Telephone Encounter (Signed)
Again, left voicemail message for patient to give me a call.

## 2015-02-05 NOTE — Telephone Encounter (Signed)
Left voicemail message for patient to give me a call today if at all possible.  I left my direct office number.  Sent my chart message to patient as well.

## 2015-02-10 ENCOUNTER — Encounter: Payer: 59 | Admitting: Advanced Practice Midwife

## 2015-02-10 ENCOUNTER — Encounter: Payer: 59 | Admitting: Certified Nurse Midwife

## 2015-02-12 ENCOUNTER — Ambulatory Visit (INDEPENDENT_AMBULATORY_CARE_PROVIDER_SITE_OTHER): Payer: 59 | Admitting: Obstetrics & Gynecology

## 2015-02-12 VITALS — BP 127/78 | HR 95 | Temp 98.4°F | Wt 219.5 lb

## 2015-02-12 DIAGNOSIS — O133 Gestational [pregnancy-induced] hypertension without significant proteinuria, third trimester: Secondary | ICD-10-CM | POA: Diagnosis not present

## 2015-02-12 DIAGNOSIS — Z3493 Encounter for supervision of normal pregnancy, unspecified, third trimester: Secondary | ICD-10-CM

## 2015-02-12 DIAGNOSIS — Z113 Encounter for screening for infections with a predominantly sexual mode of transmission: Secondary | ICD-10-CM | POA: Diagnosis not present

## 2015-02-12 LAB — POCT URINALYSIS DIP (DEVICE)
Bilirubin Urine: NEGATIVE
Glucose, UA: NEGATIVE mg/dL
Hgb urine dipstick: NEGATIVE
Ketones, ur: NEGATIVE mg/dL
Nitrite: NEGATIVE
Protein, ur: NEGATIVE mg/dL
Specific Gravity, Urine: >=1.03 (ref 1.005–1.030)
Urobilinogen, UA: 1 mg/dL (ref 0.0–1.0)
pH: 6.5 (ref 5.0–8.0)

## 2015-02-12 NOTE — Progress Notes (Signed)
Subjective:  Amber Blanchard is a 24 y.o. G3P1011 at 56w2dbeing seen today for ongoing prenatal care.  Patient reports no complaints.  Contractions: Not present.  Vag. Bleeding: None. Movement: Present. Denies leaking of fluid.   The following portions of the patient's history were reviewed and updated as appropriate: allergies, current medications, past family history, past medical history, past social history, past surgical history and problem list. Problem list updated.  Objective:   Filed Vitals:   02/12/15 0943  BP: 127/78  Pulse: 95  Temp: 98.4 F (36.9 C)  Weight: 219 lb 8 oz (99.565 kg)    Fetal Status: Fetal Heart Rate (bpm): 120 Fundal Height: 37 cm Movement: Present     General:  Alert, oriented and cooperative. Patient is in no acute distress.  Skin: Skin is warm and dry. No rash noted.   Cardiovascular: Normal heart rate noted  Respiratory: Normal respiratory effort, no problems with respiration noted  Abdomen: Soft, gravid, appropriate for gestational age. Pain/Pressure: Present     Pelvic: Vag. Bleeding: None    Cervical exam deferred due to patient's request.  Extremities: Normal range of motion.  Edema: None  Mental Status: Normal mood and affect. Normal behavior. Normal judgment and thought content.   Urinalysis:    No urine sample at time of this note (check UA later)  Assessment and Plan:  Pregnancy: G3P1011 at 37w2d1. Supervision of normal pregnancy, third trimester Elevated BP last visit but not consistent.  Will continue to watch.  RCS and BTS scheduled for 02/24/15. - Culture, beta strep (group b only) - GC/Chlamydia probe amp ([place] symptoms and general obstetric precautions including but not limited to vaginal bleeding, contractions, leaking of fluid and fetal movement were reviewed in detail with the patient. Please refer to After Visit Summary for other counseling recommendations.  Return in about 1 week (around  02/19/2015) for OB visit and BP check.   UgOsborne OmanMD

## 2015-02-12 NOTE — Progress Notes (Signed)
Reviewed tip of week with patient  

## 2015-02-12 NOTE — Patient Instructions (Signed)
Return to clinic for any obstetric concerns or go to MAU for evaluation Cesarean Delivery Cesarean delivery is the birth of a baby through a cut (incision) in the abdomen and womb (uterus).  LET [facility] CARE PROVIDER KNOW ABOUT:  All medicines you are taking, including vitamins, herbs, eye drops, creams, and over-the-counter medicines.  Previous problems you or members of your family have had with the use of anesthetics.  Any bleeding or blood clotting disorders you have.  Family history of blood clots or bleeding disorders.  Any history of deep vein thrombosis (DVT) or pulmonary embolism (PE).  Previous surgeries you have had.  Medical conditions you have.  Any allergies you have.  Complicationsinvolving the pregnancy. RISKS AND COMPLICATIONS  Generally, this is a safe procedure. However, as with any procedure, complications can occur. Possible complications include:  Bleeding.  Infection.  Blood clots.  Injury to surrounding organs.  Problems with anesthesia.  Injury to the baby. BEFORE THE PROCEDURE   You may be given an antacid medicine to drink. This will prevent acid contents in your stomach from going into your lungs if you vomit during the surgery.  You may be given an antibiotic medicine to prevent infection. PROCEDURE   To prevent infection of your incision:  Hair may be removed from your pubic area if it is near your incision.  The skin of your pubic area and lower abdomen will be cleaned with a germ-killing solution (antiseptic).  A tube (Foley catheter) will be placed in your bladder to drain your urine from your bladder into a bag. This keeps your bladder empty during surgery.  An IV tube will be placed in your vein.  You may be given medicine to numb the lower half of your body (regional anesthetic). If you were in labor, you may have already had an epidural in place which can be used in both labor and cesarean delivery. You may possibly be  given medicine to make you sleep (general anesthetic) though this is not as common.  Your heart rate and your baby's heart rate will be monitored.  An incision will be made in your abdomen that extends to your uterus. There are 2 basic kinds of incisions:  The horizontal (transverse) incision. Horizontal incisions are from side to side and are used for most routine cesarean deliveries.  The vertical incision. The vertical incision is from the top of the abdomen to the bottom and is less commonly used. It is often done for women who have a serious complication (extreme prematurity) or under emergency situations.  The horizontal and vertical incisions may both be used at the same time. However, this is very uncommon.  An incision is then made in your uterus to deliver the baby.  Your baby will be delivered.  Your health care provider may place the baby on your chest. It is important to keep the baby warm. Your health care provider will dry off the baby, place the baby directly on your bare skin, and cover the baby with warm, dry blankets.  Both incisions will be closed with absorbable stitches. AFTER THE PROCEDURE   If you were awake during the surgery, you will see your baby right away. If you were asleep, you will see your baby as soon as you are awake.  You may breastfeed your baby after surgery.  You may be able to get up and walk the same day as the surgery. If you need to stay in bed for a period of  time, you will receive help to turn, cough, and take deep breaths after surgery. This helps prevent lung problems such as pneumonia.  Do not get out of bed alone the first time after surgery. You will need help getting out of bed until you are able to do this by yourself.  You may be able to shower the day after your cesarean delivery. After the bandage (dressing) is taken off the incision site, a nurse will assist you to shower if you would like help.  You may be directed to take  actions to help prevent blood clots in your legs. These may include:  Walking shortly after surgery, with someone assisting you. Moving around after surgery helps to improve blood flow.  Wearing compression stockings or using different types of devices.  Taking medicines to thin your blood (anticoagulants) if you are at high risk for DVT or PE.  Save any blood clots that you pass from your vagina. If you pass a clot while on the toilet, do not flush it. Call for the nurse. Tell the nurse if you think you are bleeding too much or passing too many clots.  You will be given medicine for pain and nausea as needed. Let your health care providers know if you are hurting. You may also be given an antibiotic to prevent an infection.  Your IV tube will be taken out when you are drinking a reasonable amount of fluids. The Foley catheter is taken out when you are up and walking.  If your blood type is Rh negative and your baby's blood type is Rh positive, you will be given a shot of anti-D immune globulin. This shot prevents you from having Rh problems with a future pregnancy. You should get the shot even if you had your tubes tied (tubal ligation).  If you are allowed to take the baby for a walk, place the baby in the bassinet and push it.   This information is not intended to replace advice given to you by your health care provider. Make sure you discuss any questions you have with your health care provider.   Document Released: 03/14/2005 Document Revised: 12/03/2014 Document Reviewed: 11/09/2011 Elsevier Interactive Patient Education [employer].

## 2015-02-13 ENCOUNTER — Encounter: Payer: Self-pay | Admitting: Obstetrics & Gynecology

## 2015-02-13 DIAGNOSIS — O99820 Streptococcus B carrier state complicating pregnancy: Secondary | ICD-10-CM | POA: Insufficient documentation

## 2015-02-13 LAB — GC/CHLAMYDIA PROBE AMP (~~LOC~~) NOT AT ARMC
Chlamydia: NEGATIVE
Neisseria Gonorrhea: NEGATIVE

## 2015-02-13 LAB — CULTURE, BETA STREP (GROUP B ONLY)

## 2015-02-17 ENCOUNTER — Encounter: Payer: 59 | Admitting: Family Medicine

## 2015-02-17 ENCOUNTER — Encounter: Payer: 59 | Admitting: Advanced Practice Midwife

## 2015-02-17 DIAGNOSIS — O163 Unspecified maternal hypertension, third trimester: Secondary | ICD-10-CM | POA: Insufficient documentation

## 2015-02-18 NOTE — Patient Instructions (Signed)
Your procedure is scheduled on:  Tuesday, Nov. 29, 2016  [facility]:  1:00 PM  Pick up the phone at the desk and dial [phone].  Call this number if you have problems the morning of surgery: [phone].  Remember: Do NOT eat food:  After Midnight Monday Do NOT drink clear liquids after: 10:30 AM Day of surgery Take these medicines the morning of surgery with a SIP OF WATER: None  Do NOT wear jewelry (body piercing), metal hair clips/bobby pins, or nail polish. Do NOT wear lotions, powders, or perfumes.  You may wear deoderant. Do NOT shave for 48 hours prior to surgery. Do NOT bring valuables to the hospital.  Leave suitcase in car.  After surgery it may be brought to your room.  For patients admitted to the hospital, checkout time is 11:00 AM the day of discharge.

## 2015-02-20 ENCOUNTER — Encounter (HOSPITAL_COMMUNITY): Payer: Self-pay | Admitting: Obstetrics & Gynecology

## 2015-02-20 ENCOUNTER — Encounter (HOSPITAL_COMMUNITY)
Admission: AD | Disposition: A | Discharge status: Home / Self Care | Payer: Self-pay | Source: Ambulatory Visit | Attending: Obstetrics & Gynecology

## 2015-02-20 ENCOUNTER — Inpatient Hospital Stay (HOSPITAL_COMMUNITY): Payer: 59 | Admitting: Anesthesiology

## 2015-02-20 ENCOUNTER — Inpatient Hospital Stay (EMERGENCY_DEPARTMENT_HOSPITAL)
Admission: AD | Admit: 2015-02-20 | Discharge: 2015-02-20 | Disposition: A | Discharge status: Home / Self Care | Payer: 59 | Source: Ambulatory Visit | Attending: Obstetrics & Gynecology | Admitting: Obstetrics & Gynecology

## 2015-02-20 ENCOUNTER — Inpatient Hospital Stay (HOSPITAL_COMMUNITY)
Admission: AD | Admit: 2015-02-20 | Discharge: 2015-02-22 | DRG: 766 | Disposition: A | Discharge status: Home / Self Care | Payer: 59 | Source: Ambulatory Visit | Attending: Obstetrics & Gynecology | Admitting: Obstetrics & Gynecology

## 2015-02-20 ENCOUNTER — Encounter (HOSPITAL_COMMUNITY): Payer: Self-pay | Admitting: Anesthesiology

## 2015-02-20 ENCOUNTER — Encounter (HOSPITAL_COMMUNITY): Payer: Self-pay | Admitting: *Deleted

## 2015-02-20 DIAGNOSIS — O133 Gestational [pregnancy-induced] hypertension without significant proteinuria, third trimester: Secondary | ICD-10-CM

## 2015-02-20 DIAGNOSIS — O34211 Maternal care for low transverse scar from previous cesarean delivery: Secondary | ICD-10-CM | POA: Diagnosis not present

## 2015-02-20 DIAGNOSIS — O99824 Streptococcus B carrier state complicating childbirth: Secondary | ICD-10-CM | POA: Diagnosis present

## 2015-02-20 DIAGNOSIS — O9081 Anemia of the puerperium: Secondary | ICD-10-CM | POA: Diagnosis not present

## 2015-02-20 DIAGNOSIS — Z302 Encounter for sterilization: Secondary | ICD-10-CM

## 2015-02-20 DIAGNOSIS — D649 Anemia, unspecified: Secondary | ICD-10-CM | POA: Diagnosis not present

## 2015-02-20 DIAGNOSIS — Z3A38 38 weeks gestation of pregnancy: Secondary | ICD-10-CM

## 2015-02-20 DIAGNOSIS — O26893 Other specified pregnancy related conditions, third trimester: Secondary | ICD-10-CM | POA: Diagnosis present

## 2015-02-20 DIAGNOSIS — IMO0001 Reserved for inherently not codable concepts without codable children: Secondary | ICD-10-CM | POA: Insufficient documentation

## 2015-02-20 DIAGNOSIS — O163 Unspecified maternal hypertension, third trimester: Secondary | ICD-10-CM

## 2015-02-20 DIAGNOSIS — O99820 Streptococcus B carrier state complicating pregnancy: Secondary | ICD-10-CM

## 2015-02-20 LAB — RAPID URINE DRUG SCREEN, HOSP PERFORMED
Amphetamines: NOT DETECTED
Barbiturates: NOT DETECTED
Benzodiazepines: NOT DETECTED
Cocaine: NOT DETECTED
Opiates: NOT DETECTED
Tetrahydrocannabinol: POSITIVE — AB

## 2015-02-20 LAB — CBC
HCT: 31.1 % — ABNORMAL LOW (ref 36.0–46.0)
Hemoglobin: 10.3 g/dL — ABNORMAL LOW (ref 12.0–15.0)
MCH: 28.8 pg (ref 26.0–34.0)
MCHC: 33.1 g/dL (ref 30.0–36.0)
MCV: 86.9 fL (ref 78.0–100.0)
Platelets: 281 10*3/uL (ref 150–400)
RBC: 3.58 MIL/uL — ABNORMAL LOW (ref 3.87–5.11)
RDW: 14 % (ref 11.5–15.5)
WBC: 12.4 10*3/uL — ABNORMAL HIGH (ref 4.0–10.5)

## 2015-02-20 LAB — RPR: RPR Ser Ql: NONREACTIVE

## 2015-02-20 LAB — TYPE AND SCREEN
ABO/RH(D): A POS
Antibody Screen: NEGATIVE

## 2015-02-20 SURGERY — Surgical Case
Anesthesia: Spinal

## 2015-02-20 MED ORDER — FENTANYL CITRATE (PF) 100 MCG/2ML IJ SOLN
INTRAMUSCULAR | Status: DC | PRN
  Administered 2015-02-20: 10 ug via INTRATHECAL

## 2015-02-20 MED ORDER — MEPERIDINE HCL 25 MG/ML IJ SOLN: 6.2500 mg | INTRAMUSCULAR | Status: DC | PRN

## 2015-02-20 MED ORDER — BUPIVACAINE IN DEXTROSE 0.75-8.25 % IT SOLN
INTRATHECAL | Status: DC | PRN
  Administered 2015-02-20: 12 mg via INTRATHECAL

## 2015-02-20 MED ORDER — MORPHINE SULFATE (PF) 0.5 MG/ML IJ SOLN
INTRAMUSCULAR | Status: DC | PRN
  Administered 2015-02-20: .2 mg via INTRATHECAL

## 2015-02-20 MED ORDER — IBUPROFEN 600 MG PO TABS
600.0000 mg | ORAL_TABLET | Freq: Four times a day (QID) | ORAL | Status: DC
  Administered 2015-02-20 – 2015-02-22 (×6): 600 mg via ORAL
  Filled 2015-02-20 (×6): qty 1

## 2015-02-20 MED ORDER — NALBUPHINE HCL 10 MG/ML IJ SOLN: 5.0000 mg | Freq: Once | INTRAMUSCULAR | Status: DC | PRN

## 2015-02-20 MED ORDER — ONDANSETRON HCL 4 MG/2ML IJ SOLN
INTRAMUSCULAR | Status: DC | PRN
  Administered 2015-02-20: 4 mg via INTRAVENOUS

## 2015-02-20 MED ORDER — OXYTOCIN 40 UNITS IN LACTATED RINGERS INFUSION - SIMPLE MED: 62.5000 mL/h | INTRAVENOUS | Status: AC

## 2015-02-20 MED ORDER — OXYCODONE-ACETAMINOPHEN 5-325 MG PO TABS
1.0000 | ORAL_TABLET | ORAL | Status: DC | PRN
  Administered 2015-02-21: 1 via ORAL

## 2015-02-20 MED ORDER — PRENATAL MULTIVITAMIN CH
1.0000 | ORAL_TABLET | Freq: Every day | ORAL | Status: DC
  Administered 2015-02-21: 1 via ORAL
  Filled 2015-02-20 (×2): qty 1

## 2015-02-20 MED ORDER — PHENYLEPHRINE 8 MG IN D5W 100 ML (0.08MG/ML) PREMIX OPTIME
INJECTION | INTRAVENOUS | Status: AC
  Filled 2015-02-20: qty 100

## 2015-02-20 MED ORDER — BUPIVACAINE HCL (PF) 0.25 % IJ SOLN
INTRAMUSCULAR | Status: AC
  Filled 2015-02-20: qty 30

## 2015-02-20 MED ORDER — TETANUS-DIPHTH-ACELL PERTUSSIS 5-2.5-18.5 LF-MCG/0.5 IM SUSP: 0.5000 mL | Freq: Once | INTRAMUSCULAR | Status: DC

## 2015-02-20 MED ORDER — NALOXONE HCL 0.4 MG/ML IJ SOLN: 0.4000 mg | INTRAMUSCULAR | Status: DC | PRN

## 2015-02-20 MED ORDER — LACTATED RINGERS IV SOLN: INTRAVENOUS | Status: DC

## 2015-02-20 MED ORDER — TERBUTALINE SULFATE 1 MG/ML IJ SOLN
0.2500 mg | Freq: Once | INTRAMUSCULAR | Status: AC
  Administered 2015-02-20: 0.25 mg via SUBCUTANEOUS

## 2015-02-20 MED ORDER — MORPHINE SULFATE (PF) 0.5 MG/ML IJ SOLN
INTRAMUSCULAR | Status: AC
  Filled 2015-02-20: qty 10

## 2015-02-20 MED ORDER — OXYTOCIN 10 UNIT/ML IJ SOLN
40.0000 [IU] | INTRAVENOUS | Status: DC | PRN
  Administered 2015-02-20: 40 [IU] via INTRAVENOUS

## 2015-02-20 MED ORDER — OXYTOCIN 10 UNIT/ML IJ SOLN
INTRAMUSCULAR | Status: AC
  Filled 2015-02-20: qty 4

## 2015-02-20 MED ORDER — KETOROLAC TROMETHAMINE 30 MG/ML IJ SOLN: 30.0000 mg | Freq: Four times a day (QID) | INTRAMUSCULAR | Status: AC | PRN

## 2015-02-20 MED ORDER — ONDANSETRON HCL 4 MG/2ML IJ SOLN
INTRAMUSCULAR | Status: AC
  Filled 2015-02-20: qty 2

## 2015-02-20 MED ORDER — SODIUM CHLORIDE 0.9 % IJ SOLN: 3.0000 mL | INTRAMUSCULAR | Status: DC | PRN

## 2015-02-20 MED ORDER — DEXAMETHASONE SODIUM PHOSPHATE 10 MG/ML IJ SOLN
INTRAMUSCULAR | Status: AC
  Filled 2015-02-20: qty 1

## 2015-02-20 MED ORDER — MEPERIDINE HCL 25 MG/ML IJ SOLN
INTRAMUSCULAR | Status: DC | PRN
  Administered 2015-02-20 (×2): 12.5 mg via INTRAVENOUS

## 2015-02-20 MED ORDER — NALBUPHINE HCL 10 MG/ML IJ SOLN: 5.0000 mg | INTRAMUSCULAR | Status: DC | PRN

## 2015-02-20 MED ORDER — BUPIVACAINE HCL (PF) 0.25 % IJ SOLN
INTRAMUSCULAR | Status: DC | PRN
  Administered 2015-02-20: 10 mL

## 2015-02-20 MED ORDER — ONDANSETRON HCL 4 MG/2ML IJ SOLN
4.0000 mg | Freq: Three times a day (TID) | INTRAMUSCULAR | Status: DC | PRN
  Administered 2015-02-20: 4 mg via INTRAVENOUS
  Filled 2015-02-20: qty 2

## 2015-02-20 MED ORDER — ZOLPIDEM TARTRATE 5 MG PO TABS: 5.0000 mg | ORAL_TABLET | Freq: Every evening | ORAL | Status: DC | PRN

## 2015-02-20 MED ORDER — PROMETHAZINE HCL 25 MG/ML IJ SOLN: 6.2500 mg | INTRAMUSCULAR | Status: DC | PRN

## 2015-02-20 MED ORDER — SENNOSIDES-DOCUSATE SODIUM 8.6-50 MG PO TABS
2.0000 | ORAL_TABLET | ORAL | Status: DC
  Administered 2015-02-21 – 2015-02-22 (×2): 2 via ORAL
  Filled 2015-02-20 (×2): qty 2

## 2015-02-20 MED ORDER — OXYCODONE-ACETAMINOPHEN 5-325 MG PO TABS
2.0000 | ORAL_TABLET | ORAL | Status: DC | PRN
  Administered 2015-02-20 – 2015-02-22 (×4): 2 via ORAL
  Filled 2015-02-20 (×5): qty 2

## 2015-02-20 MED ORDER — SCOPOLAMINE 1 MG/3DAYS TD PT72
MEDICATED_PATCH | TRANSDERMAL | Status: DC | PRN
  Administered 2015-02-20: 1 via TRANSDERMAL

## 2015-02-20 MED ORDER — MENTHOL 3 MG MT LOZG: 1.0000 | LOZENGE | OROMUCOSAL | Status: DC | PRN

## 2015-02-20 MED ORDER — LANOLIN HYDROUS EX OINT: 1.0000 "application " | TOPICAL_OINTMENT | CUTANEOUS | Status: DC | PRN

## 2015-02-20 MED ORDER — DEXAMETHASONE SODIUM PHOSPHATE 10 MG/ML IJ SOLN
INTRAMUSCULAR | Status: DC | PRN
  Administered 2015-02-20: 10 mg via INTRAVENOUS

## 2015-02-20 MED ORDER — ACETAMINOPHEN 325 MG PO TABS: 650.0000 mg | ORAL_TABLET | ORAL | Status: DC | PRN

## 2015-02-20 MED ORDER — LACTATED RINGERS IV SOLN
INTRAVENOUS | Status: DC
  Administered 2015-02-20: 05:00:00 via INTRAVENOUS
  Administered 2015-02-20: 125 mL/h via INTRAVENOUS
  Administered 2015-02-20 – 2015-02-21 (×2): via INTRAVENOUS

## 2015-02-20 MED ORDER — DIPHENHYDRAMINE HCL 25 MG PO CAPS: 25.0000 mg | ORAL_CAPSULE | Freq: Four times a day (QID) | ORAL | Status: DC | PRN

## 2015-02-20 MED ORDER — MEPERIDINE HCL 25 MG/ML IJ SOLN
INTRAMUSCULAR | Status: AC
  Filled 2015-02-20: qty 1

## 2015-02-20 MED ORDER — NALOXONE HCL 2 MG/2ML IJ SOSY
1.0000 ug/kg/h | PREFILLED_SYRINGE | INTRAMUSCULAR | Status: DC | PRN
  Filled 2015-02-20: qty 2

## 2015-02-20 MED ORDER — DIBUCAINE 1 % RE OINT: 1.0000 "application " | TOPICAL_OINTMENT | RECTAL | Status: DC | PRN

## 2015-02-20 MED ORDER — KETOROLAC TROMETHAMINE 30 MG/ML IJ SOLN
INTRAMUSCULAR | Status: AC
  Administered 2015-02-20: 30 mg
  Filled 2015-02-20: qty 1

## 2015-02-20 MED ORDER — DIPHENHYDRAMINE HCL 50 MG/ML IJ SOLN
12.5000 mg | INTRAMUSCULAR | Status: DC | PRN
  Administered 2015-02-20: 12.5 mg via INTRAVENOUS
  Filled 2015-02-20: qty 1

## 2015-02-20 MED ORDER — SIMETHICONE 80 MG PO CHEW
80.0000 mg | CHEWABLE_TABLET | Freq: Three times a day (TID) | ORAL | Status: DC
  Administered 2015-02-20 – 2015-02-22 (×7): 80 mg via ORAL
  Filled 2015-02-20 (×6): qty 1

## 2015-02-20 MED ORDER — DEXTROSE 5 % IV SOLN
2.0000 g | INTRAVENOUS | Status: AC
  Administered 2015-02-20: 2 g via INTRAVENOUS
  Filled 2015-02-20: qty 2

## 2015-02-20 MED ORDER — TERBUTALINE SULFATE 1 MG/ML IJ SOLN
INTRAMUSCULAR | Status: AC
  Administered 2015-02-20: 0.25 mg via SUBCUTANEOUS
  Filled 2015-02-20: qty 1

## 2015-02-20 MED ORDER — SCOPOLAMINE 1 MG/3DAYS TD PT72
MEDICATED_PATCH | TRANSDERMAL | Status: AC
  Filled 2015-02-20: qty 1

## 2015-02-20 MED ORDER — SCOPOLAMINE 1 MG/3DAYS TD PT72
1.0000 | MEDICATED_PATCH | Freq: Once | TRANSDERMAL | Status: DC
  Filled 2015-02-20: qty 1

## 2015-02-20 MED ORDER — DIPHENHYDRAMINE HCL 25 MG PO CAPS: 25.0000 mg | ORAL_CAPSULE | ORAL | Status: DC | PRN

## 2015-02-20 MED ORDER — SIMETHICONE 80 MG PO CHEW
80.0000 mg | CHEWABLE_TABLET | ORAL | Status: DC
  Administered 2015-02-22: 80 mg via ORAL
  Filled 2015-02-20 (×3): qty 1

## 2015-02-20 MED ORDER — SIMETHICONE 80 MG PO CHEW: 80.0000 mg | CHEWABLE_TABLET | ORAL | Status: DC | PRN

## 2015-02-20 MED ORDER — FENTANYL CITRATE (PF) 100 MCG/2ML IJ SOLN: 25.0000 ug | INTRAMUSCULAR | Status: DC | PRN

## 2015-02-20 MED ORDER — FENTANYL CITRATE (PF) 100 MCG/2ML IJ SOLN
INTRAMUSCULAR | Status: AC
  Filled 2015-02-20: qty 2

## 2015-02-20 MED ORDER — CITRIC ACID-SODIUM CITRATE 334-500 MG/5ML PO SOLN
30.0000 mL | ORAL | Status: AC
  Administered 2015-02-20: 30 mL via ORAL
  Filled 2015-02-20: qty 15

## 2015-02-20 MED ORDER — WITCH HAZEL-GLYCERIN EX PADS
1.0000 | MEDICATED_PAD | CUTANEOUS | Status: DC | PRN
Start: 2015-02-20 — End: 2015-02-22

## 2015-02-20 SURGICAL SUPPLY — 32 items
BARRIER ADHS 3X4 INTERCEED (GAUZE/BANDAGES/DRESSINGS) IMPLANT
BRR ADH 4X3 ABS CNTRL BYND (GAUZE/BANDAGES/DRESSINGS)
CLAMP CORD UMBIL (MISCELLANEOUS) IMPLANT
CLIP FILSHIE TUBAL LIGA STRL (Clip) ×3 IMPLANT
CLOTH BEACON ORANGE TIMEOUT ST (SAFETY) ×3 IMPLANT
DRAPE SHEET LG 3/4 BI-LAMINATE (DRAPES) IMPLANT
DRSG OPSITE POSTOP 4X10 (GAUZE/BANDAGES/DRESSINGS) ×3 IMPLANT
DURAPREP 26ML APPLICATOR (WOUND CARE) ×3 IMPLANT
ELECT REM PT RETURN 9FT ADLT (ELECTROSURGICAL) ×3
ELECTRODE REM PT RTRN 9FT ADLT (ELECTROSURGICAL) ×2 IMPLANT
EXTRACTOR VACUUM KIWI (MISCELLANEOUS) IMPLANT
GLOVE BIO SURGEON STRL SZ 6.5 (GLOVE) ×3 IMPLANT
GLOVE BIOGEL PI IND STRL 7.0 (GLOVE) ×4 IMPLANT
GLOVE BIOGEL PI INDICATOR 7.0 (GLOVE) ×2
GOWN STRL REUS W/TWL LRG LVL3 (GOWN DISPOSABLE) ×6 IMPLANT
KIT ABG SYR 3ML LUER SLIP (SYRINGE) IMPLANT
NEEDLE HYPO 22GX1.5 SAFETY (NEEDLE) IMPLANT
NEEDLE HYPO 25X5/8 SAFETYGLIDE (NEEDLE) IMPLANT
NS IRRIG 1000ML POUR BTL (IV SOLUTION) ×3 IMPLANT
PACK C SECTION WH (CUSTOM PROCEDURE TRAY) ×3 IMPLANT
PAD OB MATERNITY 4.3X12.25 (PERSONAL CARE ITEMS) ×3 IMPLANT
PENCIL SMOKE EVAC W/HOLSTER (ELECTROSURGICAL) ×3 IMPLANT
RETRACTOR WND ALEXIS 25 LRG (MISCELLANEOUS) IMPLANT
RTRCTR WOUND ALEXIS 25CM LRG (MISCELLANEOUS)
SUT PLAIN 2 0 XLH (SUTURE) ×3 IMPLANT
SUT VIC AB 0 CT1 36 (SUTURE) ×18 IMPLANT
SUT VIC AB 2-0 CT1 27 (SUTURE) ×1
SUT VIC AB 2-0 CT1 TAPERPNT 27 (SUTURE) ×2 IMPLANT
SUT VIC AB 4-0 PS2 27 (SUTURE) ×3 IMPLANT
SYR CONTROL 10ML LL (SYRINGE) IMPLANT
TOWEL OR 17X24 6PK STRL BLUE (TOWEL DISPOSABLE) ×3 IMPLANT
TRAY FOLEY CATH SILVER 14FR (SET/KITS/TRAYS/PACK) IMPLANT

## 2015-02-20 NOTE — Consult Note (Signed)
[facility]  Delivery Note:  C-section       02/20/2015  5:01 AM  I was called to the operating room at the request of the patient's obstetrician (Dr. Roselie Awkward) for a repeat c-section.  PRENATAL HX:  24 y/o G3P1011 at 90 and 3/7 weeks who presented to MAU in labor, not ruptured.  Pregnancy complicated by GBS + (no treatment indicated), mariajuana use, and late prenatal care.    INTRAPARTUM HX:   Repeat c-section with AROM at delivery  DELIVERY:  Infant was vigorous at delivery, requiring no resuscitation other than standard warming, drying and stimulation.  APGARs 8 and 9.  Exam within normal limits.  After 5 minutes, baby left with nurse to assist parents with skin-to-skin care.   _____________________ Electronically Signed By: Clinton Gallant, MD Neonatologist

## 2015-02-20 NOTE — MAU Note (Signed)
Pt c/o contractions since 11pm every 5 mins. Denies LOF or vag bleeding. +FM. Scheduled for repeat c/s on 11/29.

## 2015-02-20 NOTE — Anesthesia Procedure Notes (Signed)
Spinal Patient location during procedure: OR Start time: 02/20/2015 4:42 AM End time: 02/20/2015 4:45 AM Staffing Anesthesiologist: Suella Broad D Performed by: anesthesiologist  Preanesthetic Checklist Completed: patient identified, site marked, surgical consent, pre-op evaluation, timeout performed, IV checked, risks and benefits discussed and monitors and equipment checked Spinal Block Patient position: sitting Prep: DuraPrep Patient monitoring: heart rate, continuous pulse ox, blood pressure and cardiac monitor Approach: midline Location: L4-5 Injection technique: single-shot Needle Needle type: Whitacre and Introducer  Needle gauge: 24 G Needle length: 9 cm Additional Notes Negative paresthesia. Negative blood return. Positive free-flowing CSF. Expiration date of kit checked and confirmed. Patient tolerated procedure well, without complications.

## 2015-02-20 NOTE — MAU Provider Note (Signed)
None     Chief Complaint:  Contractions   Amber Blanchard is  24 y.o. G3P1011 at 6430w3d presents complaining of Contractions .  She states regular, every 4 minutes contractions are associated with none vaginal bleeding, intact membranes, along with active fetal movement. Has had normal BP at office except for 145/91 at 29 weeks and 145/75 at 35 weeks.   Obstetrical/Gynecological History: OB History    Gravida Para Term Preterm AB TAB SAB Ectopic Multiple Living   3 1 1  0 1 0 1 0 0 1     Past Medical History: Past Medical History  Diagnosis Date  . As child and both parents have hypertension but none now   . HPV in female   . Perineal abscess   . External hemorrhoids   . Anemia   . Cholelithiasis     Past Surgical History: Past Surgical History  Procedure Laterality Date  . Cesarean section  10/21/2010    Procedure: CESAREAN SECTION;  Surgeon: Jessee Aversara J. Cole;  Location: [facility];  Service: Gynecology;  Laterality: N/A;    Family History: History reviewed. No pertinent family history.  Social History: Social History  Substance Use Topics  . Smoking status: Never Smoker   . Smokeless tobacco: None  . Alcohol Use: No    Allergies: No Known Allergies  Meds:  Prescriptions prior to admission  Medication Sig Dispense Refill Last Dose  . Prenatal Multivit-Min-Fe-FA (PRENATAL VITAMINS) 0.8 MG tablet Take 1 tablet by mouth daily. 30 tablet 12 02/19/2015 at Unknown time  . promethazine (PHENERGAN) 12.5 MG tablet Take 1 tablet (12.5 mg total) by mouth every 6 (six) hours as needed for nausea or vomiting. 30 tablet 0 Taking    Review of Systems   Constitutional: Negative for fever and chills Eyes: Negative for visual disturbances Respiratory: Negative for shortness of breath, dyspnea Cardiovascular: Negative for chest pain or palpitations  Gastrointestinal: Negative for vomiting, diarrhea and constipation Genitourinary: Negative for dysuria and urgency Musculoskeletal:  Negative for back pain, joint pain, myalgias.  Normal ROM  Neurological: Negative for dizziness and headaches    Physical Exam  Blood pressure 147/84, pulse 97, temperature 98.4 F (36.9 C), temperature source Oral, resp. rate 18, height 5\' 3"  (1.6 m), weight 102.422 kg (225 lb 12.8 oz), last menstrual period 05/27/2014, SpO2 99 %. GENERAL: Well-developed, well-nourished female in no acute distress.  LUNGS: Clear to auscultation bilaterally.  HEART: Regular rate and rhythm. ABDOMEN: Soft, nontender, nondistended, gravid.  EXTREMITIES: Nontender, no edema, 2+ distal pulses. DTR's 2+ CERVICAL EXAM: Dilatation 3cm   Effacement 70%   Station ballottable   Presentation: cephalic FHT:  Baseline rate 145 bpm   Variability moderate  Accelerations present   Decelerations none Contractions: Every 0 mins. Has not had any contractions since arriving to MAU   Labs: No results found for this or any previous visit (from the past 24 hour(s)). Imaging Studies:  No results found.  Assessment: Amber Blanchard is  24 y.o. G3P1011 at 3730w3d presents with false labor.  Elevated BP  Plan: DC home.  FU Saturday  For BP check in MAU (clinic closed tomorrow d/t holiday)  CRESENZO-DISHMAN,Breanda Greenlaw 11/25/20161:33 AM

## 2015-02-20 NOTE — Anesthesia Preprocedure Evaluation (Signed)
Anesthesia Evaluation  Patient identified by MRN, date of birth, ID band Patient awake    Reviewed: Allergy & Precautions, NPO status , Patient's Chart, lab work & pertinent test results  Airway Mallampati: III   Neck ROM: Full    Dental  (+) Teeth Intact   Pulmonary neg pulmonary ROS,    breath sounds clear to auscultation       Cardiovascular hypertension,  Rhythm:Regular Rate:Normal     Neuro/Psych negative neurological ROS  negative psych ROS   GI/Hepatic negative GI ROS, Neg liver ROS,   Endo/Other  negative endocrine ROS  Renal/GU negative Renal ROS  negative genitourinary   Musculoskeletal negative musculoskeletal ROS (+)   Abdominal   Peds negative pediatric ROS (+)  Hematology  (+) anemia ,   Anesthesia Other Findings   Reproductive/Obstetrics (+) Pregnancy                             Lab Results  Component Value Date   WBC 12.4* 02/20/2015   HGB 10.3* 02/20/2015   HCT 31.1* 02/20/2015   MCV 86.9 02/20/2015   PLT 281 02/20/2015   No results found for: INR, PROTIME   Anesthesia Physical Anesthesia Plan  ASA: III and emergent  Anesthesia Plan: Spinal   Post-op Pain Management:    Induction:   Airway Management Planned: Natural Airway  Additional Equipment:   Intra-op Plan:   Post-operative Plan:   Informed Consent: I have reviewed the patients History and Physical, chart, labs and discussed the procedure including the risks, benefits and alternatives for the proposed anesthesia with the patient or authorized representative who has indicated his/her understanding and acceptance.   Dental advisory given  Plan Discussed with: CRNA  Anesthesia Plan Comments:         Anesthesia Quick Evaluation

## 2015-02-20 NOTE — MAU Note (Signed)
Contractions much worse

## 2015-02-20 NOTE — Discharge Instructions (Signed)

## 2015-02-20 NOTE — H&P (Signed)
None     Chief Complaint:  No chief complaint on file.   Romilda JoyJasmine N Mayden is  24 y.o. G3P1011 at 2867w3d presents complaining of contractions. Was here in the MAU earlier tonight, and was found to be in false labor. She went home, and has now returned with increased in cervical diameter. She strongly desires a cesarean delivery. She has no nausea, or vomiting. She has not had any LOF, VB since she left. No abdominal pain outside of contractions. She says she is contracting every "few minutes". She has +FM. Boy (circ) / Bottle / Desires BTL (consent signed 11/1). [facility] - GBS pos.  - THC pos.  [facility]ound confirmed dates.  - Genital warts during preg.    Obstetrical/Gynecological History: OB History    Gravida Para Term Preterm AB TAB SAB Ectopic Multiple Living   3 1 1  0 1 0 1 0 0 1     Past Medical History: Past Medical History  Diagnosis Date  . As child and both parents have hypertension but none now   . HPV in female   . Perineal abscess   . External hemorrhoids   . Anemia   . Cholelithiasis     Past Surgical History: Past Surgical History  Procedure Laterality Date  . Cesarean section  10/21/2010    Procedure: CESAREAN SECTION;  Surgeon: Jessee Aversara J. Cole;  Location: [facility];  Service: Gynecology;  Laterality: N/A;    Family History: No family history on file.  Social History: Social History  Substance Use Topics  . Smoking status: Never Smoker   . Smokeless tobacco: Not on file  . Alcohol Use: No    Allergies: No Known Allergies  Meds:  Prescriptions prior to admission  Medication Sig Dispense Refill Last Dose  . Prenatal Multivit-Min-Fe-FA (PRENATAL VITAMINS) 0.8 MG tablet Take 1 tablet by mouth daily. 30 tablet 12 02/19/2015 at Unknown time  . promethazine (PHENERGAN) 12.5 MG tablet Take 1 tablet (12.5 mg total) by mouth every 6 (six) hours as needed for nausea or vomiting. 30 tablet 0 Taking    Review of Systems -   Review of Systems   Constitutional: Negative for fever, chills, weight loss, malaise/fatigue and diaphoresis.  HENT: Negative for hearing loss, ear pain, nosebleeds, congestion, sore throat, neck pain, tinnitus and ear discharge.   Eyes: Negative for blurred vision, double vision, photophobia, pain, discharge and redness.  Respiratory: Negative for cough, hemoptysis, sputum production, shortness of breath, wheezing and stridor.   Cardiovascular: Negative for chest pain, palpitations, orthopnea,  leg swelling  Gastrointestinal: Negative for abdominal pain heartburn, nausea, vomiting, diarrhea, constipation, blood in stool Genitourinary: Negative for dysuria, urgency, frequency, hematuria and flank pain.  Musculoskeletal: Negative for myalgias, back pain, joint pain and falls.  Skin: Negative for itching and rash.  Neurological: Negative for dizziness, tingling, tremors, sensory change, speech change, focal weakness, seizures, loss of consciousness, weakness and headaches.  Endo/Heme/Allergies: Negative for environmental allergies and polydipsia. Does not bruise/bleed easily.  Psychiatric/Behavioral: Negative for depression, suicidal ideas, hallucinations, memory loss and substance abuse. The patient is not nervous/anxious and does not have insomnia.      Physical Exam  Last menstrual period 05/27/2014. GENERAL: Well-developed, well-nourished female in no acute distress.  LUNGS: Clear to auscultation bilaterally.  HEART: Regular rate and rhythm. ABDOMEN: Soft, nontender, nondistended, gravid.  EXTREMITIES: Nontender, no edema, 2+ distal pulses. DTR's 2+ CERVICAL EXAM: Dilatation 7cm   Effacement 70%   Station -2  Presentation: cephalic FHT:  Baseline rate 135 bpm   Variability moderate  Accelerations present   Decelerations none Contractions: Every 3-4 mins   Labs: No results found for this or any previous visit (from the past 24 hour(s)). Imaging Studies:  No results found.  Assessment: KARENE BRACKEN is  24 y.o. G3P1011 at [redacted]w[redacted]d presents with active labor. Hx of previous LTCS 2/2 Arrest of labor desiring repeat with BTL. Refused TOLAC.  Plan: - Discussed with Dr. Debroah Loop.  - Admit for cesarean delivery.  - pre-op workup / labs.  - Fentanyl for pain now.  - Terbutaline  - Needs SW consult for +THC in the past. / UDS.   Bevin Das 11/25/20164:08 AM

## 2015-02-20 NOTE — Transfer of Care (Signed)
Immediate Anesthesia Transfer of Care Note  Patient: Amber Blanchard  Procedure(s) Performed: Procedure(s): CESAREAN SECTION WITH BILATERAL TUBAL LIGATION (Bilateral)  Patient Location: PACU  Anesthesia Type:Spinal  Level of Consciousness: awake, alert  and oriented  Airway & Oxygen Therapy: Patient Spontanous Breathing  Post-op Assessment: Report given to RN and Post -op Vital signs reviewed and stable  Post vital signs: Reviewed and stable  Last Vitals: There were no vitals filed for this visit.  Complications: No apparent anesthesia complications

## 2015-02-20 NOTE — Progress Notes (Signed)
Pt alert and oriented, VS stable,skin warm and dry; Pt does not appear in any resp distress.  Ready for transfer to [facility] 146.

## 2015-02-20 NOTE — Op Note (Signed)
Cesarean Section Procedure Note  Bilateral tubal ligation Amber Blanchard  02/20/2015  Indications: Elective repeat and desires sterilization  Pre-operative Diagnosis: prior cesarean section; labor. Undesired fertility  Post-operative Diagnosis: Same   Surgeon: Surgeon(s) and Role:    * Woodroe Mode, MD - Primary   Assistants: none  Anesthesia: spinal   Procedure Details:  The patient was seen in the Holding Room. The risks, benefits, complications, treatment options, and expected outcomes were discussed with the patient. The patient concurred with the proposed plan, giving informed consent. identified as [name] and the procedure verified as C-Section Delivery. A Time Out was held and the above information confirmed.  After induction of anesthesia, the patient was draped and prepped in the usual sterile manner. A transverse was made and carried down through the subcutaneous tissue to the fascia. Fascial incision was made and extended transversely. The fascia was separated from the underlying rectus tissue superiorly and inferiorly. The peritoneum was identified and entered. Peritoneal incision was extended longitudinally. The utero-vesical peritoneal reflection was incised transversely and the bladder flap was bluntly freed from the lower uterine segment. A low transverse uterine incision was made. Delivered from cephalic presentation was a  Living newborn infant(s) or Female with Apgar scores of 8 at one minute and 9 at five minutes. Cord ph was not sent the umbilical cord was clamped and cut cord blood was obtained for evaluation. The placenta was removed Intact and appeared normal. The uterine outline, tubes and ovaries appeared normal}. The uterine incision was closed with running locked sutures of 0Vicryl. Imbricating layer followed.  Hemostasis was observed.Fallopian tubes were identified and Filshie clips were applied 3 cm from the cornua. Peritoneum was closed. The fascia was then  reapproximated with running sutures of 0Vicryl. The subcutaneous closure was performed using 2-0plain gut. The skin was closed with 4-0Vicryl.   Instrument, sponge, and needle counts were correct prior the abdominal closure and were correct at the conclusion of the case.    Findings:   Estimated Blood Loss: * No blood loss amount entered *   Total IV Fluids: 1900 ml   Urine Output: 100CC OF clear urine  Specimens:   Complications: no complications  Disposition: PACU - hemodynamically stable.   Maternal Condition: stable   Baby condition / location:  Couplet care / Skin to Skin  Attending Attestation: I was present and scrubbed for the entire procedure.   Signed: Surgeon(s): Woodroe Mode, MD  02/20/2015 5:48 AM

## 2015-02-20 NOTE — MAU Note (Signed)
Pt c/o ctx x 2 hours. Denies LOF and VB. +FM. Repeat CS scheduled next week.

## 2015-02-20 NOTE — Anesthesia Postprocedure Evaluation (Signed)
Anesthesia Post Note  Patient: Amber Blanchard  Procedure(s) Performed: Procedure(s) (LRB): CESAREAN SECTION WITH BILATERAL TUBAL LIGATION (Bilateral)  Patient location during evaluation: PACU Anesthesia Type: Spinal Level of consciousness: oriented and awake and alert Pain management: pain level controlled Vital Signs Assessment: post-procedure vital signs reviewed and stable Respiratory status: spontaneous breathing and respiratory function stable Cardiovascular status: blood pressure returned to baseline and stable Postop Assessment: No backache, Spinal receding, No headache, Patient able to bend at knees and No signs of nausea or vomiting Anesthetic complications: no    Last Vitals:  Filed Vitals:   02/20/15 0645 02/20/15 0700  BP: 121/65   Pulse: 73   Temp:  36.6 C  Resp: 16     Last Pain:  Filed Vitals:   02/20/15 0708  PainSc: 6                  Valrie Jia A.

## 2015-02-20 NOTE — Lactation Note (Signed)
This note was copied from the chart of Boy Ulla PotashJasmine Ramberg. Lactation Consultation Note  Patient Name: Boy Ulla PotashJasmine Kapral AVWUJ'WToday's Date: 02/20/2015 Reason for consult: Initial assessment   Initial consultation with mom and 10 hour old infant [place]. Mom did not BF first child. Infant born at 11038w 3d gestation via C/S weighing 6lb 7 oz. Infant with BF X1 for 40 min, attempts x 5, 2 voids and 3 stools since birth. Infant with LS 7 by RN. Reviewed BF basics, STS, feeding 8-12 x in 24 hours, stomach size, nutritional needs of NB, NL NB feeding behavior, awakening techniques, feeding cues. Taught mom hand expression, few gtts of colostrum noted in each breast. Mom with wide spaced cone shaped breasts with everted nipples. Undressed infant and attempted to get him to feed, he would not latch, encouraged mom to feed at first feeding cues and to attempt every 2 hours if infant not cueing to feed. Advised mom and dad to place infant STS with feeding. Mom with HX of Marijuana use with pregnancy, did not discuss as family had visitor in room. [facility] given to mom, advised of phone #, OP services, BF Resources, and Support Groups. Enc family to maintain feeding log. [facility], [facility]. Mom voiced understanding ato all teaching, all questions answered. Enc mom to call with questions/concerns/assistance prn.    Maternal Data Formula Feeding for Exclusion: No Has patient been taught Hand Expression?: Yes Does the patient have breastfeeding experience prior to this delivery?: No  Feeding Feeding Type: Breast Fed Length of feed: 0 min  LATCH Score/Interventions Latch: Too sleepy or reluctant, no latch achieved, no sucking elicited. Intervention(s): Skin to skin;Teach feeding cues;Waking techniques Intervention(s): Adjust position;Assist with latch;Breast massage;Breast compression  Audible Swallowing: None Intervention(s): Skin to skin Intervention(s): Hand  expression;Skin to skin  Type of Nipple: Everted at rest and after stimulation  Comfort (Breast/Nipple): Soft / non-tender     Hold (Positioning): Assistance needed to correctly position infant at breast and maintain latch. Intervention(s): Breastfeeding basics reviewed;Support Pillows;Position options;Skin to skin  LATCH Score: 5  Lactation Tools Discussed/Used WIC Program: Yes   Consult Status Consult Status: Follow-up Date: 02/21/15 Follow-up type: In-patient    Silas FloodSharon S Eugenie Harewood 02/20/2015, 3:33 PM

## 2015-02-21 LAB — CBC
HCT: 25.3 % — ABNORMAL LOW (ref 36.0–46.0)
Hemoglobin: 8.2 g/dL — ABNORMAL LOW (ref 12.0–15.0)
MCH: 28 pg (ref 26.0–34.0)
MCHC: 32 g/dL (ref 30.0–36.0)
MCV: 87.5 fL (ref 78.0–100.0)
Platelets: 212 10*3/uL (ref 150–400)
RBC: 2.89 MIL/uL — ABNORMAL LOW (ref 3.87–5.11)
RDW: 13.6 % (ref 11.5–15.5)
WBC: 14.1 10*3/uL — ABNORMAL HIGH (ref 4.0–10.5)

## 2015-02-21 MED ORDER — FERROUS SULFATE 325 (65 FE) MG PO TABS
325.0000 mg | ORAL_TABLET | Freq: Two times a day (BID) | ORAL | Status: DC
  Administered 2015-02-21 – 2015-02-22 (×2): 325 mg via ORAL
  Filled 2015-02-21 (×2): qty 1

## 2015-02-21 NOTE — Lactation Note (Signed)
This note was copied from the chart of Boy Ulla PotashJasmine Lal. Lactation Consultation Note  Patient Name: Boy Ulla PotashJasmine Gudino WUJWJ'XToday's Date: 02/21/2015 Reason for consult: Follow-up assessment Mom stated that she would like to bf but she is worried that baby is not getting enough. Discussed baby belly size, feeding frequency, voids, wt loss/wt gain, baby behavior, breast changes, milk transition, pumping, and supplementing. Mom was able to demonstrate manual expression, colostrum was noted bilaterally. Mom does have wide spaced breasts that are somewhat cone shaped with a larger areola and short shaft nipples. She plans to latch baby at every feeding and f/u with formula as needed. She is aware of OP services and support group. She will call as needed for bf help.   Maternal Data    Feeding Feeding Type: Formula Nipple Type: Slow - flow  LATCH Score/Interventions                      Lactation Tools Discussed/Used     Consult Status Consult Status: PRN    Rulon Eisenmengerlizabeth E Ronith Berti 02/21/2015, 12:13 PM

## 2015-02-21 NOTE — Progress Notes (Signed)
Subjective: Postpartum Day 1: Cesarean Delivery and BTL Patient reports tolerating PO, + flatus and no problems voiding.  Bottlefeeding  Objective: Vital signs in last 24 hours: Temp:  [97.4 F (36.3 C)-98.6 F (37 C)] 98 F (36.7 C) (11/26 0519) Pulse Rate:  [50-66] 50 (11/26 0519) Resp:  [18] 18 (11/26 0519) BP: (98-138)/(59-76) 126/63 mmHg (11/26 0519) SpO2:  [98 %-100 %] 100 % (11/26 0137)  Physical Exam:  General: alert, cooperative and appears stated age CVS:  RRR, without murmur, gallops, or rubs Lungs:  CTA bilat ABD:  +BSx4, normal Lochia: appropriate Uterine Fundus: firm Incision: Drainage has not extended beyond demarcation on dressing DVT Evaluation: No evidence of DVT seen on physical exam. Negative Homan's sign.   Recent Labs  02/20/15 0400 02/21/15 0510  HGB 10.3* 8.2*  HCT 31.1* 25.3*    Assessment/Plan: Status post Cesarean section. Doing well postoperatively.  Continue current care.  Kathrine Haddock N 02/21/2015, 7:11 AM

## 2015-02-21 NOTE — Clinical Social Work Maternal (Signed)
  CLINICAL SOCIAL WORK MATERNAL/CHILD NOTE  Patient Details  Name: Amber Blanchard MRN: [id number] Date of Birth: June 23, 1990  Date:  02/21/2015  Clinical Social Worker Initiating Note:  Norlene Duel, LCSW Date/ Time Initiated:  02/21/15/1230     Child's Name:  Amber Blanchard   Legal Guardian:  Mother   Need for Interpreter:  None   Date of Referral:  02/21/15     Reason for Referral:  Other (Comment)   Referral Source:  [facility]   Address:  [address]  [place], [place] [address]  Phone number:   [phone])   Household Members:  Parents, Minor Children   Natural Supports (not living in the home):  Immediate Family, Extended Family   Professional Supports: None   Employment:  (mother is employed)   Type of Work:     Education:      Pensions consultant:  Multimedia programmer   Other Resources:      Cultural/Religious Considerations Which May Impact Care:  none noted  Strengths:  Ability to meet basic needs , Home prepared for child , Pediatrician chosen    Risk Factors/Current Problems:      Cognitive State:  Alert , Able to Concentrate    Mood/Affect:  Happy    CSW Assessment:  Acknowledged order for social work consult to assess mother's hx of marijuana use.  Met with mother who was pleasant and receptive to CSW.   Maternal aunt was present and attentive to mother.  FOB is reportedly uninvolved and she did not provide his name.   She admits to recreational use of marijuana during pregnancy.   She denies any treatment history or need for treatment.   MOB aware of newborn's positive UDS.   She denies any use of alcohol, or other illicit drug use.    Informed that she is well prepared at home for newborn.  Mother informed of social work Fish farm manager.  CSW Plan/Description:     Referral made to DSS.  Spoke with Demetrius Charity and informed that DSS will most likely follow up with mother in the community. Mother informed of the hospital's drug screening  policy.  Also informed her of what to expect if she is referred to DSS.   No current barriers to discharge   Raymonda Pell J, LCSW 02/21/2015, 3:12 PM

## 2015-02-21 NOTE — Anesthesia Postprocedure Evaluation (Signed)
Anesthesia Post Note  Patient: Amber Blanchard  Procedure(s) Performed: Procedure(s) (LRB): CESAREAN SECTION WITH BILATERAL TUBAL LIGATION (Bilateral)  Patient location during evaluation: Mother Baby Anesthesia Type: Regional Level of consciousness: awake, awake and alert, oriented and patient cooperative Pain management: pain level controlled Vital Signs Assessment: post-procedure vital signs reviewed and stable Respiratory status: spontaneous breathing Cardiovascular status: stable Postop Assessment: No headache, No backache, Patient able to bend at knees and No signs of nausea or vomiting Anesthetic complications: no    Last Vitals:  Filed Vitals:   02/21/15 0137 02/21/15 0519  BP: 119/72 126/63  Pulse: 60 50  Temp: 36.7 C 36.7 C  Resp: 18 18    Last Pain:  Filed Vitals:   02/21/15 0522  PainSc: 0-No pain                 Shirely Toren, Marylene LandAngela Draughon

## 2015-02-22 MED ORDER — OXYCODONE-ACETAMINOPHEN 5-325 MG PO TABS: 1.0000 | ORAL_TABLET | ORAL | Status: DC | PRN

## 2015-02-22 MED ORDER — IBUPROFEN 600 MG PO TABS: 600.0000 mg | ORAL_TABLET | Freq: Four times a day (QID) | ORAL | Status: DC | PRN

## 2015-02-22 MED ORDER — FERROUS SULFATE 325 (65 FE) MG PO TABS: 325.0000 mg | ORAL_TABLET | Freq: Two times a day (BID) | ORAL | Status: DC

## 2015-02-22 NOTE — Discharge Instructions (Signed)
°Iron-Rich Diet ° °Iron is a mineral that helps your body to produce hemoglobin. Hemoglobin is a protein in your red blood cells that carries oxygen to your body's tissues. Eating too little iron may cause you to feel weak and tired, and it can increase your risk for infection. Eating enough iron is necessary for your body's metabolism, muscle function, and nervous system. °Iron is naturally found in many foods. It can also be added to foods or fortified in foods. There are two types of dietary iron: °· Heme iron. Heme iron is absorbed by the body more easily than nonheme iron. Heme iron is found in meat, poultry, and fish. °· Nonheme iron. Nonheme iron is found in dietary supplements, iron-fortified grains, beans, and vegetables. °You may need to follow an iron-rich diet if: °· You have been diagnosed with iron deficiency or iron-deficiency anemia. °· You have a condition that prevents you from absorbing dietary iron, such as: °¨ Infection in your intestines. °¨ Celiac disease. This involves long-lasting (chronic) inflammation of your intestines. °· You do not eat enough iron. °· You eat a diet that is high in foods that impair iron absorption. °· You have lost a lot of blood. °· You have heavy bleeding during your menstrual cycle. °· You are pregnant. °WHAT IS MY PLAN? °Your health care provider may help you to determine how much iron you need per day based on your condition. Generally, when a person consumes sufficient amounts of iron in the diet, the following iron needs are met: °· Men. °¨ 14-18 years old: 11 mg per day. °¨ 19-50 years old: 8 mg per day. °· Women.   °¨ 14-18 years old: 15 mg per day. °¨ 19-50 years old: 18 mg per day. °¨ Over 50 years old: 8 mg per day. °¨ Pregnant women: 27 mg per day. °¨ Breastfeeding women: 9 mg per day. °WHAT DO I NEED TO KNOW ABOUT AN IRON-RICH DIET? °· Eat fresh fruits and vegetables that are high in vitamin C along with foods that are high in iron. This will help  increase the amount of iron that your body absorbs from food, especially with foods containing nonheme iron. Foods that are high in vitamin C include oranges, peppers, tomatoes, and mango. °· Take iron supplements only as directed by your health care provider. Overdose of iron can be life-threatening. If you were prescribed iron supplements, take them with orange juice or a vitamin C supplement. °· Cook foods in pots and pans that are made from iron.   °· Eat nonheme iron-containing foods alongside foods that are high in heme iron. This helps to improve your iron absorption.   °· Certain foods and drinks contain compounds that impair iron absorption. Avoid eating these foods in the same meal as iron-rich foods or with iron supplements. These include: °¨ Coffee, black tea, and red wine. °¨ Milk, dairy products, and foods that are high in calcium. °¨ Beans, soybeans, and peas. °¨ Whole grains. °· When eating foods that contain both nonheme iron and compounds that impair iron absorption, follow these tips to absorb iron better.   °¨ Soak beans overnight before cooking. °¨ Soak whole grains overnight and drain them before using. °¨ Ferment flours before baking, such as using yeast in bread dough. °WHAT FOODS CAN I EAT? °Grains  °Iron-fortified breakfast cereal. Iron-fortified whole-wheat bread. Enriched rice. Sprouted grains. °Vegetables  °Spinach. Potatoes with skin. Green peas. Broccoli. Red and green bell peppers. Fermented vegetables. °Fruits  °Prunes. Raisins. Oranges. Strawberries. Mango. Grapefruit. °Meats and Other   Protein Sources Beef liver. Oysters. Beef. Shrimp. [place]. Chicken. [place]. Sardines. Chickpeas. Nuts. Tofu. Beverages Tomato juice. Fresh orange juice. Prune juice. Hibiscus tea. Fortified instant breakfast shakes. Condiments Tahini. Fermented soy sauce. Sweets and Desserts Black-strap molasses.  Other Wheat germ. The items listed above may not be a complete list of recommended foods or  beverages. Contact your dietitian for more options. WHAT FOODS ARE NOT RECOMMENDED? Grains Whole grains. Bran cereal. Bran flour. Oats. Vegetables Artichokes. Brussels sprouts. Kale. Fruits Blueberries. Raspberries. Strawberries. Figs. Meats and Other Protein Sources Soybeans. Products made from soy protein. Dairy Milk. Cream. Cheese. Yogurt. Cottage cheese. Beverages Coffee. Black tea. Red wine. Sweets and Desserts Cocoa. Chocolate. Ice cream. Other Basil. Oregano. Parsley. The items listed above may not be a complete list of foods and beverages to avoid. Contact your dietitian for more information.   This information is not intended to replace advice given to you by your health care provider. Make sure you discuss any questions you have with your health care provider.   Document Released: 10/26/2004 Document Revised: 04/04/2014 Document Reviewed: 10/09/2013 Elsevier Interactive Patient Education 2016 [place]. Postpartum Care After Cesarean Delivery After you deliver your newborn (postpartum period), the usual stay in the hospital is 24-72 hours. If there were problems with your labor or delivery, or if you have other medical problems, you might be in the hospital longer.  While you are in the hospital, you will receive help and instructions on how to care for yourself and your newborn during the postpartum period.  While you are in the hospital:  It is normal for you to have pain or discomfort from the incision in your abdomen. Be sure to tell your nurses when you are having pain, where the pain is located, and what makes the pain worse.  If you are breastfeeding, you may feel uncomfortable contractions of your uterus for a couple of weeks. This is normal. The contractions help your uterus get back to normal size.  It is normal to have some bleeding after delivery.  For the first 1-3 days after delivery, the flow is red and the amount may be similar to a  period.  It is common for the flow to start and stop.  In the first few days, you may pass some small clots. Let your nurses know if you begin to pass large clots or your flow increases.  Do not  flush blood clots down the toilet before having the nurse look at them.  During the next 3-10 days after delivery, your flow should become more watery and pink or brown-tinged in color.  Ten to fourteen days after delivery, your flow should be a small amount of yellowish-white discharge.  The amount of your flow will decrease over the first few weeks after delivery. Your flow may stop in 6-8 weeks. Most women have had their flow stop by 12 weeks after delivery.  You should change your sanitary pads frequently.  Wash your hands thoroughly with soap and water for at least 20 seconds after changing pads, using the toilet, or before holding or feeding your newborn.  Your intravenous (IV) tubing will be removed when you are drinking enough fluids.  The urine drainage tube (urinary catheter) that was inserted before delivery may be removed within 6-8 hours after delivery or when feeling returns to your legs. You should feel like you need to empty your bladder within the first 6-8 hours after the catheter has been removed.  In case you become  weak, lightheaded, or faint, call your nurse before you get out of bed for the first time and before you take a shower for the first time.  Within the first few days after delivery, your breasts may begin to feel tender and full. This is called engorgement. Breast tenderness usually goes away within 48-72 hours after engorgement occurs. You may also notice milk leaking from your breasts. If you are not breastfeeding, do not stimulate your breasts. Breast stimulation can make your breasts produce more milk.  Spending as much time as possible with your newborn is very important. During this time, you and your newborn can feel close and get to know each other. Having  your newborn stay in your room (rooming in) will help to strengthen the bond with your newborn. It will give you time to get to know your newborn and become comfortable caring for your newborn.  Your hormones change after delivery. Sometimes the hormone changes can temporarily cause you to feel sad or tearful. These feelings should not last more than a few days. If these feelings last longer than that, you should talk to your caregiver.  If desired, talk to your caregiver about methods of family planning or contraception.  Talk to your caregiver about immunizations. Your caregiver may want you to have the following immunizations before leaving the hospital:  Tetanus, diphtheria, and pertussis (Tdap) or tetanus and diphtheria (Td) immunization. It is very important that you and your family (including grandparents) or others caring for your newborn are up-to-date with the Tdap or Td immunizations. The Tdap or Td immunization can help protect your newborn from getting ill.  Rubella immunization.  Varicella (chickenpox) immunization.  Influenza immunization. You should receive this annual immunization if you did not receive the immunization during your pregnancy.   This information is not intended to replace advice given to you by your health care provider. Make sure you discuss any questions you have with your health care provider.   Document Released: 12/07/2011 Document Reviewed: 12/07/2011 Elsevier Interactive Patient Education [employer].

## 2015-02-22 NOTE — Lactation Note (Signed)
This note was copied from the chart of [name]. Lactation Consultation Note  Patient Name: [name] PIRJJ'OToday's Date: 02/22/2015 Reason for consult: Follow-up assessment   Follow up with mom prior to D/C. Infant now 7152 hours old and weighs 6 lb 3.5 oz with a weight loss of 3% since birth. Infant with 1 BF attempt for 5 minutes, 8 bottle feedings of formula of 9-18cc, 5 voids and 2 stools in last 24 hours. MOm reports she want to breast and bottle feed. She has not been pumping. Discussed supply and demand with mom and need for regular stimulation to breast to ensure and adequate supply, mom voiced understanding. She has a manual pump and says she may purchase an electric pump. [facility] and [facility]. Mom aware of OP services and phone #. Enc her to call with questions/concerns.    Maternal Data Formula Feeding for Exclusion: No Has patient been taught Hand Expression?: Yes Does the patient have breastfeeding experience prior to this delivery?: No  Feeding    LATCH Score/Interventions                      Lactation Tools Discussed/Used WIC Program: Yes Pump Review: Setup, frequency, and cleaning;Milk Storage   Consult Status Consult Status: Complete Follow-up type: Call as needed    Ed BlalockSharon S Elihue Ebert 02/22/2015, 9:35 AM

## 2015-02-22 NOTE — Discharge Summary (Signed)
OB Discharge Summary     Patient Name: Amber JoyJasmine N Partin DOB: [birth year] MRN: [id number]  Date of admission: 02/20/2015 Delivering MD:     Date of discharge: 02/22/2015  Admitting diagnosis: 38 WKS, CTXS Intrauterine pregnancy: [id number]     Secondary diagnosis:  Active Problems:   Active labor at term   Active labor  Additional problems: Prev C/S w/ desire for repeat; desire for sterilization     Discharge diagnosis: Term Pregnancy Delivered, Anemia and and BTL                                                                                                Post partum procedures:none  Augmentation: N/A  Complications: None  Hospital course:  Sceduled C/S, but pt presented in active labor on 02/20/15.  24 y.o. yo G3P2011 at 8056w3d was admitted to the hospital 02/20/2015 for scheduled cesarean section with the following indication:Elective Repeat.  Membrane Rupture Time/Date:   ,    Patient delivered a Viable infant.02/20/2015  Details of operation can be found in separate operative note.  Pateint had an uncomplicated postpartum course.  She is ambulating, tolerating a regular diet, passing flatus, and urinating well. Patient is discharged home in stable condition on No discharge date for patient encounter.          Physical exam  Filed Vitals:   02/21/15 0137 02/21/15 0519 02/21/15 1822 02/22/15 0607  BP: 119/72 126/63 128/68 126/68  Pulse: 60 50 56 57  Temp: 98.1 F (36.7 C) 98 F (36.7 C) 98.3 F (36.8 C) 98.1 F (36.7 C)  TempSrc:  Oral Oral Oral  Resp: 18 18 18 20   SpO2: 100%      General: alert and cooperative Lochia: appropriate Uterine Fundus: firm Incision: Dressing is clean, dry, and intact DVT Evaluation: No evidence of DVT seen on physical exam. Labs: Lab Results  Component Value Date   WBC 14.1* 02/21/2015   HGB 8.2* 02/21/2015   HCT 25.3* 02/21/2015   MCV 87.5 02/21/2015   PLT 212 02/21/2015   CMP Latest Ref Rng 11/27/2013  Glucose 70 - 99 mg/dL 85   BUN 6 - 23 mg/dL 8  Creatinine 0.4 - 1.2 mg/dL 0.7  Sodium 829135 - 562145 mEq/L 136  Potassium 3.5 - 5.1 mEq/L 3.3(L)  Chloride 96 - 112 mEq/L 106  CO2 19 - 32 mEq/L 24  Calcium 8.4 - 10.5 mg/dL 8.4  Total Protein 6.0 - 8.3 g/dL 7.2  Total Bilirubin 0.2 - 1.2 mg/dL 0.9  Alkaline Phos 39 - 117 U/L 68  AST 0 - 37 U/L 17  ALT 0 - 35 U/L 11    Discharge instruction: per After Visit Summary and "Baby and Me Booklet".  After visit meds:    Medication List    STOP taking these medications        TYLENOL 325 MG Caps  Generic drug:  Acetaminophen      TAKE these medications        ferrous sulfate 325 (65 FE) MG tablet  Take 1 tablet (325 mg total) by mouth 2 (two) times  daily with a meal.     ibuprofen 600 MG tablet  Commonly known as:  ADVIL,MOTRIN  Take 1 tablet (600 mg total) by mouth every 6 (six) hours as needed.     oxyCODONE-acetaminophen 5-325 MG tablet  Commonly known as:  PERCOCET/ROXICET  Take 1 tablet by mouth every 4 (four) hours as needed (for pain scale 4-7).     Prenatal Vitamins 0.8 MG tablet  Take 1 tablet by mouth daily.        Diet: routine diet  Activity: Advance as tolerated. Pelvic rest for 6 weeks.   Outpatient follow up:6 weeks Follow up Appt:Future Appointments Date Time Provider Department Center  04/01/2015 2:30 PM Dara Lords, MD [facility]   Follow up Visit:No Follow-up on file.  Postpartum contraception: Tubal Ligation  Newborn Data: Live born female  Birth Weight: 6 lb 7 oz (2920 g) APGAR: 8, 9  Baby Feeding: Bottle Disposition:home with mother   02/22/2015 Cam Hai, CNM  8:25 AM

## 2015-02-23 ENCOUNTER — Inpatient Hospital Stay (HOSPITAL_COMMUNITY)
Admission: RE | Admit: 2015-02-23 | Discharge: 2015-02-23 | Disposition: A | Discharge status: Home / Self Care | Payer: 59 | Source: Ambulatory Visit

## 2015-02-23 ENCOUNTER — Encounter (HOSPITAL_COMMUNITY): Payer: Self-pay | Admitting: Obstetrics & Gynecology

## 2015-02-24 ENCOUNTER — Encounter (HOSPITAL_COMMUNITY): Admission: RE | Payer: Self-pay | Source: Ambulatory Visit

## 2015-02-24 ENCOUNTER — Encounter: Payer: 59 | Admitting: Certified Nurse Midwife

## 2015-02-24 ENCOUNTER — Inpatient Hospital Stay (HOSPITAL_COMMUNITY): Admission: RE | Admit: 2015-02-24 | Payer: 59 | Source: Ambulatory Visit | Admitting: Obstetrics & Gynecology

## 2015-02-24 SURGERY — Surgical Case
Anesthesia: Regional | Laterality: Bilateral

## 2015-02-26 ENCOUNTER — Telehealth: Payer: Self-pay | Admitting: *Deleted

## 2015-02-26 NOTE — Telephone Encounter (Signed)
Amber Blanchard left a message this am stating she had a c/s 26th and now her belly button has discharge and foul smell.   Called Amber Blanchard and she reports yellowish discharge from her belly button and bad smell. States is tender to touch. States she has been showering and cleaning it with soap /water and then peroxide but still has yellowish discharge and smell. States her c/section incision  Is clean , intact without redress, discharge or tenderness. Denies fever.   Offered her 10am work in appointment for tomorrow which she agreed to.

## 2015-02-27 ENCOUNTER — Encounter (HOSPITAL_COMMUNITY): Payer: Self-pay | Admitting: *Deleted

## 2015-02-27 ENCOUNTER — Ambulatory Visit: Payer: 59 | Admitting: Family Medicine

## 2015-03-03 ENCOUNTER — Encounter: Payer: 59 | Admitting: Student

## 2015-03-04 NOTE — Telephone Encounter (Signed)
Patient never returned my call.  Has since delivered.

## 2015-03-30 ENCOUNTER — Ambulatory Visit (INDEPENDENT_AMBULATORY_CARE_PROVIDER_SITE_OTHER): Payer: 59 | Admitting: Obstetrics & Gynecology

## 2015-03-30 ENCOUNTER — Encounter: Payer: Self-pay | Admitting: Obstetrics & Gynecology

## 2015-03-30 VITALS — BP 134/79 | HR 83 | Temp 98.6°F | Ht 64.0 in | Wt 204.0 lb

## 2015-03-30 DIAGNOSIS — A084 Viral intestinal infection, unspecified: Secondary | ICD-10-CM

## 2015-03-30 MED ORDER — ONDANSETRON 4 MG PO TBDP: 4.0000 mg | ORAL_TABLET | Freq: Four times a day (QID) | ORAL | Status: DC | PRN

## 2015-03-30 NOTE — Progress Notes (Signed)
Patient ID: Amber Blanchard, female   DOB: [birth year], 25 y.o.   MRN: [id number] Subjective: nausea vomiting diarrhea today     Amber Blanchard is a 25 y.o. female who presents for a postpartum visit. She is 5 weeks postpartum following a low cervical transverse Cesarean section. I have fully reviewed the prenatal and intrapartum course. The delivery was at 38.3 gestational weeks. Outcome: repeat cesarean section, low transverse incision. Anesthesia: spinal. Postpartum course has been unremarkable. Baby's course has been unremarkable. Baby is feeding by bottle - Similac Advance. Bleeding no bleeding. Bowel function is normal. Bladder function is normal. Patient is not sexually active. Contraception method is tubal ligation. Postpartum depression screening: negative.  The following portions of the patient's history were reviewed and updated as appropriate: allergies, current medications, past family history, past medical history, past social history, past surgical history and problem list.  Review of Systems Gastrointestinal: positive for diarrhea, nausea and vomiting   Objective:    BP 139/99 mmHg  Pulse 83  Temp(Src) 98.6 F (37 C) (Oral)  Ht 5' 4"  (1.626 m)  Wt 204 lb (92.534 kg)  BMI 35.00 kg/m2  LMP 03/27/2015  Breastfeeding? No  General:  alert, cooperative and no distress           Abdomen: soft, non-tender; bowel sounds normal; no masses,  no organomegalyincision healing well   Vulva:  not evaluated  Vagina: not evaluated  Cervix:     Corpus: not examined  Adnexa:  not evaluated  Rectal Exam: Not performed.        Assessment:     normal postpartum exam. Pap smear not done at today's visit.  Viral gastroenteritis Plan:    1. Contraception: tubal ligation 2. Zofran 4 mg ODT Q4 h prn  3. Follow up as needed.    Woodroe Mode, MD 03/30/2015

## 2015-04-01 ENCOUNTER — Ambulatory Visit (INDEPENDENT_AMBULATORY_CARE_PROVIDER_SITE_OTHER): Payer: 59 | Admitting: Gynecology

## 2015-04-01 ENCOUNTER — Encounter: Payer: Self-pay | Admitting: Gynecology

## 2015-04-01 VITALS — BP 124/76

## 2015-04-01 DIAGNOSIS — K610 Anal abscess: Secondary | ICD-10-CM

## 2015-04-01 DIAGNOSIS — A630 Anogenital (venereal) warts: Secondary | ICD-10-CM

## 2015-04-01 NOTE — Patient Instructions (Signed)
Office will call to help arrange to see the surgeon.

## 2015-04-01 NOTE — Progress Notes (Signed)
Romilda JoyJasmine N Focht [birth year] [id number]        25 y.o.  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 Recently underwent repeat cesarean section 2 months ago and apparently was treated throughout the pregnancy with TCA with no response. Presents now for consideration of vulvar laser vaporization of the vulvar condyloma.  Past medical history,surgical history, problem list, medications, allergies, family history and social history were all reviewed and documented in the EPIC chart.  Directed ROS with pertinent positives and negatives documented in the history of present illness/assessment and plan.  Exam: Kennon PortelaKim Gardner assistant Filed Vitals:   04/01/15 1435  BP: 124/76   General appearance:  Normal External BUS vagina with multiple flap raised condylomatous appearing lesions primarily to the right of her perianal area. Does not have any on the vulva or vagina. Large pedunculated firm hemorrhoid appearing area at 3:00 anal opening. Draining pinpoint left perianal area with surrounding scar.  Right perianal puckered area with surrounding keloid-like scar consistent with old abscess. Cervix grossly normal. Uterus grossly normal size midline mobile nontender. Adnexa without masses or tenderness. Rectal exam with scar like constriction at the anal opening difficult to insert index finger. No gross internal palpated abnormal areas.  Assessment/Plan:  25 y.o. [id number] with history consistent with chronic perianal abscess. Significant perianal scarring and anal ring like constriction on digital exam.  Draining pinpoint area left perianal region. Multiple flat condylomatous-appearing areas primarily right of perianal region. Large pedunculated area 3:00 anal opening firm appears his hemorrhoid although firmer than normal. Suspect some  underlying issues particularly with the perianal abscess. HIV was recently negative with normal white count.  Recommend patient see colorectal surgeon for further evaluationarea the patient agrees with this and we will help her make this appointment and she will follow up for this.    Dara LordsFONTAINE,Lavell Ridings P MD, 2:54 PM 04/01/2015

## 2015-04-02 ENCOUNTER — Telehealth: Payer: Self-pay | Admitting: *Deleted

## 2015-04-02 NOTE — Telephone Encounter (Signed)
-----   Message from Anastasio Auerbach, MD sent at 04/01/2015  2:52 PM EST ----- Schedule an appointment with Colorectal surgeon AliciaThomas reference chronic perianal abscess, condyloma

## 2015-04-02 NOTE — Telephone Encounter (Signed)
Office notes faxed to [facility] they will fax me back with time and date.  

## 2015-04-08 NOTE — Telephone Encounter (Signed)
Appointment on 04/28/15 @ 5:63OV with Dr. Leighton Ruff pt aware.

## 2015-04-09 ENCOUNTER — Ambulatory Visit: Payer: 59 | Admitting: Obstetrics and Gynecology

## 2015-11-10 IMAGING — CT CT ABD-PELV W/ CM
3 of 4 series · 13 of 36 positions shown, 19 images · IV contrast (READICAT/WATER & [ID] OMNI 300)
Comparison: None.

CLINICAL DATA: Epigastric abdominal pain.

EXAM:
CT ABDOMEN AND PELVIS WITH CONTRAST
TECHNIQUE: Multidetector CT imaging of the abdomen and pelvis was performed
using the standard protocol following bolus administration of
intravenous contrast.
CONTRAST:  125mL OMNIPAQUE IOHEXOL 300 MG/ML  SOLN

[Series 3: abd/pelvis with · axial · 0.84mm/px · z∈[-406,-76]mm · 7 of 86 slices shown, 12 images]
[im 11/86  soft-tissue]
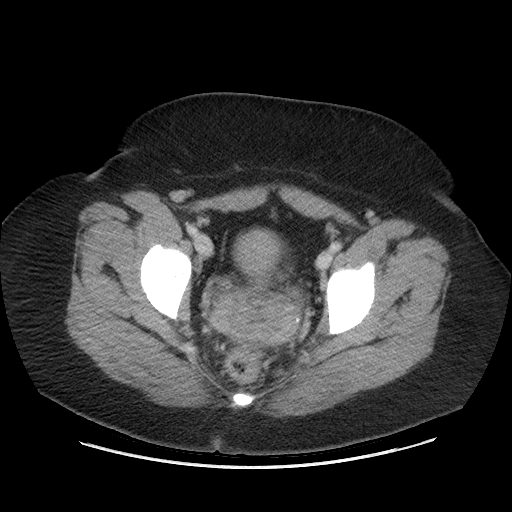
[im 11/86  bone]
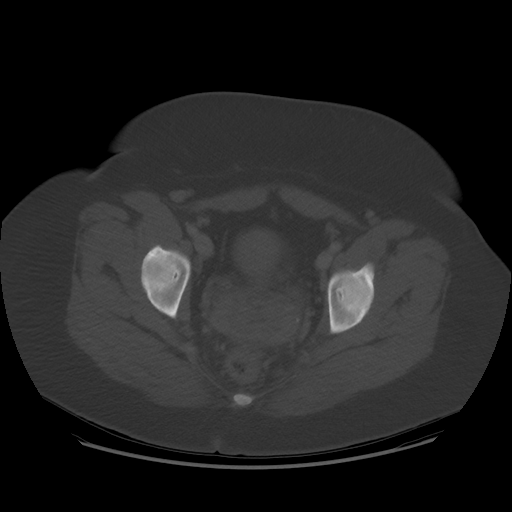
[im 22/86  soft-tissue]
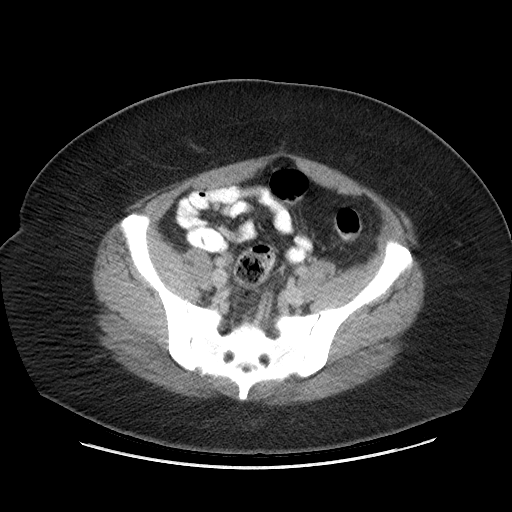
[im 32/86  soft-tissue]
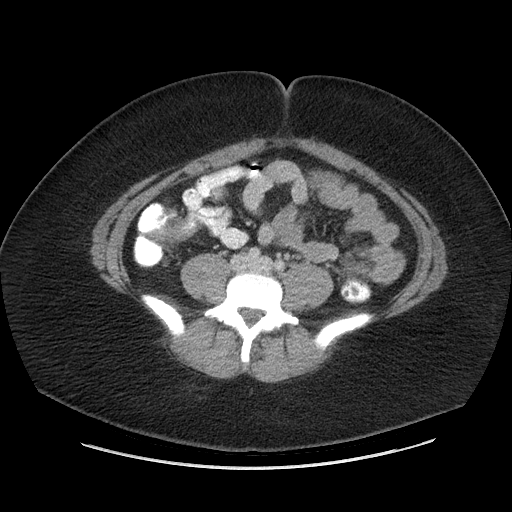
[im 43/86  soft-tissue]
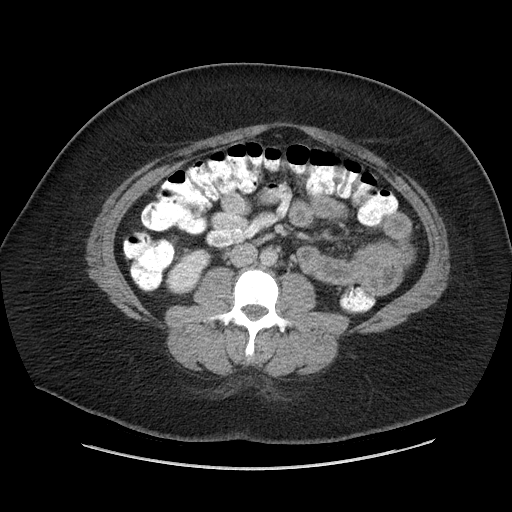
[im 43/86  lung]
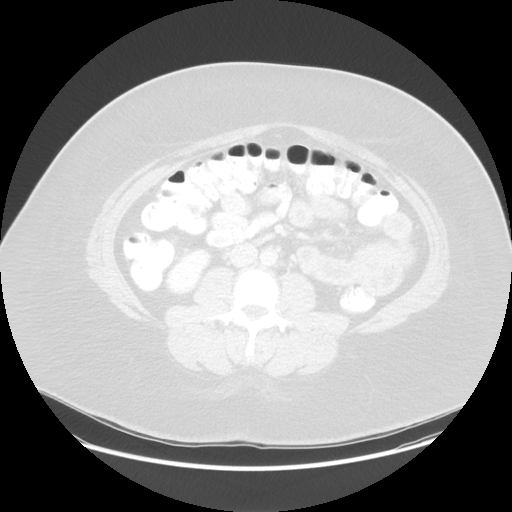
[im 54/86  soft-tissue]
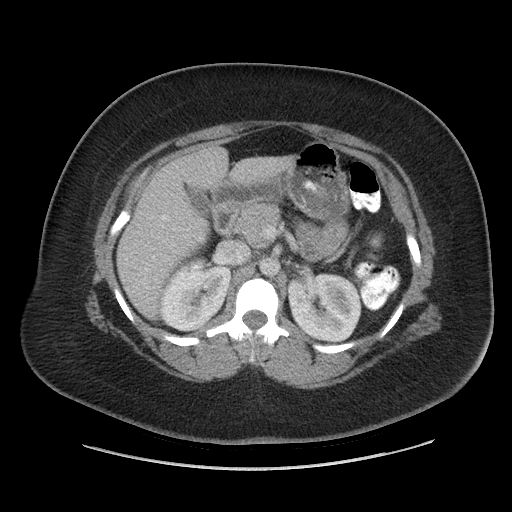
[im 54/86  lung]
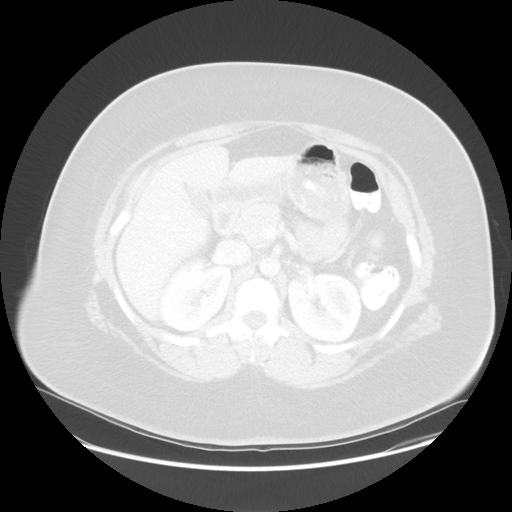
[im 64/86  soft-tissue]
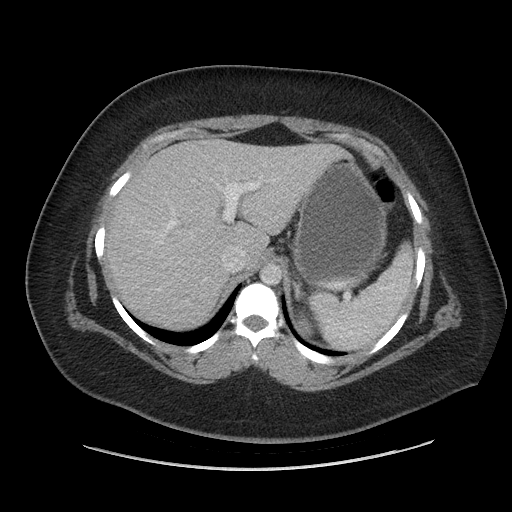
[im 64/86  lung]
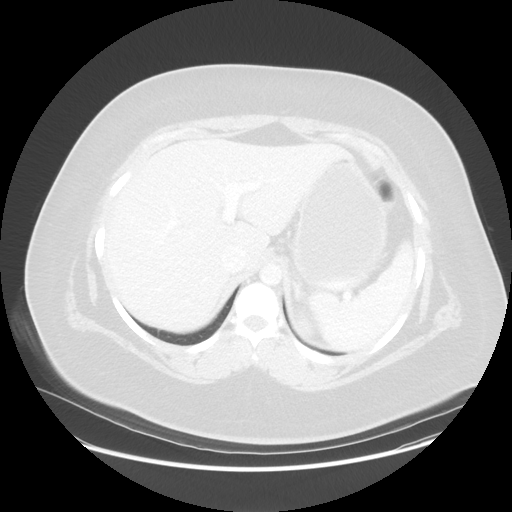
[im 75/86  soft-tissue]
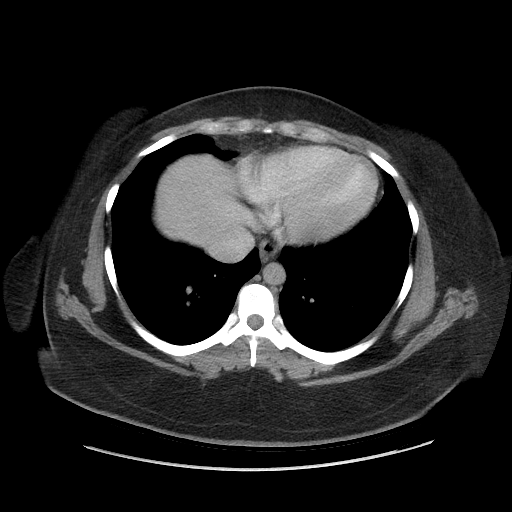
[im 75/86  lung]
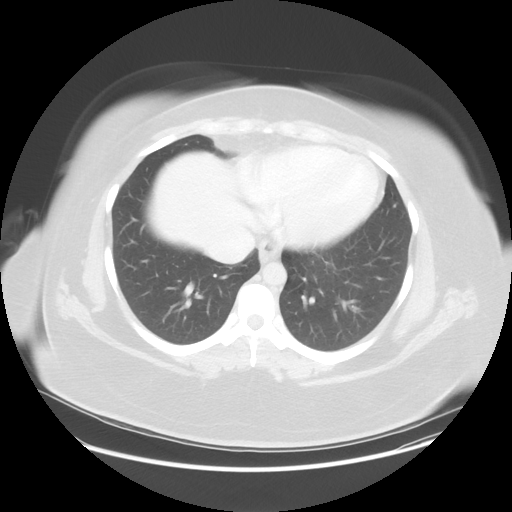

[Series 601: coronal body · coronal · 0.94mm/px · 1 of 135 slices shown, 2 images]
[im 45/135  soft-tissue]
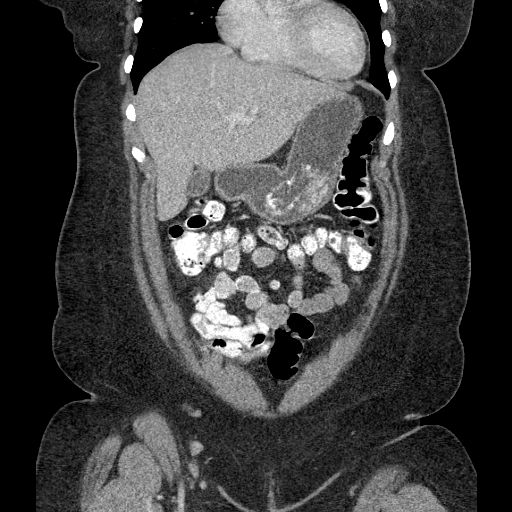
[im 45/135  bone]
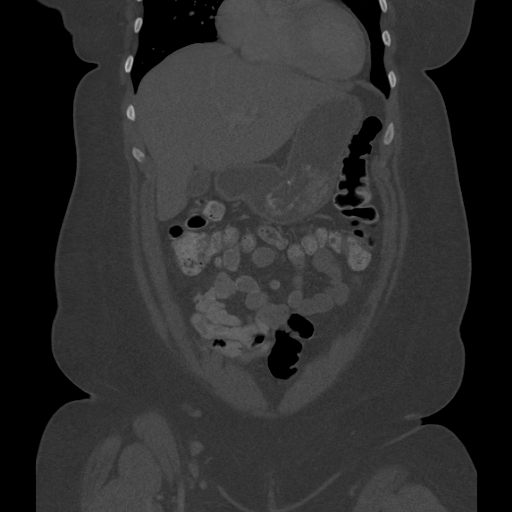

[Series 602: sagittal body · sagittal · 0.94mm/px · 5 of 173 slices shown]
[im 20/173  soft-tissue]
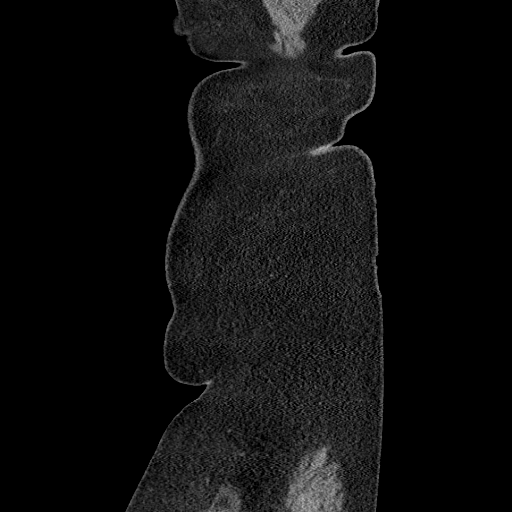
[im 39/173  soft-tissue]
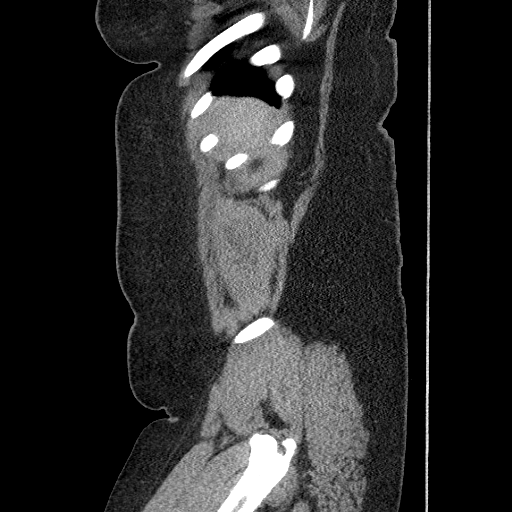
[im 58/173  soft-tissue]
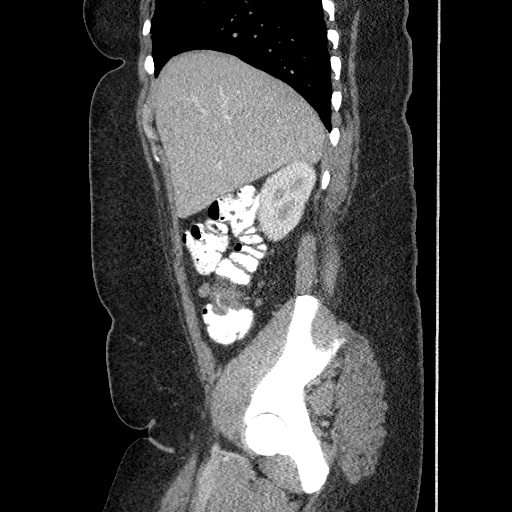
[im 77/173  soft-tissue]
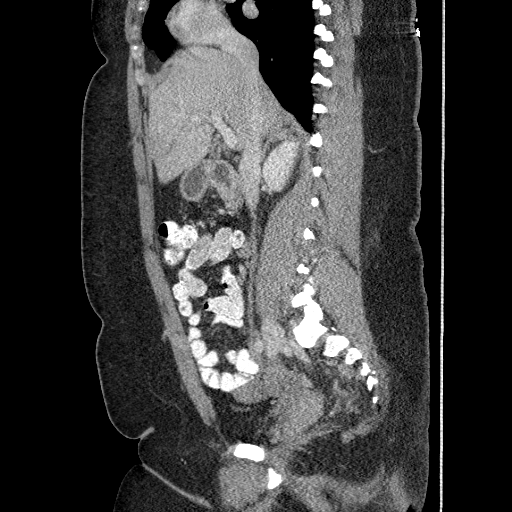
[im 96/173  soft-tissue]
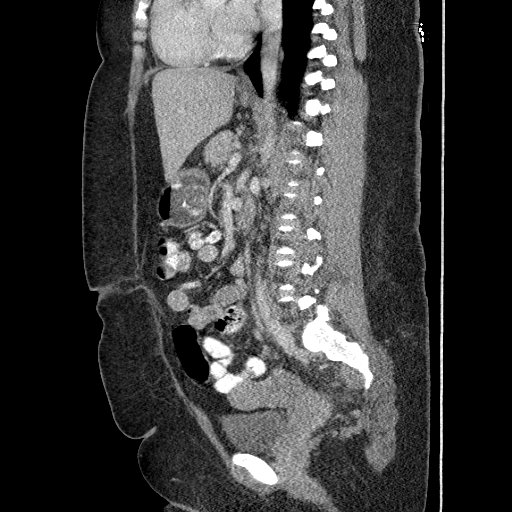

[13 of 36 positions shown; findings below may reference images not displayed]

FINDINGS: Lung bases show no acute findings. Heart size normal. No pericardial
or pleural effusion.

Liver, gallbladder, adrenal glands, kidneys, spleen, pancreas,
stomach and proximal small bowel are unremarkable. There is slight
thickening at the ileocecal junction, but this area is
underdistended. Adjacent ileocolic mesenteric lymph nodes measure up
to 9 mm. Remainder of the colon is unremarkable. Uterus and ovaries
are visualized. Bladder is unremarkable. No free fluid. No worrisome
lytic or sclerotic lesions.
IMPRESSION: Questionable thickening at the ileocolic junction, possibly due to
underdistention. An infectious or inflammatory etiology cannot be
excluded. Borderline enlarged ileocolic mesenteric lymph nodes.

## 2015-11-23 ENCOUNTER — Inpatient Hospital Stay
Admit: 2015-11-23 | Discharge: 2015-11-23 | Disposition: A | Discharge status: Home / Self Care | Payer: Self-pay | Attending: Family Medicine

## 2015-11-23 DIAGNOSIS — K5900 Constipation, unspecified: Secondary | ICD-10-CM

## 2015-11-23 MED ORDER — LUBIPROSTONE 24 MCG CAP
24 mcg | ORAL_CAPSULE | Freq: Every day | ORAL | 1 refills | Status: AC
Start: 2015-11-23 — End: 2015-12-23

## 2015-11-23 NOTE — Other (Signed)
Patient is a 25 y.o. female presenting with constipation. The history is provided by the patient.   Constipation    This is a new problem. The current episode started more than 1 week ago. The stool is described as hard. Associated symptoms include constipation. Pertinent negatives include no abdominal pain, no flatus, no abdominal distention, no chills and no nausea. She has tried oral laxatives for the symptoms. The treatment provided mild relief. Her past medical history does not include dementia, neuromuscular disease, irritable bowel syndrome, neurologic disease, small bowel obstruction, abdominal surgery, nursing home resident, endocrine disease or metabolic disease.        No past medical history on file.     No past surgical history on file.      No family history on file.     Social History     Social History   ??? Marital status: SINGLE     Spouse name: N/A   ??? Number of children: N/A   ??? Years of education: N/A     Occupational History   ??? Not on file.     Social History Main Topics   ??? Smoking status: Not on file   ??? Smokeless tobacco: Not on file   ??? Alcohol use Not on file   ??? Drug use: Not on file   ??? Sexual activity: Not on file     Other Topics Concern   ??? Not on file     Social History Narrative                ALLERGIES: Review of patient's allergies indicates no known allergies.    Review of Systems   Constitutional: Negative for chills.   HENT: Negative for congestion.    Gastrointestinal: Positive for constipation. Negative for abdominal distention, abdominal pain, flatus and nausea.       Vitals:    11/23/15 1902   BP: 144/86   Pulse: (!) 111   Resp: 16   Temp: 98.1 ??F (36.7 ??C)   SpO2: 98%   Weight: 82.6 kg (182 lb 1.6 oz)   Height: 5\' 3"  (1.6 m)       Physical Exam   Constitutional: She is oriented to person, place, and time. She appears well-developed and well-nourished.   Eyes: Conjunctivae and EOM are normal.   Cardiovascular: Normal rate and regular rhythm.     Pulmonary/Chest: Effort normal and breath sounds normal.   Abdominal: Soft. Bowel sounds are normal. She exhibits no distension and no mass. There is no tenderness. There is no rebound and no guarding.   Neurological: She is alert and oriented to person, place, and time.   Skin: Skin is warm and dry.   Psychiatric: She has a normal mood and affect. Her behavior is normal. Judgment and thought content normal.   Nursing note and vitals reviewed.      MDM     Differential Diagnosis; Clinical Impression; Plan:     CLINICAL IMPRESSION:  Constipation, unspecified constipation type  (primary encounter diagnosis)    Plan:  1. Amitiza  2. Increase fiber intake  3.   Risk of Significant Complications, Morbidity, and/or Mortality:   Presenting problems:  Moderate  Diagnostic procedures:  Moderate  Management options:  Moderate  Progress:   Patient progress:  Stable      Procedures

## 2016-05-29 ENCOUNTER — Emergency Department (HOSPITAL_COMMUNITY)
Admission: EM | Admit: 2016-05-29 | Discharge: 2016-05-29 | Disposition: A | Discharge status: Home / Self Care | Payer: No Typology Code available for payment source | Attending: Emergency Medicine | Admitting: Emergency Medicine

## 2016-05-29 ENCOUNTER — Emergency Department (HOSPITAL_COMMUNITY): Payer: No Typology Code available for payment source

## 2016-05-29 ENCOUNTER — Encounter (HOSPITAL_COMMUNITY): Payer: Self-pay

## 2016-05-29 DIAGNOSIS — Y92410 Unspecified street and highway as the place of occurrence of the external cause: Secondary | ICD-10-CM | POA: Insufficient documentation

## 2016-05-29 DIAGNOSIS — Y939 Activity, unspecified: Secondary | ICD-10-CM | POA: Insufficient documentation

## 2016-05-29 DIAGNOSIS — Y999 Unspecified external cause status: Secondary | ICD-10-CM | POA: Diagnosis not present

## 2016-05-29 DIAGNOSIS — M549 Dorsalgia, unspecified: Secondary | ICD-10-CM | POA: Diagnosis present

## 2016-05-29 DIAGNOSIS — M542 Cervicalgia: Secondary | ICD-10-CM | POA: Insufficient documentation

## 2016-05-29 DIAGNOSIS — Z79899 Other long term (current) drug therapy: Secondary | ICD-10-CM | POA: Insufficient documentation

## 2016-05-29 LAB — POC URINE PREG, ED: Preg Test, Ur: NEGATIVE

## 2016-05-29 MED ORDER — CYCLOBENZAPRINE HCL 10 MG PO TABS: 10.0000 mg | ORAL_TABLET | Freq: Two times a day (BID) | ORAL | 0 refills | Status: DC | PRN
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
## 2016-05-29 NOTE — Discharge Instructions (Signed)
Please read attached information. If you experience any new or worsening signs or symptoms please return to the emergency room for evaluation. Please follow-up with your primary care provider or specialist as discussed. Please use medication prescribed only as directed and discontinue taking if you have any concerning signs or symptoms.   °
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
## 2016-10-10 ENCOUNTER — Other Ambulatory Visit: Payer: Self-pay | Admitting: General Surgery

## 2016-12-05 ENCOUNTER — Encounter (HOSPITAL_BASED_OUTPATIENT_CLINIC_OR_DEPARTMENT_OTHER): Payer: Self-pay | Admitting: *Deleted

## 2016-12-08 ENCOUNTER — Encounter (HOSPITAL_BASED_OUTPATIENT_CLINIC_OR_DEPARTMENT_OTHER): Admission: RE | Payer: Self-pay | Source: Ambulatory Visit

## 2016-12-08 ENCOUNTER — Ambulatory Visit (HOSPITAL_BASED_OUTPATIENT_CLINIC_OR_DEPARTMENT_OTHER): Admission: RE | Admit: 2016-12-08 | Payer: Medicaid Other | Source: Ambulatory Visit | Admitting: General Surgery

## 2016-12-08 HISTORY — DX: Stenosis of anus and rectum: K62.4

## 2016-12-08 HISTORY — DX: Anogenital (venereal) warts: A63.0

## 2016-12-08 SURGERY — EXAM UNDER ANESTHESIA
Anesthesia: Monitor Anesthesia Care

## 2017-04-19 ENCOUNTER — Encounter: Payer: 59 | Admitting: Women's Health

## 2017-09-04 ENCOUNTER — Inpatient Hospital Stay (HOSPITAL_COMMUNITY)
Admission: AD | Admit: 2017-09-04 | Discharge: 2017-09-04 | Disposition: A | Discharge status: Home / Self Care | Payer: 59 | Source: Ambulatory Visit | Attending: Gynecology | Admitting: Gynecology

## 2017-09-04 ENCOUNTER — Encounter (HOSPITAL_COMMUNITY): Payer: Self-pay | Admitting: *Deleted

## 2017-09-04 DIAGNOSIS — N898 Other specified noninflammatory disorders of vagina: Secondary | ICD-10-CM | POA: Diagnosis present

## 2017-09-04 DIAGNOSIS — B9689 Other specified bacterial agents as the cause of diseases classified elsewhere: Secondary | ICD-10-CM

## 2017-09-04 DIAGNOSIS — N760 Acute vaginitis: Secondary | ICD-10-CM | POA: Insufficient documentation

## 2017-09-04 DIAGNOSIS — Z9889 Other specified postprocedural states: Secondary | ICD-10-CM | POA: Insufficient documentation

## 2017-09-04 LAB — URINALYSIS, ROUTINE W REFLEX MICROSCOPIC
Bilirubin Urine: NEGATIVE
Glucose, UA: NEGATIVE mg/dL
Hgb urine dipstick: NEGATIVE
Ketones, ur: 5 mg/dL — AB
Nitrite: NEGATIVE
Protein, ur: NEGATIVE mg/dL
Specific Gravity, Urine: 1.008 (ref 1.005–1.030)
pH: 6 (ref 5.0–8.0)

## 2017-09-04 LAB — WET PREP, GENITAL
Sperm: NONE SEEN
Trich, Wet Prep: NONE SEEN
Yeast Wet Prep HPF POC: NONE SEEN

## 2017-09-04 LAB — POCT PREGNANCY, URINE: Preg Test, Ur: NEGATIVE

## 2017-09-04 MED ORDER — FLUCONAZOLE 150 MG PO TABS: 150.0000 mg | ORAL_TABLET | Freq: Once | ORAL | 2 refills | Status: AC

## 2017-09-04 MED ORDER — METRONIDAZOLE 500 MG PO TABS: 500.0000 mg | ORAL_TABLET | Freq: Two times a day (BID) | ORAL | 3 refills | Status: DC

## 2017-09-04 NOTE — MAU Note (Signed)
PT  SAYS SHE HAS VAG D/C - NOTICED [facility]  YESTERDAY.   NO BIRTH CONTROL.  LAST SEX- 6 MTHS AGO.    HAS HAD BV IN PAST

## 2017-09-04 NOTE — MAU Provider Note (Signed)
Chief Complaint: Vaginal Discharge   None     SUBJECTIVE HPI: Amber Blanchard is a 27 y.o. [id number] who presents to maternity admissions reporting vaginal discharge with odor starting today after her period stopped. She has had this symptom before when she had BV. She denies any pain, n/v, or vaginal bleeding. There are no other symptoms. She has not tried any treatments.    HPI  Past Medical History:  Diagnosis Date  . Anal stricture   . Anemia   . Cholelithiasis   . External hemorrhoids   . Genital condyloma, female   . HPV in female    Past Surgical History:  Procedure Laterality Date  . CESAREAN SECTION  10/21/2010   Procedure: CESAREAN SECTION;  Surgeon: Jessee Aversara J. Cole;  Location: [facility];  Service: Gynecology;  Laterality: N/A;  . CESAREAN SECTION WITH BILATERAL TUBAL LIGATION Bilateral 02/20/2015   Procedure: CESAREAN SECTION WITH BILATERAL TUBAL LIGATION;  Surgeon: Adam PhenixJames G Arnold, MD;  Location: [facility];  Service: Obstetrics;  Laterality: Bilateral;   Social History   Socioeconomic History  . Marital status: Single    Spouse name: Not on file  . Number of children: Not on file  . Years of education: Not on file  . Highest education level: Not on file  Occupational History  . Not on file  Social Needs  . Financial resource strain: Not on file  . Food insecurity:    Worry: Not on file    Inability: Not on file  . Transportation needs:    Medical: Not on file    Non-medical: Not on file  Tobacco Use  . Smoking status: Never Smoker  . Smokeless tobacco: Never Used  Substance and Sexual Activity  . Alcohol use: No  . Drug use: Yes    Types: Marijuana  . Sexual activity: Not Currently  Lifestyle  . Physical activity:    Days per week: Not on file    Minutes per session: Not on file  . Stress: Not on file  Relationships  . Social connections:    Talks on phone: Not on file    Gets together: Not on file    Attends religious service: Not on file    Active member  of club or organization: Not on file    Attends meetings of clubs or organizations: Not on file    Relationship status: Not on file  . Intimate partner violence:    Fear of current or ex partner: Not on file    Emotionally abused: Not on file    Physically abused: Not on file    Forced sexual activity: Not on file  Other Topics Concern  . Not on file  Social History Narrative  . Not on file   No current facility-administered medications on file prior to encounter.    No current outpatient medications on file prior to encounter.   No Known Allergies  ROS:  Review of Systems  Constitutional: Negative for chills, fatigue and fever.  Respiratory: Negative for shortness of breath.   Cardiovascular: Negative for chest pain.  Gastrointestinal: Negative for nausea and vomiting.  Genitourinary: Positive for vaginal discharge. Negative for difficulty urinating, dysuria, flank pain, pelvic pain, vaginal bleeding and vaginal pain.  Neurological: Negative for dizziness and headaches.  Psychiatric/Behavioral: Negative.      I have reviewed patient's Past Medical Hx, Surgical Hx, Family Hx, Social Hx, medications and allergies.   Physical Exam   Patient Vitals for the past 24 hrs:  BP Temp Temp src Pulse Height Weight  09/04/17 1929 (!) 146/86 98.9 F (37.2 C) Oral 73 5\' 3"  (1.6 m) 188 lb 12 oz (85.6 kg)   Constitutional: Well-developed, well-nourished female in no acute distress.  Cardiovascular: normal rate Respiratory: normal effort GI: Abd soft, non-tender. Pos BS x 4 MS: Extremities nontender, no edema, normal ROM Neurologic: Alert and oriented x 4.  GU: Neg CVAT.  PELVIC EXAM: Pt self swabbed with wet prep, GCC   LAB RESULTS Results for orders placed or performed during the hospital encounter of 09/04/17 (from the past 24 hour(s))  Urinalysis, Routine w reflex microscopic     Status: Abnormal   Collection Time: 09/04/17  7:31 PM  Result Value Ref Range   Color, Urine  YELLOW YELLOW   APPearance CLEAR CLEAR   Specific Gravity, Urine 1.008 1.005 - 1.030   pH 6.0 5.0 - 8.0   Glucose, UA NEGATIVE NEGATIVE mg/dL   Hgb urine dipstick NEGATIVE NEGATIVE   Bilirubin Urine NEGATIVE NEGATIVE   Ketones, ur 5 (A) NEGATIVE mg/dL   Protein, ur NEGATIVE NEGATIVE mg/dL   Nitrite NEGATIVE NEGATIVE   Leukocytes, UA MODERATE (A) NEGATIVE   RBC / HPF 0-5 0 - 5 RBC/hpf   WBC, UA 0-5 0 - 5 WBC/hpf   Bacteria, UA RARE (A) NONE SEEN   Squamous Epithelial / LPF 0-5 0 - 5   Mucus PRESENT   Wet prep, genital     Status: Abnormal   Collection Time: 09/04/17  7:31 PM  Result Value Ref Range   Yeast Wet Prep HPF POC NONE SEEN NONE SEEN   Trich, Wet Prep NONE SEEN NONE SEEN   Clue Cells Wet Prep HPF POC PRESENT (A) NONE SEEN   WBC, Wet Prep HPF POC MODERATE (A) NONE SEEN   Sperm NONE SEEN   Pregnancy, urine POC     Status: None   Collection Time: 09/04/17  8:35 PM  Result Value Ref Range   Preg Test, Ur NEGATIVE NEGATIVE       IMAGING No results found.  MAU Management/MDM: Ordered labs and reviewed results. Wet prep with clue cells and pt with symptoms c/w BV. Will treat with Flagyl 500 mg BID x 7 days. Pt requests Diflucan because Flagyl sometimes causes yeast infection. Discussed prevention of BV including PO probiotics, decreased use of soaps/perfumes, breathable cotton  Underwear, condoms during intercourse, and use of boric acid suppositories PRN.  Pt to f/u with [facility] office for routine gyn care, return to MAU for emergencies.   ASSESSMENT 1. BV (bacterial vaginosis)     PLAN Discharge home Allergies as of 09/04/2017   No Known Allergies     Medication List    TAKE these medications   fluconazole 150 MG tablet Commonly known as:  DIFLUCAN Take 1 tablet (150 mg total) by mouth once for 1 dose.   metroNIDAZOLE 500 MG tablet Commonly known as:  FLAGYL Take 1 tablet (500 mg total) by mouth 2 (two) times daily.      Follow-up Information    [facility] Follow up.   Specialty:  Obstetrics and Gynecology Why:  Or Ob/Gyn provider of your choice for routine Gyn care. Return to MAU as needed for emergencies. Contact information: [address] [place] [address] [phone]          Sharen Counter Certified Nurse-Midwife 09/04/2017  8:54 PM

## 2017-09-05 LAB — GC/CHLAMYDIA PROBE AMP (~~LOC~~) NOT AT ARMC
Chlamydia: NEGATIVE
Neisseria Gonorrhea: NEGATIVE

## 2017-12-28 ENCOUNTER — Encounter (HOSPITAL_COMMUNITY): Payer: Self-pay | Admitting: *Deleted

## 2017-12-28 ENCOUNTER — Inpatient Hospital Stay (HOSPITAL_COMMUNITY)
Admission: AD | Admit: 2017-12-28 | Discharge: 2017-12-28 | Disposition: A | Discharge status: Home / Self Care | Payer: 59 | Source: Ambulatory Visit | Attending: Obstetrics & Gynecology | Admitting: Obstetrics & Gynecology

## 2017-12-28 DIAGNOSIS — Z202 Contact with and (suspected) exposure to infections with a predominantly sexual mode of transmission: Secondary | ICD-10-CM | POA: Diagnosis not present

## 2017-12-28 DIAGNOSIS — R102 Pelvic and perineal pain: Secondary | ICD-10-CM | POA: Insufficient documentation

## 2017-12-28 LAB — WET PREP, GENITAL
Clue Cells Wet Prep HPF POC: NONE SEEN
Sperm: NONE SEEN
Trich, Wet Prep: NONE SEEN
Yeast Wet Prep HPF POC: NONE SEEN

## 2017-12-28 LAB — POCT PREGNANCY, URINE: Preg Test, Ur: NEGATIVE

## 2017-12-28 MED ORDER — FLUCONAZOLE 150 MG PO TABS: 150.0000 mg | ORAL_TABLET | Freq: Once | ORAL | 0 refills | Status: AC

## 2017-12-28 MED ORDER — AZITHROMYCIN 250 MG PO TABS
1000.0000 mg | ORAL_TABLET | Freq: Once | ORAL | Status: AC
  Administered 2017-12-28: 1000 mg via ORAL
  Filled 2017-12-28: qty 4

## 2017-12-28 MED ORDER — CEFTRIAXONE SODIUM 250 MG IJ SOLR
250.0000 mg | Freq: Once | INTRAMUSCULAR | Status: AC
  Administered 2017-12-28: 250 mg via INTRAMUSCULAR
  Filled 2017-12-28: qty 250

## 2017-12-28 NOTE — MAU Note (Signed)
Pt reports her partner tested positive for chlamydia, pt reports lower abd pain for the last 3-4 days.

## 2017-12-28 NOTE — MAU Provider Note (Signed)
History     CSN: [id number]  Arrival date and time: 12/28/17 1803   First Provider Initiated Contact with Patient 12/28/17 1906      Chief Complaint  Patient presents with  . Exposure to STD  . Pelvic Pain   ADYN HOES is a 27 y.o. [id number] who presents today with concern about exposure to STD. She states that her partner informed her today that he had a + test and was treated today. She denies any vaginal discharge, but has had some mild cramping. She thought her period was about to start.   Exposure to STD   The patient's primary symptoms include pelvic pain. This is a new problem. The current episode started today. The problem has been unchanged. The vaginal discharge was normal. Pertinent negatives include no genital odor. She has tried nothing for the symptoms. Risk factors include history of STDs.  Pelvic Pain  The patient's primary symptoms include pelvic pain. The patient's pertinent negatives include no genital odor. The current episode started in the past 7 days. The problem occurs constantly. The problem has been unchanged. Pain severity now: 2/10. The problem affects both sides. She is not pregnant. Nothing aggravates the symptoms. She has tried nothing for the symptoms. She is sexually active. Yes, her partner has an STD. She uses tubal ligation for contraception. Her menstrual history has been regular (LMP 12/10/17 ).    OB History    Gravida  3   Para  2   Term  2   Preterm  0   AB  1   Living  2     SAB  1   TAB  0   Ectopic  0   Multiple  0   Live Births  2           Past Medical History:  Diagnosis Date  . Anal stricture   . Anemia   . Cholelithiasis   . External hemorrhoids   . Genital condyloma, female   . HPV in female     Past Surgical History:  Procedure Laterality Date  . CESAREAN SECTION  10/21/2010   Procedure: CESAREAN SECTION;  Surgeon: Jessee Avers;  Location: [facility];  Service: Gynecology;  Laterality: N/A;  . CESAREAN  SECTION WITH BILATERAL TUBAL LIGATION Bilateral 02/20/2015   Procedure: CESAREAN SECTION WITH BILATERAL TUBAL LIGATION;  Surgeon: Adam Phenix, MD;  Location: [facility];  Service: Obstetrics;  Laterality: Bilateral;    No family history on file.  Social History   Tobacco Use  . Smoking status: Never Smoker  . Smokeless tobacco: Never Used  Substance Use Topics  . Alcohol use: No  . Drug use: Yes    Types: Marijuana    Allergies: No Known Allergies  Medications Prior to Admission  Medication Sig Dispense Refill Last Dose  . metroNIDAZOLE (FLAGYL) 500 MG tablet Take 1 tablet (500 mg total) by mouth 2 (two) times daily. 14 tablet 3     Review of Systems  Genitourinary: Positive for pelvic pain.   Physical Exam   Blood pressure (!) 144/95, pulse 64, temperature 98.8 F (37.1 C), temperature source Oral, resp. rate 16, height 5\' 3"  (1.6 m), weight 83.5 kg, last menstrual period 12/10/2017, SpO2 100 %.  Physical Exam  Nursing note and vitals reviewed. Constitutional: She is oriented to person, place, and time. She appears well-developed and well-nourished. No distress.  HENT:  Head: Normocephalic.  Cardiovascular: Normal rate.  Respiratory: Effort normal.  GI: Soft.  There is no tenderness.  Neurological: She is alert and oriented to person, place, and time.  Skin: Skin is warm and dry.  Psychiatric: She has a normal mood and affect.     Results for orders placed or performed during the hospital encounter of 12/28/17 (from the past 24 hour(s))  Wet prep, genital     Status: Abnormal   Collection Time: 12/28/17  6:54 PM  Result Value Ref Range   Yeast Wet Prep HPF POC NONE SEEN NONE SEEN   Trich, Wet Prep NONE SEEN NONE SEEN   Clue Cells Wet Prep HPF POC NONE SEEN NONE SEEN   WBC, Wet Prep HPF POC MANY (A) NONE SEEN   Sperm NONE SEEN   Pregnancy, urine POC     Status: None   Collection Time: 12/28/17  7:02 PM  Result Value Ref Range   Preg Test, Ur NEGATIVE NEGATIVE     MAU Course  Procedures  MDM Patient treated here with 250mg  rocephin and 1000mg  azithromycin.  Patient is requesting rx for diflucan as she usually gets yeast after she takes antibiotic.   Assessment and Plan   1. Exposure to sexually transmitted disease (STD)   2. Pelvic pain    DC home Comfort measures reviewed  RX: diflucan as directed #2  Return to MAU as needed   Follow-up Information    [facility] Follow up.   Specialty:  Obstetrics and Gynecology Contact information: [address] [place] [address] [phone]           Thressa Sheller 12/28/2017, 7:07 PM

## 2017-12-28 NOTE — Discharge Instructions (Signed)
In late 2019, the [facility] will be moving to the [facility] campus. At that time, the MAU (Maternity Admissions Unit), where you are being seen today, will no longer take care of non-pregnant patients. We strongly encourage you to find a doctor's office before that time, so that you can be seen with any GYN concerns, like vaginal discharge, urinary tract infection, etc.. in a timely manner.  In order to make an office visit more convenient, the Center for [facility] at [facility] will be offering evening hours with same-day appointments, walk-in appointments and scheduled appointments available during this time.  Center for [facility] @ [facility] Hours: Monday - 8am - 7:30 pm with walk-in between 4pm- 7:30 pm Tuesday - 8 am - 5 pm (starting 06/27/17 we will be open late and accepting walk-ins from 4pm - 7:30pm) Wednesday - 8 am - 5 pm (starting 09/27/17 we will be open late and accepting walk-ins from 4pm - 7:30pm) Thursday 8 am - 5 pm (starting 12/28/17 we will be open late and accepting walk-ins from 4pm - 7:30pm) Friday 8 am - 5 pm  For an appointment please call the Center for [facility] @ [facility] at [phone]  For urgent needs, [facility] is also available for management of urgent GYN complaints such as vaginal discharge or urinary tract infections.   Chlamydia, Female Chlamydia is an STD (sexually transmitted disease). This is an infection that spreads through sexual contact. If it is not treated, it can cause serious problems. It must be treated with antibiotic medicine. Sometimes, you may not have symptoms (asymptomatic). When you have symptoms, they can include:  Burning when you pee (urinate).  Peeing often.  Fluid (discharge) coming from the vagina.  Redness, soreness, and swelling (inflammation) of the butt (rectum).  Bleeding or fluid coming from the butt.  Belly (abdominal) pain.  Pain during  sex.  Bleeding between periods.  Itching, burning, or redness in the eyes.  Fluid coming from the eyes.  Follow these instructions at home: Medicines  Take over-the-counter and prescription medicines only as told by your doctor.  Take your antibiotic medicine as told by your doctor. Do not stop taking the antibiotic even if you start to feel better. Sexual activity  Tell sex partners about your infection. Sex partners are people you had oral, anal, or vaginal sex with within 60 days of when you started getting sick. They need treatment, too.  Do not have sex until: ? You and your sex partners have been treated. ? Your doctor says it is okay.  If you have a single dose treatment, wait 7 days before having sex. General instructions  It is up to you to get your test results. Ask your doctor when your results will be ready.  Get a lot of rest.  Eat healthy foods.  Drink enough fluid to keep your pee (urine) clear or pale yellow.  Keep all follow-up visits as told by your doctor. You may need tests after 3 months. Preventing chlamydia  The only way to prevent chlamydia is not to have sex. To lower your risk: ? Use latex condoms correctly. Do this every time you have sex. ? Avoid having many sex partners. ? Ask if your partner has been tested for STDs and if he or she had negative results. Contact a doctor if:  You get new symptoms.  You do not get better with treatment.  You have a fever or chills.  You  have pain during sex. Get help right away if:  Your pain gets worse and does not get better with medicine.  You get flu-like symptoms, such as: ? Night sweats. ? Sore throat. ? Muscle aches.  You feel sick to your stomach (nauseous).  You throw up (vomit).  You have trouble swallowing.  You have bleeding: ? Between periods. ? After sex.  You have irregular periods.  You have belly pain that does not get better with medicine.  You have lower back pain  that does not get better with medicine.  You feel weak or dizzy.  You pass out (faint).  You are pregnant and you get symptoms of chlamydia. Summary  Chlamydia is an infection that spreads through sexual contact.  Sometimes, chlamydia can cause no symptoms (asymptomatic).  Do not have sex until your doctor says it is okay.  All sex partners will have to be treated for chlamydia. This information is not intended to replace advice given to you by your health care provider. Make sure you discuss any questions you have with your health care provider. Document Released: 12/22/2007 Document Revised: 03/03/2016 Document Reviewed: 03/03/2016 Elsevier Interactive Patient Education  2017 [employer].

## 2017-12-28 NOTE — MAU Note (Signed)
Urine in lab 

## 2017-12-29 LAB — GC/CHLAMYDIA PROBE AMP (~~LOC~~) NOT AT ARMC
Chlamydia: POSITIVE — AB
Neisseria Gonorrhea: NEGATIVE

## 2018-05-01 ENCOUNTER — Ambulatory Visit (HOSPITAL_COMMUNITY)
Admission: EM | Admit: 2018-05-01 | Discharge: 2018-05-01 | Disposition: A | Discharge status: Home / Self Care | Payer: 59 | Attending: Internal Medicine | Admitting: Internal Medicine

## 2018-05-01 ENCOUNTER — Encounter (HOSPITAL_COMMUNITY): Payer: Self-pay | Admitting: Emergency Medicine

## 2018-05-01 DIAGNOSIS — L0291 Cutaneous abscess, unspecified: Secondary | ICD-10-CM

## 2018-05-01 MED ORDER — CEPHALEXIN 250 MG PO CAPS: 250.0000 mg | ORAL_CAPSULE | Freq: Three times a day (TID) | ORAL | 0 refills | Status: AC

## 2018-05-01 MED ORDER — FLUCONAZOLE 150 MG PO TABS: 150.0000 mg | ORAL_TABLET | Freq: Every day | ORAL | 0 refills | Status: DC

## 2018-05-01 NOTE — ED Provider Notes (Signed)
[facility]    CSN: [id number] Arrival date & time: 05/01/18  1827     History   Chief Complaint Chief Complaint  Patient presents with  . Abscess    HPI Amber Blanchard is a 28 y.o. female.   28 year old female with no chronic medical problems presents to urgent care complaining of an abscess that developed within the last 3 days.  The patient was able to express some pus earlier today but is concerned that the area is more painful and has grown in size.  It is warm and red although the patient denies fevers.  She also denies nausea or vomiting.     Past Medical History:  Diagnosis Date  . Anal stricture   . Anemia   . Cholelithiasis   . External hemorrhoids   . Genital condyloma, female   . HPV in female     Patient Active Problem List   Diagnosis Date Noted  . Marijuana use 10/28/2014  . History of cesarean section 10/28/2014  . Condyloma of female genitalia 01/07/2014    Past Surgical History:  Procedure Laterality Date  . CESAREAN SECTION  10/21/2010   Procedure: CESAREAN SECTION;  Surgeon: Jessee Aversara J. Cole;  Location: [facility];  Service: Gynecology;  Laterality: N/A;  . CESAREAN SECTION WITH BILATERAL TUBAL LIGATION Bilateral 02/20/2015   Procedure: CESAREAN SECTION WITH BILATERAL TUBAL LIGATION;  Surgeon: Adam PhenixJames G Arnold, MD;  Location: [facility];  Service: Obstetrics;  Laterality: Bilateral;    OB History    Gravida  3   Para  2   Term  2   Preterm  0   AB  1   Living  2     SAB  1   TAB  0   Ectopic  0   Multiple  0   Live Births  2            Home Medications    Prior to Admission medications   Medication Sig Start Date End Date Taking? Authorizing Provider  cephALEXin (KEFLEX) 250 MG capsule Take 1 capsule (250 mg total) by mouth 3 (three) times daily for 7 days. 05/01/18 05/08/18  Arnaldo Nataliamond, Dominie Benedick S, MD  fluconazole (DIFLUCAN) 150 MG tablet Take 1 tablet (150 mg total) by mouth daily. Take at the end of your course of Keflex  05/01/18   Arnaldo Nataliamond, Kymari Nuon S, MD  metroNIDAZOLE (FLAGYL) 500 MG tablet Take 1 tablet (500 mg total) by mouth 2 (two) times daily. Patient not taking: Reported on 05/01/2018 09/04/17   Hurshel PartyLeftwich-Kirby, Lisa A, CNM    Family History No family history on file.  Social History Social History   Tobacco Use  . Smoking status: Never Smoker  . Smokeless tobacco: Never Used  Substance Use Topics  . Alcohol use: No  . Drug use: Yes    Types: Marijuana     Allergies   Patient has no known allergies.   Review of Systems Review of Systems  Constitutional: Negative for chills and fever.  HENT: Negative for sore throat and tinnitus.   Eyes: Negative for redness.  Respiratory: Negative for cough and shortness of breath.   Cardiovascular: Negative for chest pain and palpitations.  Gastrointestinal: Negative for abdominal pain, diarrhea, nausea and vomiting.  Genitourinary: Negative for dysuria, frequency and urgency.  Musculoskeletal: Negative for myalgias.  Skin: Negative for rash.       See HPI  Neurological: Negative for weakness.  Hematological: Does not bruise/bleed easily.  Psychiatric/Behavioral: Negative for  suicidal ideas.     Physical Exam Triage Vital Signs ED Triage Vitals  Enc Vitals Group     BP 05/01/18 1920 (!) 159/82     Pulse Rate 05/01/18 1920 73     Resp 05/01/18 1920 18     Temp 05/01/18 1920 98.6 F (37 C)     Temp src --      SpO2 05/01/18 1920 100 %     Weight --      Height --      Head Circumference --      Peak Flow --      Pain Score 05/01/18 1921 6     Pain Loc --      Pain Edu? --      Excl. in GC? --    No data found.  Updated Vital Signs BP (!) 159/82   Pulse 73   Temp 98.6 F (37 C)   Resp 18   LMP 04/30/2018   SpO2 100%   Visual Acuity Right Eye Distance:   Left Eye Distance:   Bilateral Distance:    Right Eye Near:   Left Eye Near:    Bilateral Near:     Physical Exam Vitals signs and nursing note reviewed.    Constitutional:      General: She is not in acute distress.    Appearance: She is well-developed.  HENT:     Head: Normocephalic and atraumatic.  Eyes:     General: No scleral icterus.    Conjunctiva/sclera: Conjunctivae normal.     Pupils: Pupils are equal, round, and reactive to light.  Neck:     Musculoskeletal: Normal range of motion and neck supple.     Thyroid: No thyromegaly.     Vascular: No JVD.     Trachea: No tracheal deviation.  Cardiovascular:     Rate and Rhythm: Normal rate and regular rhythm.     Heart sounds: Normal heart sounds. No murmur. No friction rub. No gallop.   Pulmonary:     Effort: Pulmonary effort is normal.     Breath sounds: Normal breath sounds.  Abdominal:     General: Bowel sounds are normal. There is no distension.     Palpations: Abdomen is soft.     Tenderness: There is no abdominal tenderness.  Musculoskeletal: Normal range of motion.  Lymphadenopathy:     Cervical: No cervical adenopathy.  Skin:    General: Skin is warm and dry.     Comments: Warm red area of fluctuance approximately 2 cm in diameter on posterior left thigh with area of surrounding induration and erythema that is nonfluctuant measuring approximately 1 cm beyond either side of the fluctuant area for total area of swelling approximately 4 cm in diameter  Neurological:     Mental Status: She is alert and oriented to person, place, and time.     Cranial Nerves: No cranial nerve deficit.  Psychiatric:        Behavior: Behavior normal.        Thought Content: Thought content normal.        Judgment: Judgment normal.      UC Treatments / Results  Labs (all labs ordered are listed, but only abnormal results are displayed) Labs Reviewed - No data to display  EKG None  Radiology No results found.  Procedures Incision and Drainage Date/Time: 05/01/2018 8:10 PM Performed by: Arnaldo Natal, MD Authorized by: Arnaldo Natal, MD   Consent:  Consent  obtained:  Verbal   Consent given by:  Patient   Risks discussed:  Bleeding and pain   Alternatives discussed:  Delayed treatment Location:    Type:  Abscess   Location:  Lower extremity   Lower extremity location:  Leg   Leg location:  L upper leg Pre-procedure details:    Skin preparation:  Betadine Anesthesia (see MAR for exact dosages):    Anesthesia method:  Local infiltration   Local anesthetic:  Lidocaine 2% w/o epi Procedure type:    Complexity:  Simple Procedure details:    Needle aspiration: no     Incision types:  Stab incision   Incision depth:  Dermal   Scalpel blade:  15   Wound management:  Probed and deloculated   Drainage:  Purulent and bloody   Drainage amount:  Moderate   Wound treatment:  Wound left open   Packing materials:  None Post-procedure details:    Patient tolerance of procedure:  Tolerated well, no immediate complications   (including critical care time)  Medications Ordered in UC Medications - No data to display  Initial Impression / Assessment and Plan / UC Course  I have reviewed the triage vital signs and the nursing notes.  Pertinent labs & imaging results that were available during my care of the patient were reviewed by me and considered in my medical decision making (see chart for details).     Abscess lanced with approximately 5 cc of purulent and bloody drainage expressed.  Patient tolerated procedure well.  No complications.  Prescribed antibiotics  Final Clinical Impressions(s) / UC Diagnoses   Final diagnoses:  Abscess   Discharge Instructions   None    ED Prescriptions    Medication Sig Dispense Auth. Provider   cephALEXin (KEFLEX) 250 MG capsule Take 1 capsule (250 mg total) by mouth 3 (three) times daily for 7 days. 21 capsule Arnaldo Nataliamond, Shelly Shoultz S, MD   fluconazole (DIFLUCAN) 150 MG tablet Take 1 tablet (150 mg total) by mouth daily. Take at the end of your course of Keflex 1 tablet Arnaldo Nataliamond, Sueann Brownley S, MD      Controlled Substance Prescriptions [place] Controlled Substance Registry consulted? Not Applicable   Arnaldo Nataliamond, Mylo Choi S, MD 05/01/18 2015

## 2018-05-01 NOTE — ED Triage Notes (Signed)
Pt c/o abscess on the back of her L thigh, states its been going on for 3 days, states she squeezed some of it out. Pt requesting to NOT lance it and only take antibiotics at this time.

## 2018-10-18 ENCOUNTER — Emergency Department (HOSPITAL_COMMUNITY)
Admission: EM | Admit: 2018-10-18 | Discharge: 2018-10-18 | Disposition: A | Discharge status: Home / Self Care | Payer: Self-pay | Attending: Emergency Medicine | Admitting: Emergency Medicine

## 2018-10-18 ENCOUNTER — Emergency Department (HOSPITAL_COMMUNITY): Payer: Self-pay

## 2018-10-18 ENCOUNTER — Other Ambulatory Visit: Payer: Self-pay

## 2018-10-18 DIAGNOSIS — F1210 Cannabis abuse, uncomplicated: Secondary | ICD-10-CM | POA: Insufficient documentation

## 2018-10-18 DIAGNOSIS — M25561 Pain in right knee: Secondary | ICD-10-CM | POA: Insufficient documentation

## 2018-10-18 DIAGNOSIS — R2241 Localized swelling, mass and lump, right lower limb: Secondary | ICD-10-CM | POA: Insufficient documentation

## 2018-10-18 MED ORDER — NAPROXEN 500 MG PO TABS: 500.0000 mg | ORAL_TABLET | Freq: Two times a day (BID) | ORAL | 0 refills | Status: DC

## 2018-10-18 NOTE — ED Notes (Signed)
Pt transported to xray 

## 2018-10-18 NOTE — Discharge Instructions (Addendum)
Make sure to ice, elevate, take Tylenol and ibuprofen for your pain.  Follow-up with orthopedics if you continue to have pain.

## 2018-10-18 NOTE — ED Provider Notes (Signed)
[facility] Provider Note   CSN: [id number] Arrival date & time: 10/18/18  1617   History   Chief Complaint Chief Complaint  Patient presents with   Knee Pain    HPI Amber Blanchard is a 28 y.o. female with past medical history significant for marijuana use who presents for evaluation of knee pain.  Patient states 3 days PTA she was standing when she "stepped wrong" in her shoes which caused right lateral knee pain.  Patient states she has been able to walk however has pain to her knee.  Has been elevating and icing this without relief of her pain.  Patient states she feels like she has mild swelling to the right side of her knee since the injury.  She denies falling on her knee, hitting her head or LOC.  Is not followed by orthopedics.  Rates her current pain a 4/10.  Pain located to right lateral joint line on knee.  Ambulatory without difficulty.  Denies fever, chills, decreased range of motion, numbness or tingling, redness, swelling, warmth to her extremities.  No dysuria, vaginal discharge, no history of gonorrhea.  Denies additional aggravating or alleviating factors  History obtained from patient and past medical records.  No interpreter was used.     HPI  Past Medical History:  Diagnosis Date   Anal stricture    Anemia    Cholelithiasis    External hemorrhoids    Genital condyloma, female    HPV in female     Patient Active Problem List   Diagnosis Date Noted   Marijuana use 10/28/2014   History of cesarean section 10/28/2014   Condyloma of female genitalia 01/07/2014    Past Surgical History:  Procedure Laterality Date   CESAREAN SECTION  10/21/2010   Procedure: CESAREAN SECTION;  Surgeon: Catha Brow;  Location: [place] [facility];  Service: Gynecology;  Laterality: N/A;   CESAREAN SECTION WITH BILATERAL TUBAL LIGATION Bilateral 02/20/2015   Procedure: CESAREAN SECTION WITH BILATERAL TUBAL LIGATION;  Surgeon: Woodroe Mode, MD;  Location: [place] [facility];  Service: Obstetrics;  Laterality: Bilateral;     OB History    Gravida  3   Para  2   Term  2   Preterm  0   AB  1   Living  2     SAB  1   TAB  0   Ectopic  0   Multiple  0   Live Births  2            Home Medications    Prior to Admission medications   Medication Sig Start Date End Date Taking? Authorizing Provider  naproxen (NAPROSYN) 500 MG tablet Take 1 tablet (500 mg total) by mouth 2 (two) times daily. 10/18/18   Mardell Suttles A, PA-C    Family History No family history on file.  Social History Social History   Tobacco Use   Smoking status: Never Smoker   Smokeless tobacco: Never Used  Substance Use Topics   Alcohol use: No   Drug use: Yes    Types: Marijuana     Allergies   Patient has no known allergies.   Review of Systems Review of Systems  Constitutional: Negative.   HENT: Negative.   Respiratory: Negative.   Cardiovascular: Negative.   Gastrointestinal: Negative.   Musculoskeletal: Negative for gait problem.       Right knee pain  Skin: Negative.   Neurological: Negative.   All other  systems reviewed and are negative.    Physical Exam Updated Vital Signs BP (!) 146/82    Pulse (!) 104    Temp 99.4 F (37.4 C) (Oral)    Resp 16    SpO2 97%   Physical Exam Vitals signs and nursing note reviewed.  Constitutional:      General: She is not in acute distress.    Appearance: She is well-developed. She is not ill-appearing, toxic-appearing or diaphoretic.  HENT:     Head: Atraumatic.  Eyes:     Pupils: Pupils are equal, round, and reactive to light.  Neck:     Musculoskeletal: Normal range of motion.  Cardiovascular:     Rate and Rhythm: Normal rate.  Pulmonary:     Effort: No respiratory distress.  Abdominal:     General: There is no distension.  Musculoskeletal: Normal range of motion.     Right hip: Normal.     Left hip: Normal.     Right knee: She exhibits normal range of  motion, no swelling, no effusion, no ecchymosis, no deformity, no laceration, no erythema, normal alignment, no LCL laxity, normal patellar mobility, no bony tenderness, normal meniscus and no MCL laxity. Tenderness found. Lateral joint line tenderness noted.     Left knee: Normal.     Right ankle: Normal.     Left ankle: Normal.     Lumbar back: Normal.     Right lower leg: Normal.     Left lower leg: Normal.     Right foot: Normal.     Left foot: Normal.     Comments: Tenderness palpation to right lateral joint line.  Negative varus, valgus stress.  Full range of motion with flexion, extension without difficulty.  No obvious swelling, redness or warmth.  No bony tenderness.  Able to straight leg raise without difficulty.  Lower extremity compartments are soft.  2+ DP, PT pulses bilaterally.  Negative anterior drawer bilateral knees.  No tenderness to femur, tibia/fibula bilaterally  Skin:    General: Skin is warm and dry.     Comments: No edema, erythema, ecchymosis or warmth.  Nontender calves bilaterally.  Homans sign negative.  No rashes or lesions.  Brisk capillary refill.  Neurological:     Mental Status: She is alert.     Comments: 5/5 strength bilateral lower extremities without difficulty.  Ambulatory without difficulty.    ED Treatments / Results  Labs (all labs ordered are listed, but only abnormal results are displayed) Labs Reviewed - No data to display  EKG None  Radiology Dg Knee Complete 4 Views Right  Result Date: 10/18/2018 CLINICAL DATA:  Knee pain EXAM: RIGHT KNEE - COMPLETE 4+ VIEW COMPARISON:  None FINDINGS: Osseous mineralization normal. Joint spaces preserved. Probable small joint effusion. No acute fracture, dislocation, or bone destruction. IMPRESSION: Probable joint effusion. No acute osseous abnormalities. Electronically Signed   By: Lavonia Dana M.D.   On: 10/18/2018 16:50    Procedures Procedures (including critical care time)  Medications Ordered in  ED Medications - No data to display   Initial Impression / Assessment and Plan / ED Course  I have reviewed the triage vital signs and the nursing notes.  Pertinent labs & imaging results that were available during my care of the patient were reviewed by me and considered in my medical decision making (see chart for details).  28 year old female appears otherwise well presents for evaluation of knee pain.  Afebrile, nonseptic, non-ill-appearing.  Patient with right  lateral joint line tenderness after twisting of her knee approximately 4 days PTA.  She has been ambulatory without difficulty.  Neuromusculoskeletal exam.  She is neurovascularly intact.  She has no overlying skin changes.  Compartments soft.  No evidence of infectious process on exam.  Negative varus, valgus stress, anterior drawer.  Homans sign negative.  No evidence of DVT.  No tenderness to femur, tibia/fibula.  Pelvis stable and nontender to palpation.  Plain film negative for fracture, dislocation.  Possible small joint effusion.  Low suspicion for septic joint, gout, hemarthrosis, disseminated gonorrhea, fracture, dislocation, compartment syndrome, DVT, infectious process.  Will place patient in knee sleeve.  She declines crutches.  Discussed RICE for symptomatic management.  She to follow-up with orthopedics if she continues to have pain. Discussed strict return precautions.  Patient voiced understanding is agreeable follow-up.       Final Clinical Impressions(s) / ED Diagnoses   Final diagnoses:  Acute pain of right knee    ED Discharge Orders         Ordered    naproxen (NAPROSYN) 500 MG tablet  2 times daily     10/18/18 1754           Cleotilde Spadaccini A, PA-C 10/18/18 1757    Virgel Manifold, MD 10/18/18 1842

## 2018-10-18 NOTE — ED Triage Notes (Addendum)
Pt reports R knee pain starting Sunday. Pt reports she thinks she stepped wrong in her shoes and hurt her knee, denies falling.

## 2018-10-18 NOTE — ED Notes (Signed)
Patient transported to X-ray 

## 2018-10-18 NOTE — ED Notes (Signed)
Discharge instructions and prescription discussed with Pt. Pt verbalized understanding. Pt stable and ambulatory.    

## 2018-12-17 ENCOUNTER — Encounter: Payer: Self-pay | Admitting: Gynecology

## 2018-12-31 ENCOUNTER — Other Ambulatory Visit: Payer: Self-pay

## 2018-12-31 ENCOUNTER — Ambulatory Visit (INDEPENDENT_AMBULATORY_CARE_PROVIDER_SITE_OTHER): Payer: Self-pay | Admitting: *Deleted

## 2018-12-31 VITALS — BP 154/87 | HR 75 | Temp 98.5°F | Ht 63.0 in | Wt 192.5 lb

## 2018-12-31 DIAGNOSIS — B9689 Other specified bacterial agents as the cause of diseases classified elsewhere: Secondary | ICD-10-CM

## 2018-12-31 DIAGNOSIS — N760 Acute vaginitis: Secondary | ICD-10-CM

## 2018-12-31 DIAGNOSIS — N898 Other specified noninflammatory disorders of vagina: Secondary | ICD-10-CM

## 2018-12-31 DIAGNOSIS — Z113 Encounter for screening for infections with a predominantly sexual mode of transmission: Secondary | ICD-10-CM

## 2018-12-31 MED ORDER — FLUCONAZOLE 150 MG PO TABS: 150.0000 mg | ORAL_TABLET | Freq: Once | ORAL | 0 refills | Status: AC

## 2018-12-31 MED ORDER — METRONIDAZOLE 500 MG PO TABS: 500.0000 mg | ORAL_TABLET | Freq: Two times a day (BID) | ORAL | 0 refills | Status: DC

## 2018-12-31 NOTE — Progress Notes (Signed)
I have reviewed this chart and agree with the RN/CMA assessment and management.   Add Dinapoli, MD OB Family Medicine Fellow, [facility], [facility]  

## 2018-12-31 NOTE — Progress Notes (Signed)
Pt states she noticed increased vaginal discharge with odor starting 2 days ago. She has had BV in the past and believes that is the problem. Pt also reports she typically gets yeast infection following Rx to treat BV. Self swab obtained. Pt will be notified of results via [facility]. Flagyl and Diflucan e-prescribed per standing order. Pt advised that she can send MyChart message in the future if she develops these sx again and may not need in office appointment. Pt also advised that her BP is elevated and she should follow up with PCP. Pt states she does not have PCP and does not have insurance. I recommended she can contact [facility] or [facility] clinic. Pt voiced understanding of all information and instructions given.

## 2019-01-07 ENCOUNTER — Telehealth: Payer: Self-pay | Admitting: Family Medicine

## 2019-01-07 NOTE — Telephone Encounter (Signed)
Patient calling to get test results when she was seen on 12-31-2018.

## 2019-01-08 LAB — CERVICOVAGINAL ANCILLARY ONLY
Bacterial Vaginitis (gardnerella): POSITIVE — AB
Candida Glabrata: NEGATIVE
Candida Vaginitis: NEGATIVE
Chlamydia: NEGATIVE
Comment: NEGATIVE
Comment: NEGATIVE
Comment: NEGATIVE
Comment: NEGATIVE
Neisseria Gonorrhea: NEGATIVE
Trichomonas: NEGATIVE

## 2019-01-08 NOTE — Telephone Encounter (Signed)
Called Cytology and results were released. Pt + for BV.   Called and spoke with pt at 09:55 to let her know results are back and positive for BV.   Sent My Chart message. Pt was previously prescribed Flagyl for tx.

## 2019-02-08 ENCOUNTER — Encounter: Payer: Self-pay | Admitting: Physician Assistant

## 2019-02-08 ENCOUNTER — Telehealth: Payer: Self-pay | Admitting: Physician Assistant

## 2019-02-08 DIAGNOSIS — M549 Dorsalgia, unspecified: Secondary | ICD-10-CM

## 2019-02-08 DIAGNOSIS — N898 Other specified noninflammatory disorders of vagina: Secondary | ICD-10-CM

## 2019-02-08 NOTE — Progress Notes (Signed)
Based on what you shared with me, I feel your condition warrants further evaluation and I recommend that you be seen for a face to face office visit.  Ms. Steller,  Dennis Bast stated that you have recently been treated for your vaginal discharge. It is unclear if the discharge has resolved with the treatment or if the medication hasnt worked. Please follow up with your doctor for further workup of your discharge.   NOTE: If you entered your credit card information for this eVisit, you will not be charged. You may see a "hold" on your card for the $35 but that hold will drop off and you will not have a charge processed.  If you are having a true medical emergency please call 911.     For an urgent face to face visit, [facility] has four urgent care centers for your convenience:    NEW:  [facility]   https://www.google.com/maps/dir/?[email or web address]&destination=[facility]%2c+[address]+[place]%2c+[place]+[address]                                                   [place], [place] [address] .  Monday - Friday 10 am - 6 pm    . [facility]    [phone]                  Get Driving Directions  [address] [place], [place] [address] . 10 am to 8 pm Monday-Friday . 12 pm to 8 pm Saturday-Sunday   . [facility] [place]                  Get Driving Directions  [address], [place], [place] [address] . 8 am to 8 pm Monday-Friday . 9 am to 6 pm Saturday . 11 am to 6 pm Sunday     . [facility] [place]                  Get Driving Directions   [address] [place],  [address] . 8 am to 8 pm Monday-Friday . 8 am to 4 pm Saturday-Sunday    . [facility] [place]                    Get Driving Directions  [phone]  [address], [place],  [address]  . Monday-Friday, 12 PM to 6 PM    Your e-visit answers were reviewed by a board certified advanced clinical practitioner to complete your personal care plan.  Thank you for using e-Visits. I spent 5-10 minutes on review and completion of this note- Lacy Duverney [facility]

## 2019-02-18 ENCOUNTER — Ambulatory Visit (INDEPENDENT_AMBULATORY_CARE_PROVIDER_SITE_OTHER): Payer: Self-pay | Admitting: *Deleted

## 2019-02-18 ENCOUNTER — Other Ambulatory Visit: Payer: Self-pay

## 2019-02-18 DIAGNOSIS — B9689 Other specified bacterial agents as the cause of diseases classified elsewhere: Secondary | ICD-10-CM

## 2019-02-18 DIAGNOSIS — Z113 Encounter for screening for infections with a predominantly sexual mode of transmission: Secondary | ICD-10-CM

## 2019-02-18 DIAGNOSIS — B373 Candidiasis of vulva and vagina: Secondary | ICD-10-CM

## 2019-02-18 DIAGNOSIS — N760 Acute vaginitis: Secondary | ICD-10-CM

## 2019-02-18 DIAGNOSIS — N898 Other specified noninflammatory disorders of vagina: Secondary | ICD-10-CM

## 2019-02-18 MED ORDER — FLUCONAZOLE 150 MG PO TABS: 150.0000 mg | ORAL_TABLET | Freq: Once | ORAL | 0 refills | Status: AC

## 2019-02-18 MED ORDER — METRONIDAZOLE 500 MG PO TABS: 500.0000 mg | ORAL_TABLET | Freq: Two times a day (BID) | ORAL | 0 refills | Status: DC

## 2019-02-18 NOTE — Progress Notes (Signed)
Having white d/c with odor of BV. Pt states just had BV. Uses tampons and same partner. Takes bubble baths often and encouraged not to use bubble soln in baths. States symptoms are just like she usually has with BV. Instucted in self swab and will send in script for med as she does not want to wait for results. Just finished her cycle as well. Pt request Diflucan be prescribed as she usually gets yeast after antibiotics. Flagyl and Diflucan scripts sent. Pt will scheduled appt for annual exam and plans to discuss recurrent BV with provider at that time.

## 2019-02-20 LAB — CERVICOVAGINAL ANCILLARY ONLY
Bacterial Vaginitis (gardnerella): POSITIVE — AB
Candida Glabrata: NEGATIVE
Candida Vaginitis: POSITIVE — AB
Chlamydia: POSITIVE — AB
Comment: NEGATIVE
Comment: NEGATIVE
Comment: NEGATIVE
Comment: NEGATIVE
Comment: NEGATIVE
Comment: NORMAL
Neisseria Gonorrhea: NEGATIVE
Trichomonas: NEGATIVE

## 2019-02-21 ENCOUNTER — Other Ambulatory Visit: Payer: Self-pay | Admitting: Family Medicine

## 2019-02-21 MED ORDER — AZITHROMYCIN 500 MG PO TABS: 1000.0000 mg | ORAL_TABLET | Freq: Once | ORAL | 0 refills | Status: AC

## 2019-02-21 NOTE — Progress Notes (Addendum)
Chart reviewed for nurse visit. Agree with plan of care.   Rona Tomson M, MD  

## 2019-02-21 NOTE — Progress Notes (Signed)
Rx Azithro for chlamydia, rx for bv/yeast already sent previously. Will send message to clinical pool to offer EPT.

## 2019-02-25 ENCOUNTER — Other Ambulatory Visit: Payer: Self-pay | Admitting: *Deleted

## 2019-02-25 ENCOUNTER — Encounter: Payer: Self-pay | Admitting: *Deleted

## 2019-02-25 ENCOUNTER — Telehealth: Payer: Self-pay | Admitting: *Deleted

## 2019-02-25 DIAGNOSIS — Z202 Contact with and (suspected) exposure to infections with a predominantly sexual mode of transmission: Secondary | ICD-10-CM

## 2019-02-25 MED ORDER — AZITHROMYCIN 250 MG PO TABS: 1000.0000 mg | ORAL_TABLET | Freq: Once | ORAL | Status: DC

## 2019-02-25 NOTE — Telephone Encounter (Addendum)
Called pt per request of Dr. Dione Plover to offer treatment for her partner in light of recent +Chlamydia test result. I left a VM message stating that I will send a secure MyChart message regarding the reason for my call. STI form completed and faxed to [facility].

## 2019-02-27 ENCOUNTER — Ambulatory Visit (INDEPENDENT_AMBULATORY_CARE_PROVIDER_SITE_OTHER): Payer: Self-pay | Admitting: Otolaryngology

## 2019-03-14 ENCOUNTER — Encounter: Payer: Self-pay | Admitting: *Deleted

## 2019-04-04 ENCOUNTER — Other Ambulatory Visit: Payer: Self-pay

## 2019-04-04 ENCOUNTER — Encounter (HOSPITAL_COMMUNITY): Payer: Self-pay | Admitting: Emergency Medicine

## 2019-04-04 ENCOUNTER — Emergency Department (HOSPITAL_COMMUNITY)
Admission: EM | Admit: 2019-04-04 | Discharge: 2019-04-04 | Disposition: A | Discharge status: Home / Self Care | Payer: 59 | Attending: Emergency Medicine | Admitting: Emergency Medicine

## 2019-04-04 ENCOUNTER — Emergency Department (HOSPITAL_BASED_OUTPATIENT_CLINIC_OR_DEPARTMENT_OTHER): Payer: 59

## 2019-04-04 DIAGNOSIS — M79609 Pain in unspecified limb: Secondary | ICD-10-CM

## 2019-04-04 DIAGNOSIS — I10 Essential (primary) hypertension: Secondary | ICD-10-CM

## 2019-04-04 DIAGNOSIS — M7989 Other specified soft tissue disorders: Secondary | ICD-10-CM | POA: Insufficient documentation

## 2019-04-04 DIAGNOSIS — R600 Localized edema: Secondary | ICD-10-CM | POA: Diagnosis present

## 2019-04-04 NOTE — ED Triage Notes (Signed)
Pt reports 3 days of right lower leg pain feels like a constant "charile horse" and it swollen.

## 2019-04-04 NOTE — Progress Notes (Signed)
Right lower extremity venous duplex completed. Refer to "CV Proc" under chart review to view preliminary results.  04/04/2019 5:32 PM Kelby Aline., MHA, RVT, RDCS, RDMS

## 2019-04-04 NOTE — ED Provider Notes (Signed)
[facility] EMERGENCY DEPARTMENT Provider Note   CSN: [id number] Arrival date & time: 04/04/19  1646     History Chief Complaint  Patient presents with  . Leg Swelling    Amber Blanchard is a 29 y.o. female with no significant pertinent past medical history who presents today for evaluation of right lower leg swelling and pain.  She reports that over the past 3 days it is felt like a charley horse and has been swollen.  She states that she had a similar thing approximately 6 months ago.  She denies any trauma.  No recent fevers or wounds.  She has not tried any interventions prior to arrival.  She reports when this had previously happened she had used a knee brace that she was prescribed without significant relief.  She does not take any hormones or birth control.  She does not have a history of prior VTE.  HPI     Past Medical History:  Diagnosis Date  . Anal stricture   . Anemia   . Cholelithiasis   . External hemorrhoids   . Genital condyloma, female   . HPV in female     Patient Active Problem List   Diagnosis Date Noted  . Marijuana use 10/28/2014  . History of cesarean section 10/28/2014  . Condyloma of female genitalia 01/07/2014    Past Surgical History:  Procedure Laterality Date  . CESAREAN SECTION  10/21/2010   Procedure: CESAREAN SECTION;  Surgeon: Catha Brow;  Location: [facility];  Service: Gynecology;  Laterality: N/A;  . CESAREAN SECTION WITH BILATERAL TUBAL LIGATION Bilateral 02/20/2015   Procedure: CESAREAN SECTION WITH BILATERAL TUBAL LIGATION;  Surgeon: Woodroe Mode, MD;  Location: [facility];  Service: Obstetrics;  Laterality: Bilateral;     OB History    Gravida  3   Para  2   Term  2   Preterm  0   AB  1   Living  2     SAB  1   TAB  0   Ectopic  0   Multiple  0   Live Births  2           No family history on file.  Social History   Tobacco Use  . Smoking status: Never Smoker  . Smokeless tobacco: Never  Used  Substance Use Topics  . Alcohol use: No  . Drug use: Yes    Types: Marijuana    Home Medications Prior to Admission medications   Medication Sig Start Date End Date Taking? Authorizing Provider  metroNIDAZOLE (FLAGYL) 500 MG tablet Take 1 tablet (500 mg total) by mouth 2 (two) times daily. 02/18/19   Clarnce Flock, MD  naproxen (NAPROSYN) 500 MG tablet Take 1 tablet (500 mg total) by mouth 2 (two) times daily. 10/18/18   Henderly, Britni A, PA-C    Allergies    Patient has no known allergies.  Review of Systems   Review of Systems  Constitutional: Negative for chills and fever.  Respiratory: Negative for cough, chest tightness and shortness of breath.   Gastrointestinal: Negative for abdominal pain.  Genitourinary: Negative for dysuria.  All other systems reviewed and are negative.   Physical Exam Updated Vital Signs BP (!) 148/91 (BP Location: Left Arm)   Pulse 80   Temp 99 F (37.2 C) (Oral)   Resp 14   SpO2 100%   Physical Exam Vitals and nursing note reviewed.  Constitutional:  General: She is not in acute distress.    Appearance: She is well-developed. She is not diaphoretic.  HENT:     Head: Normocephalic and atraumatic.  Eyes:     General: No scleral icterus.       Right eye: No discharge.        Left eye: No discharge.     Conjunctiva/sclera: Conjunctivae normal.  Cardiovascular:     Rate and Rhythm: Normal rate and regular rhythm.     Pulses: Normal pulses.  Pulmonary:     Effort: Pulmonary effort is normal. No respiratory distress.     Breath sounds: No stridor.  Abdominal:     General: There is no distension.  Musculoskeletal:        General: No deformity.     Cervical back: Normal range of motion.     Right lower leg: Edema present.  Skin:    General: Skin is warm and dry.  Neurological:     General: No focal deficit present.     Mental Status: She is alert.     Motor: No abnormal muscle tone.  Psychiatric:        Mood and  Affect: Mood normal.        Behavior: Behavior normal.     ED Results / Procedures / Treatments   Labs (all labs ordered are listed, but only abnormal results are displayed) Labs Reviewed - No data to display  EKG None  Radiology VAS [place] LOWER EXTREMITY VENOUS (DVT) (ONLY [facility] & [facility] 7a-7p)  Result Date: 04/04/2019  Lower Venous Study Indications: Pain.  Comparison Study: No prior study. Performing Technologist: Maudry Mayhew MHA, RDMS, RVT, RDCS  Examination Guidelines: A complete evaluation includes B-mode imaging, spectral Doppler, color Doppler, and power Doppler as needed of all accessible portions of each vessel. Bilateral testing is considered an integral part of a complete examination. Limited examinations for reoccurring indications may be performed as noted.  +---------+---------------+---------+-----------+----------+--------------+ RIGHT    CompressibilityPhasicitySpontaneityPropertiesThrombus Aging +---------+---------------+---------+-----------+----------+--------------+ CFV      Full           Yes      Yes                                 +---------+---------------+---------+-----------+----------+--------------+ SFJ      Full                                                        +---------+---------------+---------+-----------+----------+--------------+ FV Prox  Full                                                        +---------+---------------+---------+-----------+----------+--------------+ FV Mid   Full                                                        +---------+---------------+---------+-----------+----------+--------------+ FV DistalFull                                                        +---------+---------------+---------+-----------+----------+--------------+  PFV      Full                                                        +---------+---------------+---------+-----------+----------+--------------+ POP       Full           Yes      Yes                                 +---------+---------------+---------+-----------+----------+--------------+ PTV      Full                                                        +---------+---------------+---------+-----------+----------+--------------+ PERO     Full                                                        +---------+---------------+---------+-----------+----------+--------------+  Summary: Right: There is no evidence of deep vein thrombosis in the lower extremity. No cystic structure found in the popliteal fossa.  *See table(s) above for measurements and observations.    Preliminary     Procedures Procedures (including critical care time)  Medications Ordered in ED Medications - No data to display  ED Course  I have reviewed the triage vital signs and the nursing notes.  Pertinent labs & imaging results that were available during my care of the patient were reviewed by me and considered in my medical decision making (see chart for details).    MDM Rules/Calculators/A&P                     Patient is a 29 year old woman who presents today for evaluation of 3 days of right leg/calf pain and swelling. On exam she has increased circumference of the right calf when compared to the left calf.  There is no significant erythema.  Normal range of motion, do not suspect septic arthritis of the knee.  She does not have any significant redness and is afebrile, do not suspect infection.  She had a DVT study without evidence of DVT.  Recommended conservative care including rest, elevation, ice, and compression.  Discussed that if her symptoms do not improve she should follow-up with her primary care doctor and schedule additional/repeat DVT study in 1 week.  She does not take any hormones, have any known history of VTE, or have any significant risk factors for DVT.  Also given that this is a recurrent process where she has had swelling in the  leg before recommended rheumatology follow-up.  Pulses intact 2+ DP/PT right leg.   She does not have any trauma, and is able to ambulate without difficulty.  Her blood pressure was slightly elevated while in the emergency room, when I rechecked it prior to discharge it was 646 systolic.  She is informed that this is elevated and that she needs to follow-up with her primary care doctor in the next 1 to 2 weeks  for a recheck.  She will not be started on medication currently has a suspect a component of this is related to the stress of being in the emergency room.  Return precautions were discussed with patient who states their understanding.  At the time of discharge patient denied any unaddressed complaints or concerns.  Patient is agreeable for discharge home.  Note: Portions of this report may have been transcribed using voice recognition software. Every effort was made to ensure accuracy; however, inadvertent computerized transcription errors may be present  Final Clinical Impression(s) / ED Diagnoses Final diagnoses:  Right leg swelling  Hypertension, unspecified type    Rx / DC Orders ED Discharge Orders    None       Ollen Gross 04/04/19 1933    Daleen Bo, MD 04/08/19 [phone]

## 2019-04-04 NOTE — Discharge Instructions (Signed)
While in the emergency room today your blood pressure was slightly elevated.  We discussed that often times this can be due to the stress and anxiety associated with being in the emergency room, however I would recommend that you get this rechecked in the next 1 to 2 weeks. Please consider using compression socks, elevating your leg.  If you develop significant redness, fevers, or wounds, developed shortness of breath or have any other concerns please seek additional medical care and evaluation.  Please take Ibuprofen (Advil, motrin) and Tylenol (acetaminophen) to relieve your pain.  You may take up to 600 MG (3 pills) of normal strength ibuprofen every 8 hours as needed.  In between doses of ibuprofen you make take tylenol, up to 1,000 mg (two extra strength pills).  Do not take more than 3,000 mg tylenol in a 24 hour period.  Please check all medication labels as many medications such as pain and cold medications may contain tylenol.  Do not drink alcohol while taking these medications.  Do not take other NSAID'S while taking ibuprofen (such as aleve or naproxen).  Please take ibuprofen with food to decrease stomach upset.   If your leg swelling worsens or is not starting to improve in 1 week it is reasonable to get a repeat ultrasound done.  You may contact your primary care doctor to schedule this.

## 2019-04-22 ENCOUNTER — Ambulatory Visit: Payer: Self-pay | Admitting: Family Medicine

## 2019-04-22 ENCOUNTER — Encounter: Payer: Self-pay | Admitting: Family Medicine

## 2019-04-30 ENCOUNTER — Ambulatory Visit: Payer: 59 | Admitting: Student

## 2019-04-30 ENCOUNTER — Other Ambulatory Visit: Payer: Self-pay

## 2019-04-30 ENCOUNTER — Ambulatory Visit (INDEPENDENT_AMBULATORY_CARE_PROVIDER_SITE_OTHER): Payer: 59 | Admitting: Student

## 2019-04-30 ENCOUNTER — Encounter: Payer: Self-pay | Admitting: Student

## 2019-04-30 VITALS — BP 141/74 | HR 78 | Ht 64.0 in | Wt 188.4 lb

## 2019-04-30 DIAGNOSIS — N760 Acute vaginitis: Secondary | ICD-10-CM | POA: Diagnosis not present

## 2019-04-30 DIAGNOSIS — A749 Chlamydial infection, unspecified: Secondary | ICD-10-CM | POA: Diagnosis not present

## 2019-04-30 DIAGNOSIS — N898 Other specified noninflammatory disorders of vagina: Secondary | ICD-10-CM

## 2019-04-30 DIAGNOSIS — B9689 Other specified bacterial agents as the cause of diseases classified elsewhere: Secondary | ICD-10-CM | POA: Diagnosis not present

## 2019-04-30 DIAGNOSIS — Z113 Encounter for screening for infections with a predominantly sexual mode of transmission: Secondary | ICD-10-CM | POA: Diagnosis not present

## 2019-04-30 DIAGNOSIS — Z124 Encounter for screening for malignant neoplasm of cervix: Secondary | ICD-10-CM | POA: Diagnosis not present

## 2019-04-30 DIAGNOSIS — Z01419 Encounter for gynecological examination (general) (routine) without abnormal findings: Secondary | ICD-10-CM

## 2019-04-30 NOTE — Progress Notes (Signed)
History:  Ms. Amber Blanchard is a 29 y.o. [id number] who presents to clinic today for routine exam and follow up on genital wars vs. External hemorrhoids and boils. Patient states that she gets "swollen spots" a few times a month but they always go away. She reports that she was told she had anal warts a few years ago and did have some treatments for them, but it didn't help. She wants to know what else is going on down there. Has seen various specialists for this but doesn't know exactly what her diagnosis was.   She has no problems using the bathroom, denies any other ob-gyn complaints.   The following portions of the patient's history were reviewed and updated as appropriate: allergies, current medications, family history, past medical history, social history, past surgical history and problem list.  Review of Systems:  Review of Systems  Constitutional: Negative.   HENT: Negative.   Gastrointestinal: Negative.   Genitourinary: Negative.   Skin: Negative.   Neurological: Negative.   Psychiatric/Behavioral: Negative.       Objective:  Physical Exam BP (!) 141/74   Pulse 78   Ht 5' 4"  (1.626 m)   Wt 188 lb 6.4 oz (85.5 kg)   LMP 04/07/2019 (Approximate)   BMI 32.34 kg/m  Physical Exam  Constitutional: She is oriented to person, place, and time. She appears well-developed.  HENT:  Head: Normocephalic.  Respiratory: Effort normal.  GI: Soft.  Genitourinary:    Genitourinary Comments: Labia major and minora are normal-appearing, cervix and vaginal walls are pink with no irritation or lesions. Surrounding the anus are several warts and possible external hemorrhoid, It is hard and non-fluctuant, slightly-lighter colored than skin color.    Musculoskeletal:        General: Normal range of motion.     Cervical back: Normal range of motion.  Neurological: She is alert and oriented to person, place, and time.  Skin: Skin is warm and dry.      Labs and Imaging No results found for  this or any previous visit (from the past 24 hour(s)).  No results found.      Assessment & Plan:  1. Encounter for annual routine gynecological examination -Unable to tell what is wart and what may be possible hemorrhoids; please see images in media.  -Patient needs MD appointment for possible wart treatment, and examination of anal abscess; may need referral to general surgery for external hemorroid.  - [facility] - Cytology - PAP   Starr Lake, CNM 04/30/2019 5:28 PM

## 2019-04-30 NOTE — Patient Instructions (Signed)
How to Take a [employer] A sitz bath is a warm water bath that may be used to care for your rectum, genital area, or the area between your rectum and genitals (perineum). For a sitz bath, the water only comes up to your hips and covers your buttocks. A sitz bath may done at home in a bathtub or with a portable sitz bath that fits over the toilet. Your health care provider may recommend a sitz bath to help:  Relieve pain and discomfort after delivering a baby.  Relieve pain and itching from hemorrhoids or anal fissures.  Relieve pain after certain surgeries.  Relax muscles that are sore or tight. How to take a sitz bath Take 3-4 sitz baths a day, or as many as told by your health care provider. Bathtub sitz bath To take a sitz bath in a bathtub: 1. Partially fill a bathtub with warm water. The water should be deep enough to cover your hips and buttocks when you are sitting in the tub. 2. If your health care provider told you to put medicine in the water, follow his or her instructions. 3. Sit in the water. 4. Open the tub drain a little, and leave it open during your bath. 5. Turn on the warm water again, enough to replace the water that is draining out. Keep the water running throughout your bath. This helps keep the water at the right level and the right temperature. 6. Soak in the water for 15-20 minutes, or as long as told by your health care provider. 7. When you are done, be careful when you stand up. You may feel dizzy. 8. After the sitz bath, pat yourself dry. Do not rub your skin to dry it.  Over-the-toilet sitz bath To take a sitz bath with an over-the-toilet basin: 1. Follow the manufacturer's instructions. 2. Fill the basin with warm water. 3. If your health care provider told you to put medicine in the water, follow his or her instructions. 4. Sit on the seat. Make sure the water covers your buttocks and perineum. 5. Soak in the water for 15-20 minutes, or as long as told by  your health care provider. 6. After the sitz bath, pat yourself dry. Do not rub your skin to dry it. 7. Clean and dry the basin between uses. 8. Discard the basin if it cracks, or according to the manufacturer's instructions. Contact a health care provider if:  Your symptoms get worse. Do not continue with sitz baths if your symptoms get worse.  You have new symptoms. If this happens, do not continue with sitz baths until you talk with your health care provider. Summary  A sitz bath is a warm water bath in which the water only comes up to your hips and covers your buttocks.  A sitz bath may help relieve itching, relieve pain, and relax muscles that are sore or tight in the lower part of your body, including your genital area.  Take 3-4 sitz baths a day, or as many as told by your health care provider. Soak in the water for 15-20 minutes.  Do not continue with sitz baths if your symptoms get worse. This information is not intended to replace advice given to you by your health care provider. Make sure you discuss any questions you have with your health care provider. Document Revised: 08/13/2018 Document Reviewed: 03/16/2017 Elsevier Patient Education  [employer].

## 2019-05-01 ENCOUNTER — Other Ambulatory Visit: Payer: Self-pay

## 2019-05-01 MED ORDER — HYDROCORTISONE 2.5 % EX CREA: TOPICAL_CREAM | Freq: Two times a day (BID) | CUTANEOUS | 0 refills | Status: DC

## 2019-05-01 NOTE — Progress Notes (Signed)
Called patient back from a voicemail that she left regarding wanting "something" called in for symptoms from her visit on 04/30/2019. Per Dr.Anyanwu to give her some hydrocortisone cream and for her to be referred as instructed.  Left voicemail that I sent a Rx to her pharmacy and to let us know in a few days if that helped.

## 2019-05-02 LAB — CERVICOVAGINAL ANCILLARY ONLY
Bacterial Vaginitis (gardnerella): POSITIVE — AB
Candida Glabrata: NEGATIVE
Candida Vaginitis: NEGATIVE
Comment: NEGATIVE
Comment: NEGATIVE
Comment: NEGATIVE
Comment: NEGATIVE
Trichomonas: NEGATIVE

## 2019-05-02 LAB — CYTOLOGY - PAP
Chlamydia: POSITIVE — AB
Comment: NEGATIVE
Comment: NORMAL
Diagnosis: NEGATIVE
Neisseria Gonorrhea: NEGATIVE

## 2019-05-03 ENCOUNTER — Telehealth: Payer: Self-pay

## 2019-05-03 ENCOUNTER — Other Ambulatory Visit: Payer: Self-pay | Admitting: Certified Nurse Midwife

## 2019-05-03 ENCOUNTER — Other Ambulatory Visit: Payer: Self-pay | Admitting: Obstetrics & Gynecology

## 2019-05-03 DIAGNOSIS — B9689 Other specified bacterial agents as the cause of diseases classified elsewhere: Secondary | ICD-10-CM

## 2019-05-03 DIAGNOSIS — N760 Acute vaginitis: Secondary | ICD-10-CM

## 2019-05-03 DIAGNOSIS — B3731 Acute candidiasis of vulva and vagina: Secondary | ICD-10-CM

## 2019-05-03 DIAGNOSIS — B373 Candidiasis of vulva and vagina: Secondary | ICD-10-CM

## 2019-05-03 DIAGNOSIS — A749 Chlamydial infection, unspecified: Secondary | ICD-10-CM

## 2019-05-03 MED ORDER — FLUCONAZOLE 150 MG PO TABS: 150.0000 mg | ORAL_TABLET | Freq: Once | ORAL | 0 refills | Status: AC

## 2019-05-03 MED ORDER — AZITHROMYCIN 500 MG PO TABS: 1000.0000 mg | ORAL_TABLET | Freq: Once | ORAL | 0 refills | Status: AC

## 2019-05-03 MED ORDER — METRONIDAZOLE 500 MG PO TABS: 500.0000 mg | ORAL_TABLET | Freq: Two times a day (BID) | ORAL | 0 refills | Status: DC

## 2019-05-03 NOTE — Progress Notes (Addendum)
Flagyl, Azithromycin, and Diflucan Rx'd at request of Dr. Hulan Fray d/t +results.

## 2019-05-03 NOTE — Telephone Encounter (Signed)
Pt called this morning because she saw her test results in My Chart that she has Chlamydia & BV, so she requested for Rx to be sent, advised will send to Flagyl & Doxycycline to her pharmacy on file. Pt verbalized understanding.

## 2019-05-04 ENCOUNTER — Encounter: Payer: Self-pay | Admitting: Student

## 2019-05-04 ENCOUNTER — Other Ambulatory Visit: Payer: Self-pay | Admitting: Student

## 2019-05-04 DIAGNOSIS — A749 Chlamydial infection, unspecified: Secondary | ICD-10-CM | POA: Insufficient documentation

## 2019-05-06 ENCOUNTER — Encounter: Payer: Self-pay | Admitting: *Deleted

## 2019-05-08 ENCOUNTER — Encounter: Payer: Self-pay | Admitting: Family Medicine

## 2019-05-08 ENCOUNTER — Ambulatory Visit: Payer: 59 | Admitting: Family Medicine

## 2019-05-08 NOTE — Progress Notes (Signed)
Patient did not keep appointment today. She may call to reschedule.  

## 2019-06-24 ENCOUNTER — Ambulatory Visit: Payer: 59

## 2019-07-18 ENCOUNTER — Telehealth: Payer: 59 | Admitting: Physician Assistant

## 2019-07-18 ENCOUNTER — Telehealth: Payer: Self-pay | Admitting: *Deleted

## 2019-07-18 DIAGNOSIS — A630 Anogenital (venereal) warts: Secondary | ICD-10-CM

## 2019-07-18 DIAGNOSIS — K644 Residual hemorrhoidal skin tags: Secondary | ICD-10-CM

## 2019-07-18 DIAGNOSIS — N760 Acute vaginitis: Secondary | ICD-10-CM

## 2019-07-18 MED ORDER — FLUCONAZOLE 150 MG PO TABS: 150.0000 mg | ORAL_TABLET | Freq: Once | ORAL | 0 refills | Status: AC

## 2019-07-18 MED ORDER — METRONIDAZOLE 500 MG PO TABS: 500.0000 mg | ORAL_TABLET | Freq: Two times a day (BID) | ORAL | 0 refills | Status: DC

## 2019-07-18 NOTE — Progress Notes (Signed)
We are sorry that you are not feeling well. Here is how we plan to help! Based on what you shared with me it looks like you: May have a vaginosis due to bacteria  Vaginosis is an inflammation of the vagina that can result in discharge, itching and pain. The cause is usually a change in the normal balance of vaginal bacteria or an infection. Vaginosis can also result from reduced estrogen levels after menopause.  The most common causes of vaginosis are:   Bacterial vaginosis which results from an overgrowth of one on several organisms that are normally present in your vagina.   Yeast infections which are caused by a naturally occurring fungus called candida.   Vaginal atrophy (atrophic vaginosis) which results from the thinning of the vagina from reduced estrogen levels after menopause.   Trichomoniasis which is caused by a parasite and is commonly transmitted by sexual intercourse.  Factors that increase your risk of developing vaginosis include: Marland Kitchen Medications, such as antibiotics and steroids . Uncontrolled diabetes . Use of hygiene products such as bubble bath, vaginal spray or vaginal deodorant . Douching . Wearing damp or tight-fitting clothing . Using an intrauterine device (IUD) for birth control . Hormonal changes, such as those associated with pregnancy, birth control pills or menopause . Sexual activity . Having a sexually transmitted infection  Your treatment plan is Metronidazole or Flagyl 557m twice a day for 7 days.  I have electronically sent this prescription into the pharmacy that you have chosen. as well as a prescription for Diflucan if you get a yeast infection following antibiotic therapy.   Be sure to take all of the medication as directed. Stop taking any medication if you develop a rash, tongue swelling or shortness of breath. Mothers who are breast feeding should consider pumping and discarding their breast milk while on these antibiotics. However, there is no  consensus that infant exposure at these doses would be harmful.  Remember that medication creams can weaken latex condoms. . Please follow-up with your GYN if your symptoms fail to improve  HOME CARE:  Good hygiene may prevent some types of vaginosis from recurring and may relieve some symptoms:  . Avoid baths, hot tubs and whirlpool spas. Rinse soap from your outer genital area after a shower, and dry the area well to prevent irritation. Don't use scented or harsh soaps, such as those with deodorant or antibacterial action. .Marland KitchenAvoid irritants. These include scented tampons and pads. . Wipe from front to back after using the toilet. Doing so avoids spreading fecal bacteria to your vagina.  Other things that may help prevent vaginosis include:  .Marland KitchenDon't douche. Your vagina doesn't require cleansing other than normal bathing. Repetitive douching disrupts the normal organisms that reside in the vagina and can actually increase your risk of vaginal infection. Douching won't clear up a vaginal infection. . Use a latex condom. Both female and female latex condoms may help you avoid infections spread by sexual contact. . Wear cotton underwear. Also wear pantyhose with a cotton crotch. If you feel comfortable without it, skip wearing underwear to bed. Yeast thrives in mCampbell SoupYour symptoms should improve in the next day or two.  GET HELP RIGHT AWAY IF:  . You have pain in your lower abdomen ( pelvic area or over your ovaries) . You develop nausea or vomiting . You develop a fever . Your discharge changes or worsens . You have persistent pain with intercourse . You develop shortness of breath,  a rapid pulse, or you faint.  These symptoms could be signs of problems or infections that need to be evaluated by a medical provider now.  MAKE SURE YOU    Understand these instructions.  Will watch your condition.  Will get help right away if you are not doing well or get worse.  Your  e-visit answers were reviewed by a board certified advanced clinical practitioner to complete your personal care plan. Depending upon the condition, your plan could have included both over the counter or prescription medications. Please review your pharmacy choice to make sure that you have choses a pharmacy that is open for you to pick up any needed prescription, Your safety is important to [place]. If you have drug allergies check your prescription carefully.   You can use MyChart to ask questions about today's visit, request a non-urgent call back, or ask for a work or school excuse for 24 hours related to this e-Visit. If it has been greater than 24 hours you will need to follow up with your provider, or enter a new e-Visit to address those concerns. You will get a MyChart message within the next two days asking about your experience. I hope that your e-visit has been valuable and will speed your recovery.  Greater than 5 minutes, yet less than 10 minutes of time have been spent researching, coordinating and implementing care for this patient today.

## 2019-07-18 NOTE — Telephone Encounter (Signed)
Amber Blanchard left  A voicemail this afternoon she is having symptoms she would like to discuss with a nurse.  Tranae Laramie,RN

## 2019-07-19 NOTE — Telephone Encounter (Signed)
I called Amber Blanchard and she states her initial issue she took care of with evisit. She also states she was supposed to get a referral after her last visit and she never did . I asked what referral because I do not see any in her chart from last visit. She reports she wanted to be referred to colon and rectal at [facility]. She said Ardean Larsen, CNM wanted her to come back and see a doctor; but she feels we can't help her. I explained I will need to send her request to [facility] for approval and get back to her. She voices understanding. Undra Trembath,RN

## 2019-07-21 NOTE — Telephone Encounter (Signed)
Yes, she can have a referral to [facility] colorectal.

## 2019-07-23 NOTE — Telephone Encounter (Signed)
Order for referral placed. Patient is self pay. I called [facility] [phone] and because her  Insurance [facility]) shows as ineligible they cannot accept the referral. I asked if she did not have insurance if she could apply for financial assistance and They stated she would need to see someone in [place].  I called [facility] and left a message I was calling her back to follow up on her issue and to please call tomorrow if possible because we will be closed Thursday/ Friday. Will also send MyChart message.  Karmen Altamirano,RN

## 2019-07-24 NOTE — Telephone Encounter (Signed)
Called pt and pt was frustrated because the pt just requested that a referral to be sent to [facility] Colorectal and she was going to call to schedule her appt and there has been such a delay.  I informed pt that [facility] denied her referral per nurses note because she does not have insurance.  Pt stated "why does that even matter because I am going to schedule the appt and I get insurance on 08/01/19".  I advised to the pt that I will resend her records to [facility] and on the cover sheet place on there pt will receive insurance on 08/01/19 in hopes that it does not get denied again.  Pt given referral information and verbalized understanding.   Addison Naegeli, RN

## 2019-08-14 ENCOUNTER — Encounter: Payer: Self-pay | Admitting: Family Medicine

## 2019-08-14 NOTE — Progress Notes (Signed)
Patient ID: Amber Blanchard, female   DOB: Nov 03, 1990, 29 y.o.   MRN: [id number]   Darrick Penna from [facility] [phone]) called the offices and stated she had called [facility] about her referral to there office. Darrick Penna stated that she informed the patient that she cannot be seen until she has insurance unless she pays $210 upfront to be seen. Darrick Penna stated that the patient for rude with her and used vulgar language and hung up the phone.

## 2019-08-20 ENCOUNTER — Telehealth: Payer: Self-pay | Admitting: Physician Assistant

## 2019-08-20 DIAGNOSIS — N760 Acute vaginitis: Secondary | ICD-10-CM

## 2019-08-20 MED ORDER — FLUCONAZOLE 150 MG PO TABS: 150.0000 mg | ORAL_TABLET | Freq: Once | ORAL | 0 refills | Status: AC

## 2019-08-20 MED ORDER — METRONIDAZOLE 500 MG PO TABS: 500.0000 mg | ORAL_TABLET | Freq: Two times a day (BID) | ORAL | 0 refills | Status: DC

## 2019-08-20 NOTE — Progress Notes (Signed)
Hi Amber Blanchard,   We are sorry that you are not feeling well.  Based on what you shared with me it looks like you: May have a vaginosis due to bacteria  (see below for your current treatment plan)  You are absolutely right, you should try a different method of management/treatment for your symptoms as taking an antibiotic every month is not beneficial to your health. There are several options for alternative treatment, but unfortunately, these need to be discussed and prescribed by a Primary Care Provider.  The reason for this is you will need close follow-up with a medical provider who knows what you have tried and what has/has not worked for you.   If you do not have a PCP, [facility] offers a free physician referral service available at [phone]. Our trained staff has the experience, knowledge and resources to put you in touch with a physician who is right for you.   You also have the option of a video visit through [email or web address]   Vaginosis is an inflammation of the vagina that can result in discharge, itching and pain. The cause is usually a change in the normal balance of vaginal bacteria or an infection. Vaginosis can also result from reduced estrogen levels after menopause.  The most common causes of vaginosis are:   Bacterial vaginosis which results from an overgrowth of one on several organisms that are normally present in your vagina.   Yeast infections which are caused by a naturally occurring fungus called candida.   Vaginal atrophy (atrophic vaginosis) which results from the thinning of the vagina from reduced estrogen levels after menopause.   Trichomoniasis which is caused by a parasite and is commonly transmitted by sexual intercourse.  Factors that increase your risk of developing vaginosis include: Marland Kitchen Medications, such as antibiotics and steroids . Uncontrolled diabetes . Use of hygiene products such as bubble bath, vaginal spray or vaginal  deodorant . Douching . Wearing damp or tight-fitting clothing . Using an intrauterine device (IUD) for birth control . Hormonal changes, such as those associated with pregnancy, birth control pills or menopause . Sexual activity . Having a sexually transmitted infection  Your treatment plan is Metronidazole or Flagyl 500mg  twice a day for 7 days.  I have electronically sent this prescription into the pharmacy that you have chosen.  I have also prescribed Diflucan in case you develop symptoms of a yeast infection - so not take this unless you develop vaginal itching and/or discharge.   Be sure to take all of the medication as directed. Stop taking any medication if you develop a rash, tongue swelling or shortness of breath. Mothers who are breast feeding should consider pumping and discarding their breast milk while on these antibiotics. However, there is no consensus that infant exposure at these doses would be harmful.  Remember that medication creams can weaken latex condoms. Marland Kitchen   HOME CARE:  Good hygiene may prevent some types of vaginosis from recurring and may relieve some symptoms:  . Avoid baths, hot tubs and whirlpool spas. Rinse soap from your outer genital area after a shower, and dry the area well to prevent irritation. Don't use scented or harsh soaps, such as those with deodorant or antibacterial action. Marland Kitchen Avoid irritants. These include scented tampons and pads. . Wipe from front to back after using the toilet. Doing so avoids spreading fecal bacteria to your vagina.  Other things that may help prevent vaginosis include:  Marland Kitchen Don't douche. Your vagina doesn't require cleansing  other than normal bathing. Repetitive douching disrupts the normal organisms that reside in the vagina and can actually increase your risk of vaginal infection. Douching won't clear up a vaginal infection. . Use a latex condom. Both female and female latex condoms may help you avoid infections spread by  sexual contact. . Wear cotton underwear. Also wear pantyhose with a cotton crotch. If you feel comfortable without it, skip wearing underwear to bed. Yeast thrives in [employer] Your symptoms should improve in the next day or two.  GET HELP RIGHT AWAY IF:  . You have pain in your lower abdomen ( pelvic area or over your ovaries) . You develop nausea or vomiting . You develop a fever . Your discharge changes or worsens . You have persistent pain with intercourse . You develop shortness of breath, a rapid pulse, or you faint.  These symptoms could be signs of problems or infections that need to be evaluated by a medical provider now.  MAKE SURE YOU    Understand these instructions.  Will watch your condition.  Will get help right away if you are not doing well or get worse.  Your e-visit answers were reviewed by a board certified advanced clinical practitioner to complete your personal care plan. Depending upon the condition, your plan could have included both over the counter or prescription medications. Please review your pharmacy choice to make sure that you have choses a pharmacy that is open for you to pick up any needed prescription, Your safety is important to [place]. If you have drug allergies check your prescription carefully.   You can use MyChart to ask questions about today's visit, request a non-urgent call back, or ask for a work or school excuse for 24 hours related to this e-Visit. If it has been greater than 24 hours you will need to follow up with your provider, or enter a new e-Visit to address those concerns. You will get a MyChart message within the next two days asking about your experience. I hope that your e-visit has been valuable and will speed your recovery.  Greater than 5 minutes, yet less than 10 minutes of time have been spent researching, coordinating and implementing care for this patient today.

## 2019-09-05 ENCOUNTER — Ambulatory Visit (INDEPENDENT_AMBULATORY_CARE_PROVIDER_SITE_OTHER): Payer: BC Managed Care – PPO | Admitting: Family Medicine

## 2019-09-05 ENCOUNTER — Other Ambulatory Visit: Payer: Self-pay

## 2019-09-05 ENCOUNTER — Encounter: Payer: Self-pay | Admitting: Family Medicine

## 2019-09-05 VITALS — BP 164/97 | HR 87 | Ht 63.0 in | Wt 178.0 lb

## 2019-09-05 DIAGNOSIS — I10 Essential (primary) hypertension: Secondary | ICD-10-CM

## 2019-09-05 DIAGNOSIS — L0231 Cutaneous abscess of buttock: Secondary | ICD-10-CM

## 2019-09-05 MED ORDER — FLUCONAZOLE 150 MG PO TABS
150.0000 mg | ORAL_TABLET | ORAL | 0 refills | Status: DC | PRN
Start: 2019-09-05 — End: 2020-02-07

## 2019-09-05 MED ORDER — SULFAMETHOXAZOLE-TRIMETHOPRIM 800-160 MG PO TABS
1.0000 | ORAL_TABLET | Freq: Two times a day (BID) | ORAL | 0 refills | Status: DC
Start: 2019-09-05 — End: 2019-10-07

## 2019-09-05 MED ORDER — AMLODIPINE BESYLATE 5 MG PO TABS: 5.0000 mg | ORAL_TABLET | Freq: Every day | ORAL | 1 refills | Status: DC

## 2019-09-05 NOTE — Progress Notes (Signed)
Referral place to family practice for patient to establish care with PCP- scheduled appt with Zaleski Brassfield 6/30 @ 8am

## 2019-09-05 NOTE — Progress Notes (Signed)
   Subjective:    Patient ID: Amber Blanchard, female    DOB: September 18, 1990, 29 y.o.   MRN: [id number]  HPI  Patient seen for abscess on her right buttock. This started a few months ago and was drained a few times. She has seen colorectal surgery in the past and has an appointment in the end of July for this. Recent abscess last week.  Review of Systems     Objective:   Physical Exam Vitals reviewed. Exam conducted with a chaperone present.  HENT:     Head: Normocephalic and atraumatic.  Genitourinary:   Skin:    Capillary Refill: Capillary refill takes less than 2 seconds.  Neurological:     Mental Status: She is alert.  Psychiatric:        Mood and Affect: Mood normal.        Behavior: Behavior normal.        Thought Content: Thought content normal.        Judgment: Judgment normal.           Assessment & Plan:  1. Essential hypertension Start amlodipine - Ambulatory referral to [facility]  2. Abscess of buttock, right Due to the extensive area of, what appears to be chronic abscess formation, there is not anything that I can definitively incise and drain.  Bactrim BID x 14 days.  F/u with colo rectal surgery

## 2019-09-25 ENCOUNTER — Ambulatory Visit: Payer: BC Managed Care – PPO | Admitting: Family Medicine

## 2019-10-04 ENCOUNTER — Ambulatory Visit: Payer: BC Managed Care – PPO | Admitting: Family Medicine

## 2019-10-07 ENCOUNTER — Encounter: Payer: Self-pay | Admitting: Family Medicine

## 2019-10-07 ENCOUNTER — Other Ambulatory Visit: Payer: Self-pay

## 2019-10-07 ENCOUNTER — Ambulatory Visit: Payer: BC Managed Care – PPO | Admitting: Family Medicine

## 2019-10-07 VITALS — BP 142/90 | HR 90 | Temp 97.8°F | Ht 63.0 in | Wt 187.6 lb

## 2019-10-07 DIAGNOSIS — R030 Elevated blood-pressure reading, without diagnosis of hypertension: Secondary | ICD-10-CM

## 2019-10-07 MED ORDER — AMLODIPINE BESYLATE 2.5 MG PO TABS: 2.5000 mg | ORAL_TABLET | Freq: Every day | ORAL | 3 refills | Status: DC

## 2019-10-07 NOTE — Patient Instructions (Signed)

## 2019-10-07 NOTE — Progress Notes (Signed)
New Patient Office Visit  Subjective:  Patient ID: Amber Blanchard, female    DOB: 11/14/1990  Age: 29 y.o. MRN: [id number]  CC:  Chief Complaint  Patient presents with  . New Patient (Initial Visit)    Pt is here to establish care no other concerns     HPI Amber Blanchard presents for establishing care.  Recently diagnosed with hypertension and started on amlodipine 5 mg daily per GYN.  She only took 1 dose and felt dizzy and stopped and wished to discuss further before proceeding.  Her past medical history is reviewed.  Generally fairly healthy.  She has no real chronic medical problems.  She has had C-sections 2012 and 2016.  No other surgeries.  No known drug allergies.  Currently takes no prescription medications.  She had recent blood pressure reading at GYN of 164/97.  This has been up multiple times recently.  Social history she is single.  Non-smoker.  Does drink alcohol usually 1 occasionally 2 beverages per day.  2 children.  She works and Mudlogger with [employer]  Family history-Father has hypertension.  Mother is alive and well.  5 sisters and 2 brothers and she states they have no significant medical problems.  No cancer.  No premature heart disease.  Past Medical History:  Diagnosis Date  . Anal stricture   . Anemia   . Cholelithiasis   . External hemorrhoids   . Genital condyloma, female   . HPV in female     Past Surgical History:  Procedure Laterality Date  . CESAREAN SECTION  10/21/2010   Procedure: CESAREAN SECTION;  Surgeon: Catha Brow;  Location: [facility];  Service: Gynecology;  Laterality: N/A;  . CESAREAN SECTION WITH BILATERAL TUBAL LIGATION Bilateral 02/20/2015   Procedure: CESAREAN SECTION WITH BILATERAL TUBAL LIGATION;  Surgeon: Woodroe Mode, MD;  Location: [facility];  Service: Obstetrics;  Laterality: Bilateral;    No family history on file.  Social History   Socioeconomic History  . Marital status: Single    Spouse name: Not on file  . Number  of children: Not on file  . Years of education: Not on file  . Highest education level: Not on file  Occupational History  . Not on file  Tobacco Use  . Smoking status: Never Smoker  . Smokeless tobacco: Never Used  Substance and Sexual Activity  . Alcohol use: Yes  . Drug use: Yes    Types: Marijuana  . Sexual activity: Not Currently  Other Topics Concern  . Not on file  Social History Narrative  . Not on file   Social Determinants of Health   Financial Resource Strain:   . Difficulty of Paying Living Expenses:   Food Insecurity:   . Worried About Charity fundraiser in the Last Year:   . Arboriculturist in the Last Year:   Transportation Needs:   . Film/video editor (Medical):   Marland Kitchen Lack of Transportation (Non-Medical):   Physical Activity:   . Days of Exercise per Week:   . Minutes of Exercise per Session:   Stress:   . Feeling of Stress :   Social Connections:   . Frequency of Communication with Friends and Family:   . Frequency of Social Gatherings with Friends and Family:   . Attends Religious Services:   . Active Member of Clubs or Organizations:   . Attends Archivist Meetings:   Marland Kitchen Marital Status:   Intimate Production manager  Violence:   . Fear of Current or Ex-Partner:   . Emotionally Abused:   Marland Kitchen Physically Abused:   . Sexually Abused:     ROS Review of Systems  Constitutional: Negative for fatigue and unexpected weight change.  Eyes: Negative for visual disturbance.  Respiratory: Negative for cough, chest tightness, shortness of breath and wheezing.   Cardiovascular: Negative for chest pain, palpitations and leg swelling.  Neurological: Negative for dizziness, seizures, syncope, weakness, light-headedness and headaches.    Objective:   Today's Vitals: BP (!) 142/90 (BP Location: Left Arm, Cuff Size: Large)   Pulse 90   Temp 97.8 F (36.6 C) (Oral)   Ht 5' 3"  (1.6 m)   Wt 187 lb 9.6 oz (85.1 kg)   SpO2 98%   BMI 33.23 kg/m   Physical  Exam Constitutional:      Appearance: She is well-developed.  Eyes:     Pupils: Pupils are equal, round, and reactive to light.  Neck:     Thyroid: No thyromegaly.     Vascular: No JVD.  Cardiovascular:     Rate and Rhythm: Normal rate and regular rhythm.     Heart sounds: No gallop.   Pulmonary:     Effort: Pulmonary effort is normal. No respiratory distress.     Breath sounds: Normal breath sounds. No wheezing or rales.  Musculoskeletal:     Cervical back: Neck supple.     Right lower leg: No edema.     Left lower leg: No edema.  Neurological:     Mental Status: She is alert.     Assessment & Plan:   #1 elevated blood pressure.  Initial reading here today was 126/62 but when I checked this with a large cuff left arm seated obtained 142/90 and this was repeated with the same reading.  She has also had multiple elevated readings at GYN in other offices. -Discussed nonpharmacologic management of hypertension with weight control, regular aerobic exercise, watching sodium intake. -We explained that basically any blood pressure medication may cause some lightheadedness and dizziness with initiation.  She felt intolerant of 5 mg amlodipine.  We will try 2.5 mg once daily and reassess blood pressure here in 3 weeks. -Also discussed importance of watching alcohol intake and explained that alcohol can exacerbate blood pressure -Would ideally like to see her get home blood pressure cuff to monitor at home -Obtain some baseline screening labs.  She is about 5 hours postprandial today  Outpatient Encounter Medications as of 10/07/2019  Medication Sig  . fluconazole (DIFLUCAN) 150 MG tablet Take 1 tablet (150 mg total) by mouth every three (3) days as needed.  . [DISCONTINUED] amLODipine (NORVASC) 5 MG tablet Take 1 tablet (5 mg total) by mouth daily.  . [DISCONTINUED] sulfamethoxazole-trimethoprim (BACTRIM DS) 800-160 MG tablet Take 1 tablet by mouth 2 (two) times daily.  Marland Kitchen amLODipine  (NORVASC) 2.5 MG tablet Take 1 tablet (2.5 mg total) by mouth daily.  . [DISCONTINUED] azithromycin (ZITHROMAX) tablet 1,000 mg    No facility-administered encounter medications on file as of 10/07/2019.    Follow-up: Return in about 3 weeks (around 10/28/2019).   Carolann Littler, MD

## 2019-10-08 LAB — CBC WITH DIFFERENTIAL/PLATELET
Absolute Monocytes: 753 cells/uL (ref 200–950)
Basophils Absolute: 56 cells/uL (ref 0–200)
Basophils Relative: 0.6 %
Eosinophils Absolute: 205 cells/uL (ref 15–500)
Eosinophils Relative: 2.2 %
HCT: 30.7 % — ABNORMAL LOW (ref 35.0–45.0)
Hemoglobin: 9.3 g/dL — ABNORMAL LOW (ref 11.7–15.5)
Lymphs Abs: 2539 cells/uL (ref 850–3900)
MCH: 23.8 pg — ABNORMAL LOW (ref 27.0–33.0)
MCHC: 30.3 g/dL — ABNORMAL LOW (ref 32.0–36.0)
MCV: 78.7 fL — ABNORMAL LOW (ref 80.0–100.0)
MPV: 10.6 fL (ref 7.5–12.5)
Monocytes Relative: 8.1 %
Neutro Abs: 5747 cells/uL (ref 1500–7800)
Neutrophils Relative %: 61.8 %
Platelets: 312 10*3/uL (ref 140–400)
RBC: 3.9 10*6/uL (ref 3.80–5.10)
RDW: 17.3 % — ABNORMAL HIGH (ref 11.0–15.0)
Total Lymphocyte: 27.3 %
WBC: 9.3 10*3/uL (ref 3.8–10.8)

## 2019-10-08 LAB — LIPID PANEL
Cholesterol: 149 mg/dL (ref <200)
HDL: 59 mg/dL (ref >=50)
LDL Cholesterol (Calc): 77 mg/dL (calc)
Non-HDL Cholesterol (Calc): 90 mg/dL (calc) (ref <130)
Total CHOL/HDL Ratio: 2.5 (calc) (ref <5.0)
Triglycerides: 57 mg/dL (ref <150)

## 2019-10-08 LAB — HEPATIC FUNCTION PANEL
AG Ratio: 1 (calc) (ref 1.0–2.5)
ALT: 15 U/L (ref 6–29)
AST: 22 U/L (ref 10–30)
Albumin: 3.8 g/dL (ref 3.6–5.1)
Alkaline phosphatase (APISO): 75 U/L (ref 31–125)
Bilirubin, Direct: 0.1 mg/dL (ref 0.0–0.2)
Globulin: 4 g/dL (calc) — ABNORMAL HIGH (ref 1.9–3.7)
Indirect Bilirubin: 0.3 mg/dL (calc) (ref 0.2–1.2)
Total Bilirubin: 0.4 mg/dL (ref 0.2–1.2)
Total Protein: 7.8 g/dL (ref 6.1–8.1)

## 2019-10-08 LAB — BASIC METABOLIC PANEL
BUN: 15 mg/dL (ref 7–25)
CO2: 26 mmol/L (ref 20–32)
Calcium: 9.1 mg/dL (ref 8.6–10.2)
Chloride: 103 mmol/L (ref 98–110)
Creat: 0.71 mg/dL (ref 0.50–1.10)
Glucose, Bld: 87 mg/dL (ref 65–99)
Potassium: 4.3 mmol/L (ref 3.5–5.3)
Sodium: 136 mmol/L (ref 135–146)

## 2019-10-08 LAB — TSH: TSH: 0.28 mIU/L — ABNORMAL LOW

## 2019-10-25 ENCOUNTER — Telehealth: Payer: Self-pay | Admitting: Family Medicine

## 2019-10-25 NOTE — Telephone Encounter (Signed)
Please advise 

## 2019-10-25 NOTE — Telephone Encounter (Signed)
Pt called to say her medication  amLODipine (NORVASC) 2.5 MG tablet   Is making her sleepy and she doesn't like it and want to try something else  Please call her at [phone]  Please advise

## 2019-10-27 NOTE — Telephone Encounter (Signed)
I have never heard of sedation as a side effect from Amlodipine and did not see that listed when I looked up common side effects.   Make sure she is taking at night.  If taking at night and still consistently feeling sedated leave off and will discuss other options at follow up.  My preference would be that she try to stay on this if possible.

## 2019-10-28 NOTE — Telephone Encounter (Signed)
Tried informing pt of What dr.Burchette suggested pt got very upset and states she does not like this medication and wants something different when suggested what Dr.burchette did of leaving it off and discussing another medication at follow up pt became very rude transferred to office administer

## 2019-11-04 ENCOUNTER — Ambulatory Visit: Payer: BC Managed Care – PPO | Admitting: Family Medicine

## 2019-11-04 DIAGNOSIS — Z0289 Encounter for other administrative examinations: Secondary | ICD-10-CM

## 2020-01-07 ENCOUNTER — Encounter: Payer: Self-pay | Admitting: Gastroenterology

## 2020-01-08 ENCOUNTER — Encounter: Payer: Self-pay | Admitting: Gastroenterology

## 2020-01-29 ENCOUNTER — Other Ambulatory Visit: Payer: Self-pay

## 2020-01-29 ENCOUNTER — Ambulatory Visit: Payer: Medicaid Other | Admitting: *Deleted

## 2020-01-29 NOTE — Progress Notes (Signed)
2:57 not in lobby when nurse called for her. Librada Castronovo,RN  3:00 Patient not in lobby when nurse called for her.  Roswell Ndiaye,RN 3:07 Patient not in lobby when nurse called. Will ask registrar to offer reschedule. Ena Demary,RN

## 2020-02-03 ENCOUNTER — Ambulatory Visit: Payer: BC Managed Care – PPO | Admitting: Gastroenterology

## 2020-02-04 ENCOUNTER — Ambulatory Visit: Payer: Medicaid Other

## 2020-02-07 ENCOUNTER — Encounter: Payer: Self-pay | Admitting: General Surgery

## 2020-02-07 ENCOUNTER — Telehealth: Payer: Medicaid Other | Admitting: Nurse Practitioner

## 2020-02-07 DIAGNOSIS — N760 Acute vaginitis: Secondary | ICD-10-CM | POA: Diagnosis not present

## 2020-02-07 DIAGNOSIS — B9689 Other specified bacterial agents as the cause of diseases classified elsewhere: Secondary | ICD-10-CM | POA: Diagnosis not present

## 2020-02-07 MED ORDER — METRONIDAZOLE 500 MG PO TABS
500.0000 mg | ORAL_TABLET | Freq: Two times a day (BID) | ORAL | 0 refills | Status: DC
Start: 2020-02-07 — End: 2020-03-11

## 2020-02-07 MED ORDER — FLUCONAZOLE 150 MG PO TABS: 150.0000 mg | ORAL_TABLET | Freq: Once | ORAL | 0 refills | Status: AC

## 2020-02-07 NOTE — Progress Notes (Signed)
We are sorry that you are not feeling well. Here is how we plan to help! Based on what you shared with me it looks like you: May have a vaginosis due to bacteria  Vaginosis is an inflammation of the vagina that can result in discharge, itching and pain. The cause is usually a change in the normal balance of vaginal bacteria or an infection. Vaginosis can also result from reduced estrogen levels after menopause.  The most common causes of vaginosis are:   Bacterial vaginosis which results from an overgrowth of one on several organisms that are normally present in your vagina.   Yeast infections which are caused by a naturally occurring fungus called candida.   Vaginal atrophy (atrophic vaginosis) which results from the thinning of the vagina from reduced estrogen levels after menopause.   Trichomoniasis which is caused by a parasite and is commonly transmitted by sexual intercourse.  Factors that increase your risk of developing vaginosis include: Marland Kitchen Medications, such as antibiotics and steroids . Uncontrolled diabetes . Use of hygiene products such as bubble bath, vaginal spray or vaginal deodorant . Douching . Wearing damp or tight-fitting clothing . Using an intrauterine device (IUD) for birth control . Hormonal changes, such as those associated with pregnancy, birth control pills or menopause . Sexual activity . Having a sexually transmitted infection  Your treatment plan is Metronidazole or Flagyl 572m twice a day for 7 days.  I have electronically sent this prescription into the pharmacy that you have chosen. I also sent in a diflucan  For yeast prevention Be sure to take all of the medication as directed. Stop taking any medication if you develop a rash, tongue swelling or shortness of breath. Mothers who are breast feeding should consider pumping and discarding their breast milk while on these antibiotics. However, there is no consensus that infant exposure at these doses would be  harmful.  Remember that medication creams can weaken latex condoms. .Marland Kitchen  HOME CARE:  Good hygiene may prevent some types of vaginosis from recurring and may relieve some symptoms:  . Avoid baths, hot tubs and whirlpool spas. Rinse soap from your outer genital area after a shower, and dry the area well to prevent irritation. Don't use scented or harsh soaps, such as those with deodorant or antibacterial action. .Marland KitchenAvoid irritants. These include scented tampons and pads. . Wipe from front to back after using the toilet. Doing so avoids spreading fecal bacteria to your vagina.  Other things that may help prevent vaginosis include:  .Marland KitchenDon't douche. Your vagina doesn't require cleansing other than normal bathing. Repetitive douching disrupts the normal organisms that reside in the vagina and can actually increase your risk of vaginal infection. Douching won't clear up a vaginal infection. . Use a latex condom. Both female and female latex condoms may help you avoid infections spread by sexual contact. . Wear cotton underwear. Also wear pantyhose with a cotton crotch. If you feel comfortable without it, skip wearing underwear to bed. Yeast thrives in mCampbell SoupYour symptoms should improve in the next day or two.  GET HELP RIGHT AWAY IF:  . You have pain in your lower abdomen ( pelvic area or over your ovaries) . You develop nausea or vomiting . You develop a fever . Your discharge changes or worsens . You have persistent pain with intercourse . You develop shortness of breath, a rapid pulse, or you faint.  These symptoms could be signs of problems or infections that need  to be evaluated by a medical provider now.  MAKE SURE YOU    Understand these instructions.  Will watch your condition.  Will get help right away if you are not doing well or get worse.  Your e-visit answers were reviewed by a board certified advanced clinical practitioner to complete your personal care plan.  Depending upon the condition, your plan could have included both over the counter or prescription medications. Please review your pharmacy choice to make sure that you have choses a pharmacy that is open for you to pick up any needed prescription, Your safety is important to [place]. If you have drug allergies check your prescription carefully.   You can use MyChart to ask questions about today's visit, request a non-urgent call back, or ask for a work or school excuse for 24 hours related to this e-Visit. If it has been greater than 24 hours you will need to follow up with your provider, or enter a new e-Visit to address those concerns. You will get a MyChart message within the next two days asking about your experience. I hope that your e-visit has been valuable and will speed your recovery.  5-10 minutes spent reviewing and documenting in chart.

## 2020-03-02 ENCOUNTER — Ambulatory Visit: Payer: BC Managed Care – PPO | Admitting: Gastroenterology

## 2020-03-11 ENCOUNTER — Ambulatory Visit: Payer: Medicaid Other | Admitting: Registered Nurse

## 2020-03-11 ENCOUNTER — Other Ambulatory Visit: Payer: Self-pay

## 2020-03-11 VITALS — BP 138/82 | HR 70 | Temp 98.6°F | Resp 18 | Ht 63.0 in | Wt 204.4 lb

## 2020-03-11 DIAGNOSIS — Z862 Personal history of diseases of the blood and blood-forming organs and certain disorders involving the immune mechanism: Secondary | ICD-10-CM

## 2020-03-11 DIAGNOSIS — Z7689 Persons encountering health services in other specified circumstances: Secondary | ICD-10-CM

## 2020-03-11 DIAGNOSIS — I10 Essential (primary) hypertension: Secondary | ICD-10-CM

## 2020-03-11 DIAGNOSIS — R7989 Other specified abnormal findings of blood chemistry: Secondary | ICD-10-CM

## 2020-03-11 MED ORDER — LOSARTAN POTASSIUM 25 MG PO TABS: 25.0000 mg | ORAL_TABLET | Freq: Every day | ORAL | 0 refills | Status: DC

## 2020-03-11 NOTE — Progress Notes (Signed)
New Patient Office Visit  Subjective:  Patient ID: Amber Blanchard, female    DOB: 05-05-1990  Age: 29 y.o. MRN: [id number]  CC:  Chief Complaint  Patient presents with  . New Patient (Initial Visit)    Patient states she is here to establish care. Patient wants to discuss hypertension.    HPI [place] presents for visit to est care  Hypertension: Patient Currently taking: nothing - has been on amlodipine in the past, did not tolerate dt fatigue, angioedema, lightheadedness. First readings on arrival today are 170s/90s, later falling down towards normal ranges. Review of past few years show a number of elevated readings. Denies CV symptoms including: chest pain, shob, doe, headache, visual changes, fatigue, claudication, and dependent edema.   Previous readings and labs: BP Readings from Last 3 Encounters:  03/11/20 138/82  10/07/19 (!) 142/90  09/05/19 (!) 164/97   Lab Results  Component Value Date   CREATININE 0.71 10/07/2019     In addition, having some concerning symptoms including: hot flashes, heat intolerance, lightheadedness, shortness of breath, flushing, and tight chest Denies chest pain, palpitations, doe, dependent edema, new or different headaches, loc, change in taste or smell, unexpected weight changes.   Reviewed histories with patient, updated as warranted Reviewed recent labs with patient - labs in July 2021 show low TSH and microcytic hypochromic anemia.   Past Medical History:  Diagnosis Date  . Anal stricture   . Anemia   . Cholelithiasis   . External hemorrhoids   . Genital condyloma, female   . HPV in female   . Hypertension    Phreesia 03/11/2020    Past Surgical History:  Procedure Laterality Date  . CESAREAN SECTION  10/21/2010   Procedure: CESAREAN SECTION;  Surgeon: Jessee Avers;  Location: [facility];  Service: Gynecology;  Laterality: N/A;  . CESAREAN SECTION WITH BILATERAL TUBAL LIGATION Bilateral 02/20/2015   Procedure:  CESAREAN SECTION WITH BILATERAL TUBAL LIGATION;  Surgeon: Adam Phenix, MD;  Location: [facility];  Service: Obstetrics;  Laterality: Bilateral;    Family History  Problem Relation Age of Onset  . Healthy Mother   . Hypertension Father   . Healthy Sister   . Healthy Brother     Social History   Socioeconomic History  . Marital status: Single    Spouse name: Not on file  . Number of children: 2  . Years of education: Not on file  . Highest education level: Not on file  Occupational History  . Not on file  Tobacco Use  . Smoking status: Never Smoker  . Smokeless tobacco: Never Used  Vaping Use  . Vaping Use: Never used  Substance and Sexual Activity  . Alcohol use: Yes    Alcohol/week: 5.0 standard drinks    Types: 2 Shots of liquor, 3 Glasses of wine per week  . Drug use: Yes    Types: Marijuana  . Sexual activity: Not Currently  Other Topics Concern  . Not on file  Social History Narrative  . Not on file   Social Determinants of Health   Financial Resource Strain: Not on file  Food Insecurity: Not on file  Transportation Needs: Not on file  Physical Activity: Not on file  Stress: Not on file  Social Connections: Not on file  Intimate Partner Violence: Not on file    ROS Review of Systems  Constitutional: Negative.   HENT: Negative.   Eyes: Negative.   Respiratory: Negative.   Cardiovascular:  Negative.   Gastrointestinal: Negative.   Genitourinary: Negative.   Musculoskeletal: Negative.   Skin: Negative.   Neurological: Negative.   Psychiatric/Behavioral: Negative.   All other systems reviewed and are negative.   Objective:   Today's Vitals: BP 138/82   Pulse 70   Temp 98.6 F (37 C) (Temporal)   Resp 18   Ht 5\' 3"  (1.6 m)   Wt 204 lb 6.4 oz (92.7 kg)   LMP 02/20/2020   SpO2 100%   BMI 36.21 kg/m   Physical Exam Vitals and nursing note reviewed.  Constitutional:      General: She is not in acute distress.    Appearance: Normal  appearance. She is obese. She is not ill-appearing, toxic-appearing or diaphoretic.  Cardiovascular:     Rate and Rhythm: Normal rate and regular rhythm.     Pulses: Normal pulses.     Heart sounds: Normal heart sounds. No murmur heard. No friction rub. No gallop.   Pulmonary:     Effort: Pulmonary effort is normal. No respiratory distress.     Breath sounds: Normal breath sounds. No stridor. No wheezing, rhonchi or rales.  Chest:     Chest wall: No tenderness.  Skin:    General: Skin is warm and dry.  Neurological:     General: No focal deficit present.     Mental Status: She is alert and oriented to person, place, and time. Mental status is at baseline.  Psychiatric:        Mood and Affect: Mood normal.        Behavior: Behavior normal.        Thought Content: Thought content normal.        Judgment: Judgment normal.     Assessment & Plan:   Problem List Items Addressed This Visit   None   Visit Diagnoses    Encounter to establish care    -  Primary   Essential hypertension       Relevant Medications   losartan (COZAAR) 25 MG tablet   Low TSH level       Relevant Orders   Thyroid Panel With TSH   History of anemia       Relevant Orders   CBC   Iron, TIBC and Ferritin Panel      Outpatient Encounter Medications as of 03/11/2020  Medication Sig  . amLODipine (NORVASC) 2.5 MG tablet Take 1 tablet (2.5 mg total) by mouth daily. (Patient not taking: Reported on 03/11/2020)  . losartan (COZAAR) 25 MG tablet Take 1 tablet (25 mg total) by mouth daily.  . metroNIDAZOLE (FLAGYL) 500 MG tablet Take 1 tablet (500 mg total) by mouth 2 (two) times daily. (Patient not taking: Reported on 03/11/2020)   No facility-administered encounter medications on file as of 03/11/2020.    Follow-up: No follow-ups on file.   PLAN  Start losartan 25mg  PO Qd  Suspect hyperthyroidism and anemia contributing to symptoms. While patient expresses interest in lifestyle modifications that  can help these, first step will be rechecking labs including thyroid panel with tsh, cbc, and iron, tibc, and ferritin panel.  Follow up as warranted  Patient encouraged to call clinic with any questions, comments, or concerns.  03/13/2020, NP

## 2020-03-11 NOTE — Patient Instructions (Signed)
° ° ° °  If you have lab work done today you will be contacted with your lab results within the next 2 weeks.  If you have not heard from us then please contact us. The fastest way to get your results is to register for My Chart. ° ° °IF you received an x-ray today, you will receive an invoice from [facility]. Please contact [facility] at [phone] with questions or concerns regarding your invoice.  ° °IF you received labwork today, you will receive an invoice from [facility]. Please contact [facility] at [phone] with questions or concerns regarding your invoice.  ° °Our billing staff will not be able to assist you with questions regarding bills from these companies. ° °You will be contacted with the lab results as soon as they are available. The fastest way to get your results is to activate your My Chart account. Instructions are located on the last page of this paperwork. If you have not heard from us regarding the results in 2 weeks, please contact this office. °  ° ° ° °

## 2020-03-12 LAB — CBC
Hematocrit: 31.9 % — ABNORMAL LOW (ref 34.0–46.6)
Hemoglobin: 9.6 g/dL — ABNORMAL LOW (ref 11.1–15.9)
MCH: 23.7 pg — ABNORMAL LOW (ref 26.6–33.0)
MCHC: 30.1 g/dL — ABNORMAL LOW (ref 31.5–35.7)
MCV: 79 fL (ref 79–97)
Platelets: 292 10*3/uL (ref 150–450)
RBC: 4.05 x10E6/uL (ref 3.77–5.28)
RDW: 17.1 % — ABNORMAL HIGH (ref 11.7–15.4)
WBC: 8.1 10*3/uL (ref 3.4–10.8)

## 2020-03-12 LAB — IRON,TIBC AND FERRITIN PANEL
Ferritin: 8 ng/mL — ABNORMAL LOW (ref 15–150)
Iron Saturation: 5 % — CL (ref 15–55)
Iron: 19 ug/dL — ABNORMAL LOW (ref 27–159)
Total Iron Binding Capacity: 358 ug/dL (ref 250–450)
UIBC: 339 ug/dL (ref 131–425)

## 2020-03-12 LAB — THYROID PANEL WITH TSH
Free Thyroxine Index: 2.1 (ref 1.2–4.9)
T3 Uptake Ratio: 32 % (ref 24–39)
T4, Total: 6.6 ug/dL (ref 4.5–12.0)
TSH: 0.27 u[IU]/mL — ABNORMAL LOW (ref 0.450–4.500)

## 2020-03-16 ENCOUNTER — Telehealth: Payer: Self-pay | Admitting: Registered Nurse

## 2020-03-16 NOTE — Telephone Encounter (Signed)
Patient received lab results on 12/16. Patient wants call back to discuss them asap and is upset she hasn't been called yet. Please advise at [phone]

## 2020-03-17 NOTE — Telephone Encounter (Signed)
Pt wants results from labs. I see some are red I know we ask 10 days but she is concerned would you please advise. Thank you

## 2020-03-17 NOTE — Telephone Encounter (Signed)
Patient called very upset that we have not responded to her about her lab results. Patient was informed that were are running behind and I may not get the answer you need at this time but I will speak with you and get back with her. Patient  Informed we will get back with her with results once we spoke with you. Patient is very concern that you are concern about her labs being low and do not want to go through the holiday not knowing what she need to do about her low labs that she can see on my-chart.

## 2020-03-20 ENCOUNTER — Encounter: Payer: Self-pay | Admitting: Registered Nurse

## 2020-03-23 NOTE — Telephone Encounter (Signed)
Pt called still upset because we have not called her as of today . Pt scheduled and patient scheduled appt for this Wednesday to follow up on labs

## 2020-03-23 NOTE — Telephone Encounter (Signed)
This pt called very upset last week as there are some red flags on her numbers and no response from [place] please advise the result as soon as possible so I can send that on to her.  Thank you!

## 2020-03-25 ENCOUNTER — Ambulatory Visit: Payer: Medicaid Other | Admitting: Registered Nurse

## 2020-03-26 ENCOUNTER — Encounter: Payer: Self-pay | Admitting: Registered Nurse

## 2020-04-03 ENCOUNTER — Ambulatory Visit: Payer: Medicaid Other | Admitting: Gastroenterology

## 2020-04-21 ENCOUNTER — Ambulatory Visit: Payer: Medicaid Other | Admitting: Gastroenterology

## 2020-05-12 ENCOUNTER — Ambulatory Visit (INDEPENDENT_AMBULATORY_CARE_PROVIDER_SITE_OTHER): Payer: Medicaid Other | Admitting: Gastroenterology

## 2020-05-12 ENCOUNTER — Encounter: Payer: Self-pay | Admitting: Gastroenterology

## 2020-05-12 VITALS — BP 140/90 | HR 114 | Ht 63.0 in | Wt 211.0 lb

## 2020-05-12 DIAGNOSIS — K5900 Constipation, unspecified: Secondary | ICD-10-CM | POA: Diagnosis not present

## 2020-05-12 DIAGNOSIS — L290 Pruritus ani: Secondary | ICD-10-CM

## 2020-05-12 DIAGNOSIS — R109 Unspecified abdominal pain: Secondary | ICD-10-CM | POA: Diagnosis not present

## 2020-05-12 DIAGNOSIS — R933 Abnormal findings on diagnostic imaging of other parts of digestive tract: Secondary | ICD-10-CM

## 2020-05-12 DIAGNOSIS — D509 Iron deficiency anemia, unspecified: Secondary | ICD-10-CM | POA: Diagnosis not present

## 2020-05-12 DIAGNOSIS — K644 Residual hemorrhoidal skin tags: Secondary | ICD-10-CM

## 2020-05-12 DIAGNOSIS — K603 Anal fistula: Secondary | ICD-10-CM

## 2020-05-12 MED ORDER — NA SULFATE-K SULFATE-MG SULF 17.5-3.13-1.6 GM/177ML PO SOLN: 1.0000 | Freq: Once | ORAL | 0 refills | Status: AC

## 2020-05-12 NOTE — Patient Instructions (Addendum)
If you are age 31 or older, your body mass index should be between 23-30. Your Body mass index is 37.38 kg/m. If this is out of the aforementioned range listed, please consider follow up with your Primary Care Provider.  If you are age 26 or younger, your body mass index should be between 19-25. Your Body mass index is 37.38 kg/m. If this is out of the aformentioned range listed, please consider follow up with your Primary Care Provider.   We have sent the following medications to your pharmacy for you to pick up at your convenience:  Suprep  We are referring you to [facility] for a consultation with a Colorectal Surgeon. They will contact you to schedule the appointment. If you have not heard from them within 1 week please contact our office at [phone] to advise [place].  Due to recent changes in healthcare laws, you may see the results of your imaging and laboratory studies on MyChart before your provider has had a chance to review them.  We understand that in some cases there may be results that are confusing or concerning to you. Not all laboratory results come back in the same time frame and the provider may be waiting for multiple results in order to interpret others.  Please give [place] 48 hours in order for your provider to thoroughly review all the results before contacting the office for clarification of your results.    Thank you for choosing me and [facility].  Vito Cirigliano, D.O.

## 2020-05-12 NOTE — Progress Notes (Signed)
Chief Complaint: Iron deficiency anemia, perirectal symptoms   Referring Provider:     Maximiano Coss, NP   HPI:     Amber Blanchard is a 30 y.o. female history of HTN referred to the Gastroenterology Clinic for evaluation of iron deficiency anemia.  Looking back through records, has a longstanding history of microcytic anemia since at least 2015, but declining hemoglobin and MCV along with uptrending RDW since 2016.  She is otherwise without overt GI blood loss.  Recent iron panel c/w IDA, without previous panel for comparison.  No previous EGD or colonoscopy.  No recent abdominal imaging for review.  CT back in 03/2013 with questionable thickening at the ileocolonic junction, possibly due to her distention, with borderline enlarged ileocolic mesenteric lymph nodes.  Separately, she has "rectal problems". Intermittent discomfort and "not normal" with intermittent itching, swelling, and discharge. Has been present for years, and worsening. Was seen by GYN and told she has warts about 4 years ago and treated with topical Rx without change.   Occasional hard stools, and can go up to 3 weeks w/o BM. Will use laxative prn with relief. Has been for the last year or so. No diarrhea, hematochezia, melena, n/v/f/c. Good PO intake.   She does report intermittent upper abdominal pain. Tends to be post prandial with red meat, fatty/greasy foods, or liquor.   Recently diagnosed with hyperthyroidism, with TSH 0.27; not currently on any therapy.    CBC Latest Ref Rng & Units 03/11/2020 10/07/2019 02/21/2015  WBC 3.4 - 10.8 x10E3/uL 8.1 9.3 14.1(H)  Hemoglobin 11.1 - 15.9 g/dL 9.6(L) 9.3(L) 8.2(L)  Hematocrit 34.0 - 46.6 % 31.9(L) 30.7(L) 25.3(L)  Platelets 150 - 450 x10E3/uL 292 312 212   03/11/2020: Ferritin 8, iron 19, TIBC 358, sat 5%  Hepatic Function Latest Ref Rng & Units 10/07/2019 11/27/2013 05/06/2013  Total Protein 6.1 - 8.1 g/dL 7.8 7.2 8.2  Albumin 3.5 - 5.2 g/dL - 3.3(L) 3.5   AST 10 - 30 U/L 22 17 17   ALT 6 - 29 U/L 15 11 11   Alk Phosphatase 39 - 117 U/L - 68 92  Total Bilirubin 0.2 - 1.2 mg/dL 0.4 0.9 0.5  Bilirubin, Direct 0.0 - 0.2 mg/dL 0.1 0.0 <0.2    No known family history of CRC, GI malignancy, liver disease, pancreatic disease, or IBD.    Past Medical History:  Diagnosis Date  . Anal stricture   . Anemia   . Cholelithiasis   . External hemorrhoids   . Genital condyloma, female   . HPV in female   . Hypertension    Phreesia 03/11/2020     Past Surgical History:  Procedure Laterality Date  . CESAREAN SECTION  10/21/2010   Procedure: CESAREAN SECTION;  Surgeon: Catha Brow;  Location: [facility];  Service: Gynecology;  Laterality: N/A;  . CESAREAN SECTION WITH BILATERAL TUBAL LIGATION Bilateral 02/20/2015   Procedure: CESAREAN SECTION WITH BILATERAL TUBAL LIGATION;  Surgeon: Woodroe Mode, MD;  Location: [facility];  Service: Obstetrics;  Laterality: Bilateral;   Family History  Problem Relation Age of Onset  . Healthy Mother   . Hypertension Father   . Healthy Sister   . Healthy Brother    Social History   Tobacco Use  . Smoking status: Never Smoker  . Smokeless tobacco: Never Used  Vaping Use  . Vaping Use: Never used  Substance Use Topics  . Alcohol use: Yes  Alcohol/week: 5.0 standard drinks    Types: 2 Shots of liquor, 3 Glasses of wine per week  . Drug use: Yes    Types: Marijuana   No current outpatient medications on file.   No current facility-administered medications for this visit.   No Known Allergies   Review of Systems: All systems reviewed and negative except where noted in HPI.     Physical Exam:    Wt Readings from Last 3 Encounters:  05/12/20 211 lb (95.7 kg)  03/11/20 204 lb 6.4 oz (92.7 kg)  10/07/19 187 lb 9.6 oz (85.1 kg)    BP 140/90   Pulse (!) 114   Ht 5' 3"  (1.6 m)   Wt 211 lb (95.7 kg)   BMI 37.38 kg/m  Constitutional:  Pleasant, in no acute distress. Psychiatric: Normal mood and  affect. Behavior is normal. EENT: Pupils normal.  Conjunctivae are normal. No scleral icterus. Neck supple. No cervical LAD. Cardiovascular: Normal rate, regular rhythm. No edema Pulmonary/chest: Effort normal and breath sounds normal. No wheezing, rales or rhonchi. Abdominal: Soft, nondistended, nontender. Bowel sounds active throughout. There are no masses palpable. No hepatomegaly. Neurological: Alert and oriented to person place and time. Skin: Skin is warm and dry. No rashes noted. Rectal exam: Multiple perianal fistulous tracts on right  > left gluteus, and fistula with mild active draining adjacent to right anal verge.  Medium size skin tags, hemorrhoids.  Possible perianal warts.  (Chaperone: Lanny Hurst, CMA).    ASSESSMENT AND PLAN;   1) Perianal fistulae 2) Hemorrhoids 3) Skin tags 4) Pruritus ani 5) Possible perianal warts  -Colonoscopy to evaluate for IBD, grade/severity of hemorrhoids, additional mucosal pathology -Referral to Colorectal Surgery -Pending colonoscopy findings, possibly perform MRI pelvis  6) Iron deficiency anemia 7) Upper abdominal pain -EGD with gastric and duodenal biopsies -Colonoscopy -Expedited procedures this week then will plan to start oral iron therapy  8) Constipation 9) Abnormal imaging study -CT in 2015 with possible inflammation at ileocolic junction.  Plan for colonoscopy/ileoscopy as above -Increase water intake and can start laxative after colonoscopy this week  10) Low TSH -Follow-up with PCM for additional work-up and/or therapy  The indications, risks, and benefits of EGD and colonoscopy were explained to the patient in detail. Risks include but are not limited to bleeding, perforation, adverse reaction to medications, and cardiopulmonary compromise. Sequelae include but are not limited to the possibility of surgery, hospitalization, and mortality. The patient verbalized understanding and wished to proceed. All questions  answered, referred to scheduler and bowel prep ordered. Further recommendations pending results of the exam.       Lavena Bullion, DO, FACG  05/12/2020, 8:32 AM   Maximiano Coss, NP

## 2020-05-15 ENCOUNTER — Other Ambulatory Visit: Payer: Self-pay

## 2020-05-15 ENCOUNTER — Other Ambulatory Visit (INDEPENDENT_AMBULATORY_CARE_PROVIDER_SITE_OTHER): Payer: Medicaid Other

## 2020-05-15 ENCOUNTER — Ambulatory Visit (AMBULATORY_SURGERY_CENTER): Payer: Medicaid Other | Admitting: Gastroenterology

## 2020-05-15 ENCOUNTER — Encounter: Payer: Self-pay | Admitting: Gastroenterology

## 2020-05-15 VITALS — BP 126/67 | HR 59 | Temp 98.4°F | Resp 14 | Ht 63.0 in | Wt 211.0 lb

## 2020-05-15 DIAGNOSIS — K603 Anal fistula: Secondary | ICD-10-CM

## 2020-05-15 DIAGNOSIS — K5900 Constipation, unspecified: Secondary | ICD-10-CM

## 2020-05-15 DIAGNOSIS — K2980 Duodenitis without bleeding: Secondary | ICD-10-CM

## 2020-05-15 DIAGNOSIS — K56699 Other intestinal obstruction unspecified as to partial versus complete obstruction: Secondary | ICD-10-CM

## 2020-05-15 DIAGNOSIS — K2970 Gastritis, unspecified, without bleeding: Secondary | ICD-10-CM

## 2020-05-15 DIAGNOSIS — D509 Iron deficiency anemia, unspecified: Secondary | ICD-10-CM | POA: Diagnosis not present

## 2020-05-15 DIAGNOSIS — K642 Third degree hemorrhoids: Secondary | ICD-10-CM | POA: Diagnosis not present

## 2020-05-15 DIAGNOSIS — R109 Unspecified abdominal pain: Secondary | ICD-10-CM

## 2020-05-15 DIAGNOSIS — L290 Pruritus ani: Secondary | ICD-10-CM | POA: Diagnosis not present

## 2020-05-15 DIAGNOSIS — K315 Obstruction of duodenum: Secondary | ICD-10-CM

## 2020-05-15 DIAGNOSIS — R933 Abnormal findings on diagnostic imaging of other parts of digestive tract: Secondary | ICD-10-CM

## 2020-05-15 DIAGNOSIS — K50912 Crohn's disease, unspecified, with intestinal obstruction: Secondary | ICD-10-CM

## 2020-05-15 DIAGNOSIS — K2950 Unspecified chronic gastritis without bleeding: Secondary | ICD-10-CM

## 2020-05-15 LAB — SEDIMENTATION RATE: Sed Rate: 56 mm/hr — ABNORMAL HIGH (ref 0–20)

## 2020-05-15 LAB — HIGH SENSITIVITY CRP: CRP, High Sensitivity: 0.73 mg/L (ref 0.000–5.000)

## 2020-05-15 MED ORDER — SODIUM CHLORIDE 0.9 % IV SOLN: 500.0000 mL | Freq: Once | INTRAVENOUS | Status: DC

## 2020-05-15 NOTE — Progress Notes (Signed)
Called to room to assist during endoscopic procedure.  Patient ID and intended procedure confirmed with present staff. Received instructions for my participation in the procedure from the performing physician.  

## 2020-05-15 NOTE — Patient Instructions (Signed)
Handout given : polyp No aspirin, ibuprofen, naproxen, goody powder or any other anti inflammatory drugs Start soft diet Continue current medications Will schedule a  CT scan  Perform MRI I 2 weeks Make appointment for the colorectal surgery clinic Check blood work today return to GI clinic in 2-3 weeks  YOU HAD AN ENDOSCOPIC PROCEDURE TODAY AT [facility]:   Refer to the procedure report that was given to you for any specific questions about what was found during the examination.  If the procedure report does not answer your questions, please call your gastroenterologist to clarify.  If you requested that your care partner not be given the details of your procedure findings, then the procedure report has been included in a sealed envelope for you to review at your convenience later.  YOU SHOULD EXPECT: Some feelings of bloating in the abdomen. Passage of more gas than usual.  Walking can help get rid of the air that was put into your GI tract during the procedure and reduce the bloating. If you had a lower endoscopy (such as a colonoscopy or flexible sigmoidoscopy) you may notice spotting of blood in your stool or on the toilet paper. If you underwent a bowel prep for your procedure, you may not have a normal bowel movement for a few days.  Please Note:  You might notice some irritation and congestion in your nose or some drainage.  This is from the oxygen used during your procedure.  There is no need for concern and it should clear up in a day or so.  SYMPTOMS TO REPORT IMMEDIATELY:   Following lower endoscopy (colonoscopy or flexible sigmoidoscopy):  Excessive amounts of blood in the stool  Significant tenderness or worsening of abdominal pains  Swelling of the abdomen that is new, acute  Fever of 100F or higher   Following upper endoscopy (EGD)  Vomiting of blood or coffee ground material  New chest pain or pain under the shoulder blades  Painful or persistently  difficult swallowing  New shortness of breath  Fever of 100F or higher  Black, tarry-looking stools  For urgent or emergent issues, a gastroenterologist can be reached at any hour by calling [phone]. Do not use MyChart messaging for urgent concerns.   DIET:  We do recommend a small meal at first, but then you may proceed to your regular diet.  Drink plenty of fluids but you should avoid alcoholic beverages for 24 hours.  ACTIVITY:  You should plan to take it easy for the rest of today and you should NOT DRIVE or use heavy machinery until tomorrow (because of the sedation medicines used during the test).    FOLLOW UP: Our staff will call the number listed on your records 48-72 hours following your procedure to check on you and address any questions or concerns that you may have regarding the information given to you following your procedure. If we do not reach you, we will leave a message.  We will attempt to reach you two times.  During this call, we will ask if you have developed any symptoms of COVID 19. If you develop any symptoms (ie: fever, flu-like symptoms, shortness of breath, cough etc.) before then, please call [phone].  If you test positive for Covid 19 in the 2 weeks post procedure, please call and report this information to [place].    If any biopsies were taken you will be contacted by phone or by letter within the next 1-3 weeks.  Please call us at [phone] if you have not heard about the biopsies in 3 weeks.   SIGNATURES/CONFIDENTIALITY: You and/or your care partner have signed paperwork which will be entered into your electronic medical record.  These signatures attest to the fact that that the information above on your After Visit Summary has been reviewed and is understood.  Full responsibility of the confidentiality of this discharge information lies with you and/or your care-partner.

## 2020-05-15 NOTE — Progress Notes (Signed)
Pt's states no medical or surgical changes since previsit or office visit.  VS CW

## 2020-05-15 NOTE — Progress Notes (Signed)
Report to PACU, RN, vss, BBS= Clear.  

## 2020-05-15 NOTE — Op Note (Signed)
[facility] Patient Name: Amber Blanchard Procedure Date: 05/15/2020 7:49 AM MRN: [id number] Endoscopist: Gerrit Heck , MD Age: 30 Referring MD:  Date of Birth: [birth year] Gender: Female Account #: [id number] Procedure:                Upper GI endoscopy Indications:              Upper abdominal pain, Iron deficiency anemia Medicines:                Monitored Anesthesia Care Procedure:                Pre-Anesthesia Assessment:                           - Prior to the procedure, a History and Physical                            was performed, and patient medications and                            allergies were reviewed. The patient's tolerance of                            previous anesthesia was also reviewed. The risks                            and benefits of the procedure and the sedation                            options and risks were discussed with the patient.                            All questions were answered, and informed consent                            was obtained. Prior Anticoagulants: The patient has                            taken no previous anticoagulant or antiplatelet                            agents. ASA Grade Assessment: II - A patient with                            mild systemic disease. After reviewing the risks                            and benefits, the patient was deemed in                            satisfactory condition to undergo the procedure.                           After obtaining informed consent, the endoscope was  passed under direct vision. Throughout the                            procedure, the patient's blood pressure, pulse, and                            oxygen saturations were monitored continuously. The                            Endoscope was introduced through the mouth, and                            advanced to the second part of duodenum. The upper                            GI  endoscopy was accomplished without difficulty.                            The patient tolerated the procedure well. Scope In: Scope Out: Findings:                 The examined esophagus was normal.                           The entire examined stomach was normal. Biopsies                            were taken with a cold forceps for Helicobacter                            pylori testing. Estimated blood loss was minimal.                           Two acquired moderate stenoses were found in the                            second portion of the duodenum and were traversed.                            Small mucosal rent in each of the stenosed areas                            after endoscope passage. Biopsies were taken with a                            cold forceps for histology. Estimated blood loss                            was minimal.                           The duodenal bulb was normal. Complications:            No immediate complications. Estimated Blood Loss:     Estimated blood loss was minimal. Impression:               -  Normal esophagus.                           - Normal stomach. Biopsied.                           - Acquired duodenal stenosis. Biopsied.                           - Normal duodenal bulb. Recommendation:           - No aspirin, ibuprofen, naproxen, or other                            non-steroidal anti-inflammatory drugs.                           - Perform a colonoscopy today. Gerrit Heck, MD 05/15/2020 8:49:12 AM

## 2020-05-15 NOTE — Op Note (Signed)
[facility] Patient Name: Amber Blanchard Procedure Date: 05/15/2020 7:37 AM MRN: [id number] Endoscopist: Gerrit Heck , MD Age: 30 Referring MD:  Date of Birth: December 01, 1990 Gender: Female Account #: [id number] Procedure:                Colonoscopy Indications:              Upper abdominal pain, Iron deficiency anemia,                            Constipation, Pruritis ani, Perianal fistulae Medicines:                Monitored Anesthesia Care Procedure:                Pre-Anesthesia Assessment:                           - Prior to the procedure, a History and Physical                            was performed, and patient medications and                            allergies were reviewed. The patient's tolerance of                            previous anesthesia was also reviewed. The risks                            and benefits of the procedure and the sedation                            options and risks were discussed with the patient.                            All questions were answered, and informed consent                            was obtained. Prior Anticoagulants: The patient has                            taken no previous anticoagulant or antiplatelet                            agents. ASA Grade Assessment: II - A patient with                            mild systemic disease. After reviewing the risks                            and benefits, the patient was deemed in                            satisfactory condition to undergo the procedure.  After obtaining informed consent, the colonoscope                            was passed under direct vision. Throughout the                            procedure, the patient's blood pressure, pulse, and                            oxygen saturations were monitored continuously. The                            Olympus CF-HQ190L (Serial# [id number]) Colonoscope was                            introduced through  the anus and advanced to the the                            transverse colon. The colonoscopy was technically                            difficult and complex due to bowel stenosis. The                            patient tolerated the procedure well. The quality                            of the bowel preparation was good. The rectum was                            photographed. Scope In: 8:11:23 AM Scope Out: 8:39:24 AM Total Procedure Duration: 0 hours 28 minutes 1 second  Findings:                 The perianal exam findings include internal                            hemorrhoids with prolapse (Grade III), perianal                            fistula, scarring from prior fistulous tracts, and                            skin tags.                           A severe stenosis was found in the proximal                            transverse colon and was non-traversed. Biopsies                            were taken with a cold forceps for histology.  Estimated blood loss was minimal.                           The proximal sigmoid colon, descending colon and                            distal transverse colon appeared normal. Biopsies                            were taken with a cold forceps for histology.                            Estimated blood loss was minimal.                           A localized area of mildly erythematous mucosa was                            found in the mid sigmoid colon, located 25-35 cm                            from the anal verge. Biopsies were taken with a                            cold forceps for histology. Estimated blood loss                            was minimal.                           Localized moderate inflammation characterized by                            congestion (edema), erythema, and scarring, along                            with 1 pedunculated inflammatory polyp was found in                            the distal  rectum. Biopsies were taken with a cold                            forceps for histology. The polyp was removed with                            cold forceps. Estimated blood loss was minimal.                           Non-bleeding internal hemorrhoids were found during                            retroflexion. Complications:            No immediate complications. Estimated Blood Loss:     Estimated blood loss was minimal. Impression:               -  Internal hemorrhoids that prolapse with                            straining, but require manual replacement into the                            anal canal (Grade III), perianal fistula and                            perianal skin tags found on perianal exam.                           - Stricture in the proximal transverse colon.                            Biopsied.                           - The proximal sigmoid colon, descending colon and                            distal transverse colon are normal. Biopsied.                           - Erythematous mucosa in the mid sigmoid colon.                            Biopsied.                           - Localized moderate inflammation was found in the                            distal rectum. Biopsied.                           - Non-bleeding internal hemorrhoids.                           - Clinical appearance highly suspicious for                            stricturing, penetrating Crohns Diseas with upper                            tract modifier and perianal modifier. Recommendation:           - Patient has a contact number available for                            emergencies. The signs and symptoms of potential                            delayed complications were discussed with the  patient. Return to normal activities tomorrow.                            Written discharge instructions were provided to the                            patient.                            - Soft diet.                           - Continue present medications.                           - Await pathology results.                           - Perform a computed tomographic (CT scan)                            enterography at appointment to be scheduled.                           - Perform magnetic resonance imaging (MRI) with                            gadolinium in 2 weeks.                           - Check ESR, CRP, fecal calprotectin.                           - Check Quantiferon gold, TPMT, Hepatitis Bs Ab,                            and Hepatitis Bs Ag in anticipation of starting                            biologic therapy.                           - Make appointment in the [facility].                           - Return to GI clinic in 2-3 weeks. Gerrit Heck, MD 05/15/2020 9:00:40 AM

## 2020-05-18 ENCOUNTER — Telehealth: Payer: Self-pay | Admitting: Gastroenterology

## 2020-05-18 ENCOUNTER — Telehealth: Payer: Self-pay | Admitting: Emergency Medicine

## 2020-05-18 NOTE — Telephone Encounter (Signed)
Inbound call from patient requesting a call back from a nurse in regards to lab results please.

## 2020-05-18 NOTE — Telephone Encounter (Signed)
Patient was informed of message below.  Results for this patient's last labs in our office show a mild anemia and mild hyperthyroid state - these are both very steady from the labs done 6 mo prior to her visit with [place] and do not represent any acute concerns.  I would recommend that she start an iron supplement as we had discussed in our visit.  We can continue to follow the thyroid numbers every 6 months to make sure they do not change.   Thank you,   Kathrin Ruddy, NP

## 2020-05-18 NOTE — Telephone Encounter (Signed)
Spoke to patient to let her know that Dr Barron Alvine has not reviewed her lab results at this time. We will contact her as soon as he does. Patient voiced understanding.

## 2020-05-19 ENCOUNTER — Telehealth: Payer: Self-pay

## 2020-05-19 NOTE — Telephone Encounter (Signed)
  Follow up Call-  Call back number 05/15/2020  Post procedure Call Back phone  # [phone]  Permission to leave phone message Yes  Some recent data might be hidden     Patient questions:  Do you have a fever, pain , or abdominal swelling? No. Pain Score  0 *  Have you tolerated food without any problems? Yes.    Have you been able to return to your normal activities? Yes.    Do you have any questions about your discharge instructions: Diet   No. Medications  No. Follow up visit  No.  Do you have questions or concerns about your Care? No.  Actions: * If pain score is 4 or above: No action needed, pain <4.  1. Have you developed a fever since your procedure? no  2.   Have you had an respiratory symptoms (SOB or cough) since your procedure? no  3.   Have you tested positive for COVID 19 since your procedure no  4.   Have you had any family members/close contacts diagnosed with the COVID 19 since your procedure?  no   If yes to any of these questions please route to Joylene John, RN and Joella Prince, RN

## 2020-05-20 ENCOUNTER — Telehealth: Payer: Self-pay | Admitting: General Surgery

## 2020-05-20 NOTE — Telephone Encounter (Signed)
Patient called the office and we went over her lab results and made a follow up appt with Dr Bryan Lemma 06/04/2020. The patient stated she was contacted by [facility] and they will not see her until she is finished with seeing [place]. I explained I called and left a voicemail on [facility], Lattie Haw to call me back regarding an appt. Pt verbalized understanding

## 2020-05-20 NOTE — Telephone Encounter (Signed)
-----  Message from [place], DO sent at 05/19/2020  4:45 PM EST ----- Labs notable for the following: Negative/normal QuantiFERON gold, negative hepatitis B, normal CRP but elevated ESR (56).  TPMT still pending.  -Please ensure the patient has follow-up with me in the next 2-3 weeks or so  -Please check that patient has appointment with Colorectal Surgery -Will need hepatitis B vaccine series

## 2020-05-20 NOTE — Telephone Encounter (Signed)
Left a message on voicemail to contact the office for lab results and to schedule her for a follow up and Hepatitis B vaccine. Referral was sent to [facility] for colorectal surgeon. Spoke with [place] at [facility] and she stated

## 2020-05-21 ENCOUNTER — Telehealth: Payer: Self-pay | Admitting: General Surgery

## 2020-05-21 NOTE — Telephone Encounter (Signed)
Opened in error

## 2020-05-22 ENCOUNTER — Telehealth: Payer: Self-pay | Admitting: General Surgery

## 2020-05-22 DIAGNOSIS — K50019 Crohn's disease of small intestine with unspecified complications: Secondary | ICD-10-CM

## 2020-05-22 LAB — HEPATITIS B SURFACE ANTIGEN: Hepatitis B Surface Ag: NONREACTIVE

## 2020-05-22 LAB — QUANTIFERON-TB GOLD PLUS
Mitogen-NIL: >10 IU/mL
NIL: 0.04 IU/mL
QuantiFERON-TB Gold Plus: NEGATIVE
TB1-NIL: 0 IU/mL
TB2-NIL: 0.01 IU/mL

## 2020-05-22 LAB — HEPATITIS B SURFACE ANTIBODY,QUALITATIVE: Hep B S Ab: NONREACTIVE

## 2020-05-22 LAB — THIOPURINE METHYLTRANSFERASE (TPMT), RBC: Thiopurine Methyltransferase, RBC: 15 nmol/hr/mL RBC

## 2020-05-22 MED ORDER — PREDNISONE 10 MG PO TABS: ORAL_TABLET | ORAL | 0 refills | Status: AC

## 2020-05-22 NOTE — Telephone Encounter (Signed)
-----  Message from [place], DO sent at 05/22/2020  1:47 PM EST ----- Reviewed the biopsies taken from the recent upper endoscopy and colonoscopy which were notable for the following: -The biopsies taken from your stomach were notable for gastritis (inflammation), but there was no evidence of Helicobacter pylori infection.  -The biopsies taken from the duodenum (small intestine) demonstrate active and chronic inflammatory changes.  Based on endoscopic appearance along with findings on the colonoscopy, this seems most consistent with upper tract Crohn's disease. -The biopsies taken from the stricture in the colon also demonstrate severe active and chronic inflammatory changes, again consistent with Crohn's Disease.  The biopsies taken throughout the remainder of the colon were otherwise normal, without any inflammatory changes.  Overall, clinical presentation and endoscopic appearance consistent with Crohn's Disease with upper tract and perianal modifiers.  Plan for the following:  -Start trial of steroids with prednisone 60 mg/day x2 weeks, then reduce by 10 mg weekly -Please check that she has referral to Colorectal Surgery in place and appointment scheduled for evaluation of perianal disease -Elevated ESR but normal CRP suspicious for fixed, fibrotic stricture.  Will need to discuss surgical resection vs repeat colonoscopy with balloon dilation of the transverse stricture to allow evaluation of the proximal colon and terminal ileum  -If not already scheduled, please assist in scheduling CT enterography and MRI pelvis as previously recommended -Keep follow-up with me in the GI clinic as scheduled to discuss starting IBD therapy (in addition to steroids as above)

## 2020-05-22 NOTE — Telephone Encounter (Signed)
Contacted Amber Blanchard and discussed with her that the biopsies taken from the stricture in her colon demonstrated chronic inflammatory changes consistent with Crohns disease. I explained to her we need to start her on Prednisone 60/mg a day for 2 weeks then titration down to 10/mg. Explained also that we needed to schedule a CT enterography and  MRI The patient verbalized understanding and was concerned about weight gain. Also expressed to her that the scheduling department with [facility] will call her with appts for her CT and MRI.  Eliezer Lofts with [facility] and they have nothing available until 06/25/2020 she will talk with Dr Marcello Moores nurse and call back on Monday

## 2020-05-25 NOTE — Telephone Encounter (Signed)
Spoke with Lattie Haw at [facility] [facility] and they have schedule Amber Blanchard to see Dr Marcello Moores 06/09/2020 @12 :00 to arrive at 1130

## 2020-05-29 DIAGNOSIS — K50019 Crohn's disease of small intestine with unspecified complications: Secondary | ICD-10-CM

## 2020-05-29 DIAGNOSIS — R933 Abnormal findings on diagnostic imaging of other parts of digestive tract: Secondary | ICD-10-CM

## 2020-05-29 DIAGNOSIS — K56699 Other intestinal obstruction unspecified as to partial versus complete obstruction: Secondary | ICD-10-CM

## 2020-05-29 DIAGNOSIS — K5900 Constipation, unspecified: Secondary | ICD-10-CM

## 2020-05-29 DIAGNOSIS — R109 Unspecified abdominal pain: Secondary | ICD-10-CM

## 2020-05-29 DIAGNOSIS — D509 Iron deficiency anemia, unspecified: Secondary | ICD-10-CM

## 2020-05-29 DIAGNOSIS — K315 Obstruction of duodenum: Secondary | ICD-10-CM

## 2020-05-29 NOTE — Telephone Encounter (Signed)
Orders for CT enterography and MRI pelvis with and without contrast have been placed. Let patient know that she can schedule by calling [phone] again. [facility] does do MRI on saturdays but MRI have to be approved through insurance first and Im not sure if hers will be approved today.

## 2020-05-29 NOTE — Telephone Encounter (Signed)
.  A user error has taken place: error

## 2020-05-29 NOTE — Telephone Encounter (Signed)
Patient wants to know if she should keep her appointment with you on 3-10 because she hasn't had the CT entero and MRI pelvis due to orders not being placed? I placed a CT entero abd and pelvis with contrast and MRI pelvis with and without contrast. I hope that was ok.

## 2020-06-02 NOTE — Telephone Encounter (Signed)
I see that she has pushed her appt back a little. No problem, that will give her time to complete some imaging and meet with Dr. Marcello Moores at [facility]. She and I may need to discuss repeat colonoscopy in the near future at [facility] for dilation of the colon stricture depending on her imaging amnd response to meds. We will discuss at that appt. Thanks.

## 2020-06-04 ENCOUNTER — Ambulatory Visit: Payer: Medicaid Other | Admitting: Gastroenterology

## 2020-06-15 ENCOUNTER — Ambulatory Visit (HOSPITAL_COMMUNITY): Admission: RE | Admit: 2020-06-15 | Payer: Medicaid Other | Source: Ambulatory Visit

## 2020-06-15 ENCOUNTER — Ambulatory Visit: Payer: Medicaid Other

## 2020-06-15 ENCOUNTER — Other Ambulatory Visit: Payer: Medicaid Other

## 2020-06-23 ENCOUNTER — Ambulatory Visit (HOSPITAL_COMMUNITY): Payer: Medicaid Other

## 2020-06-23 ENCOUNTER — Ambulatory Visit: Payer: Medicaid Other | Admitting: Gastroenterology

## 2020-06-26 ENCOUNTER — Ambulatory Visit: Payer: Medicaid Other | Admitting: Gastroenterology

## 2020-07-15 ENCOUNTER — Telehealth: Payer: Medicaid Other | Admitting: Family

## 2020-07-15 DIAGNOSIS — B9689 Other specified bacterial agents as the cause of diseases classified elsewhere: Secondary | ICD-10-CM | POA: Diagnosis not present

## 2020-07-15 DIAGNOSIS — N760 Acute vaginitis: Secondary | ICD-10-CM | POA: Diagnosis not present

## 2020-07-15 MED ORDER — METRONIDAZOLE 500 MG PO TABS: 500.0000 mg | ORAL_TABLET | Freq: Two times a day (BID) | ORAL | 0 refills | Status: DC

## 2020-07-15 NOTE — Progress Notes (Signed)
We are sorry that you are not feeling well. Here is how we plan to help! Based on what you shared with me it looks like you: May have a vaginosis due to bacteria  Vaginosis is an inflammation of the vagina that can result in discharge, itching and pain. The cause is usually a change in the normal balance of vaginal bacteria or an infection. Vaginosis can also result from reduced estrogen levels after menopause.  The most common causes of vaginosis are:   Bacterial vaginosis which results from an overgrowth of one on several organisms that are normally present in your vagina.   Yeast infections which are caused by a naturally occurring fungus called candida.   Vaginal atrophy (atrophic vaginosis) which results from the thinning of the vagina from reduced estrogen levels after menopause.   Trichomoniasis which is caused by a parasite and is commonly transmitted by sexual intercourse.  Factors that increase your risk of developing vaginosis include: Marland Kitchen Medications, such as antibiotics and steroids . Uncontrolled diabetes . Use of hygiene products such as bubble bath, vaginal spray or vaginal deodorant . Douching . Wearing damp or tight-fitting clothing . Using an intrauterine device (IUD) for birth control . Hormonal changes, such as those associated with pregnancy, birth control pills or menopause . Sexual activity . Having a sexually transmitted infection  Your treatment plan is Metronidazole or Flagyl 565m twice a day for 7 days.  I have electronically sent this prescription into the pharmacy that you have chosen.  Be sure to take all of the medication as directed. Stop taking any medication if you develop a rash, tongue swelling or shortness of breath. Mothers who are breast feeding should consider pumping and discarding their breast milk while on these antibiotics. However, there is no consensus that infant exposure at these doses would be harmful.  Remember that medication creams can  weaken latex condoms. .Marland Kitchen  HOME CARE:  Good hygiene may prevent some types of vaginosis from recurring and may relieve some symptoms:  . Avoid baths, hot tubs and whirlpool spas. Rinse soap from your outer genital area after a shower, and dry the area well to prevent irritation. Don't use scented or harsh soaps, such as those with deodorant or antibacterial action. .Marland KitchenAvoid irritants. These include scented tampons and pads. . Wipe from front to back after using the toilet. Doing so avoids spreading fecal bacteria to your vagina.  Other things that may help prevent vaginosis include:  .Marland KitchenDon't douche. Your vagina doesn't require cleansing other than normal bathing. Repetitive douching disrupts the normal organisms that reside in the vagina and can actually increase your risk of vaginal infection. Douching won't clear up a vaginal infection. . Use a latex condom. Both female and female latex condoms may help you avoid infections spread by sexual contact. . Wear cotton underwear. Also wear pantyhose with a cotton crotch. If you feel comfortable without it, skip wearing underwear to bed. Yeast thrives in mCampbell SoupYour symptoms should improve in the next day or two.  GET HELP RIGHT AWAY IF:  . You have pain in your lower abdomen ( pelvic area or over your ovaries) . You develop nausea or vomiting . You develop a fever . Your discharge changes or worsens . You have persistent pain with intercourse . You develop shortness of breath, a rapid pulse, or you faint.  These symptoms could be signs of problems or infections that need to be evaluated by a medical provider now.  MAKE SURE YOU    Understand these instructions.  Will watch your condition.  Will get help right away if you are not doing well or get worse.  Your e-visit answers were reviewed by a board certified advanced clinical practitioner to complete your personal care plan. Depending upon the condition, your plan could  have included both over the counter or prescription medications. Please review your pharmacy choice to make sure that you have choses a pharmacy that is open for you to pick up any needed prescription, Your safety is important to [place]. If you have drug allergies check your prescription carefully.   You can use MyChart to ask questions about today's visit, request a non-urgent call back, or ask for a work or school excuse for 24 hours related to this e-Visit. If it has been greater than 24 hours you will need to follow up with your provider, or enter a new e-Visit to address those concerns. You will get a MyChart message within the next two days asking about your experience. I hope that your e-visit has been valuable and will speed your recovery.   Approximately 5 minutes was spent documenting and reviewing patient's chart.

## 2020-07-17 ENCOUNTER — Telehealth: Payer: Self-pay | Admitting: Gastroenterology

## 2020-07-17 DIAGNOSIS — K5900 Constipation, unspecified: Secondary | ICD-10-CM

## 2020-07-17 DIAGNOSIS — D509 Iron deficiency anemia, unspecified: Secondary | ICD-10-CM

## 2020-07-17 DIAGNOSIS — K603 Anal fistula: Secondary | ICD-10-CM

## 2020-07-17 DIAGNOSIS — L290 Pruritus ani: Secondary | ICD-10-CM

## 2020-07-17 DIAGNOSIS — R933 Abnormal findings on diagnostic imaging of other parts of digestive tract: Secondary | ICD-10-CM

## 2020-07-17 DIAGNOSIS — R109 Unspecified abdominal pain: Secondary | ICD-10-CM

## 2020-07-17 DIAGNOSIS — K50019 Crohn's disease of small intestine with unspecified complications: Secondary | ICD-10-CM

## 2020-07-17 NOTE — Telephone Encounter (Signed)
Patient will get CMP at [facility] and will call [facility] to see about scheduling an appointment in May and we will call her with her results.

## 2020-07-17 NOTE — Telephone Encounter (Signed)
Patient called stating she needs to have labs done prior to appt. Not sure if orders are in Epic.

## 2020-07-23 ENCOUNTER — Ambulatory Visit (HOSPITAL_COMMUNITY): Admission: RE | Admit: 2020-07-23 | Payer: Medicaid Other | Source: Ambulatory Visit

## 2020-08-13 ENCOUNTER — Telehealth: Payer: Self-pay

## 2020-08-13 NOTE — Telephone Encounter (Signed)
-----   Message from , DO sent at 08/12/2020  4:37 PM EDT ----- Regarding: FW: mutual pt Can we please reach out to this patient to let her know that I spoke with Dr. Marcello Moores about her care and she would like to move forward with starting biologic therapy for her Crohns Disease. She has been a no show several times in the clinic, and if a no show again in June, will be formally released from our care. Similarly, if we are to start biologic therapy, this needs to be done as scheduled. Missing doses will lead to antibody formation which is both dangerous and can render the medication useless. Missing treatment appointments will be cause for discontinuation of therapy and will not be able to restart that drug.    With that said, given need to start meds in order to move forward with any surgery, please plan for the following:  - Start Inflectra 5 mg/kg at week 0,2,6 then Q8 weeks per protocol - Keep appt with me as scheduled. Can also look for a sooner appt if any cancellations  Thanks  ----- Message ----- From: Leighton Ruff, MD Sent: 0/14/9969  11:33 AM EDT To: Lavena Bullion, DO Subject: mutual pt                                      She finally came to see me today.  She has some chronic perianal inflammation that could be drained better, but I don't want to do that unless there is a plan in place for treatment.  She is currently not on anything.  We are trying to get her in to see you sooner.  If you can give me a timeline to treatment, I will get her surgery scheduled  Elmo Putt

## 2020-08-13 NOTE — Telephone Encounter (Signed)
Lm on vm for patient to return call 

## 2020-08-13 NOTE — Telephone Encounter (Signed)
Spoke with patient, she was very frustrated and emotional because she just wants Amber Blanchard to proceed with surgery. She states that her issues are cosmetic issues and she does not think it has anything to do with her Crohn's because she had the external symptoms first. I have tried to explain the reasoning behind the biologic therapy and what will be needed from her. She states that she doesn't know how she will maintain her life and she won't have insurance after August. She feels like Amber Blanchard is not listening to her. Patient did not like being on prednisone due to weight gain. She expressed several times that she "doesn't even know what they are trying to put in her" - she is referring to the infusions. Patient states that when she hears infusions she thinks of cancer. Reassured her that this is not the case. Advised that it would be best for her to come in and discuss further with Amber Blanchard. Patient has been scheduled for the next available appt on Thursday, 09/03/20 at 9:40 AM.

## 2020-09-03 ENCOUNTER — Other Ambulatory Visit: Payer: Self-pay

## 2020-09-03 ENCOUNTER — Telehealth: Payer: Self-pay | Admitting: General Surgery

## 2020-09-03 ENCOUNTER — Encounter: Payer: Self-pay | Admitting: Gastroenterology

## 2020-09-03 ENCOUNTER — Ambulatory Visit (INDEPENDENT_AMBULATORY_CARE_PROVIDER_SITE_OTHER): Payer: Medicaid Other | Admitting: Gastroenterology

## 2020-09-03 VITALS — BP 130/80 | HR 80 | Ht 63.0 in | Wt 206.5 lb

## 2020-09-03 DIAGNOSIS — K50019 Crohn's disease of small intestine with unspecified complications: Secondary | ICD-10-CM

## 2020-09-03 DIAGNOSIS — Z7189 Other specified counseling: Secondary | ICD-10-CM | POA: Diagnosis not present

## 2020-09-03 DIAGNOSIS — Z23 Encounter for immunization: Secondary | ICD-10-CM

## 2020-09-03 MED ORDER — MERCAPTOPURINE 50 MG PO TABS: 50.0000 mg | ORAL_TABLET | Freq: Two times a day (BID) | ORAL | 2 refills | Status: AC

## 2020-09-03 NOTE — Patient Instructions (Signed)
If you are age 31 or older, your body mass index should be between 23-30. Your Body mass index is 36.58 kg/m. If this is out of the aforementioned range listed, please consider follow up with your Primary Care Provider.  If you are age 74 or younger, your body mass index should be between 19-25. Your Body mass index is 36.58 kg/m. If this is out of the aformentioned range listed, please consider follow up with your Primary Care Provider.   Due to recent changes in healthcare laws, you may see the results of your imaging and laboratory studies on MyChart before your provider has had a chance to review them.  We understand that in some cases there may be results that are confusing or concerning to you. Not all laboratory results come back in the same time frame and the provider may be waiting for multiple results in order to interpret others.  Please give [place] 48 hours in order for your provider to thoroughly review all the results before contacting the office for clarification of your results.   2nd hep injection scheduled 10/05/2020  Medications will be sent for Prior authorization if you do not hear from [place] within 1 week pease call the office [phone] and ask to speak with [facility], RN  Due to recent changes in healthcare laws, you may see the results of your imaging and laboratory studies on MyChart before your provider has had a chance to review them.  We understand that in some cases there may be results that are confusing or concerning to you. Not all laboratory results come back in the same time frame and the provider may be waiting for multiple results in order to interpret others.  Please give [place] 48 hours in order for your provider to thoroughly review all the results before contacting the office for clarification of your results.   Thank you for choosing me and [facility].  Vito Cirigliano, D.O.

## 2020-09-03 NOTE — Progress Notes (Signed)
Chief Complaint:    Crohn's Disease  GI History: 30 year old female with aggressive phenotypic stricturing and penetrating type Crohn's Disease with upper tract and perianal modifiers, diagnosed 04/2020.   Evaluation to date:  - TPMT: Normal - TB testing: Negative QuantiFERON gold 04/2020 - HBV status: Negative.  Needs hepatitis B vaccine series - Pertinent Imaging: CTE  - Last colonoscopy: 04/2020 - Small bowel imaging: CTE 08/2020: Small rim-enhancing right paramidline anal rectal fistula measuring 5 x 1 cm.  Bilateral inguinal lymphadenopathy, likely reactive.  Changes of chronic Crohn's disease with postinflammatory mesenteric scarring and mild lymphadenopathy.  No current bowel wall thickening or obstruction.  Mild eccentric wall thickening in the lower rectum and a few mildly enlarged mesorectal lymph nodes. - History of EIMs: None  Medications to date: Prednisone  Health Maintenance:  - DEXA: Will schedule 6 months after completion of prednisone (12/2020) - Vaccinations:      - Annual Flu Vaccine - Date needs to obtain through [facility]      - Pneumococcal Vaccine if receiving immunosuppression: Recommend starting PCV 13 if doing biologic therapy      - Zoster vaccine if over age 49: Date N/A - Micronutrient eval:       - Annual Vit D, B6, iron panel: Check at follow-up      - Duodenal disease: calcium, folate: Check at follow-up      - Annual Pap (if immunosuppressed): Obtained through [facility] - Surveillance colonoscopy: Not due - Surveillance labs for immunomodulators: Lab check schedule as below - Annual depression screening: None - Annual Dermatology/Skin exam: Will place referral   Endoscopic History: - EGD (04/2020): Normal esophagus and stomach.  2 stenoses in duodenum (path: Active/chronic inflammation) - Colonoscopy (04/2020): Grade 3 hemorrhoids, perianal fistulae, scars from prior fistulous tracts, severe, nontraversable stenosis in transverse colon (path: Severe  active/chronic inflammatory change), mild sigmoid colitis, moderate colitis of distal rectum (path benign)  HPI:     Patient is a 30 y.o. female presenting to the Gastroenterology Clinic for follow-up.  Initially seen by me in 04/2020 with exam notable for multiple fistulous tracts and severe perianal disease.  EGD/colonoscopy completed that month with severe active Crohn's disease with strictures in the duodenum and a nontraversable stricture in the transverse colon.  She was prescribed prednisone 60 mg/day x2 weeks with slow taper.  She was seen in the Colorectal Surgery clinic by Dr. Marcello Moores in 07/2020 with plan for eventual surgical revision pending discussion of long-term medical management.  She finally presents today for follow-up.  Today, she states she had issues tolerating Prednisone (was feeling emotional) and only took for 2.5-3 weeks. Not sure if had any sxs change when she was taking.  She is otherwise without complaints today.  She saw Dr. Hilliard Clark at [facility] on 08/26/2020.  CT enterography completed on 08/31/2020 and outlined as above.  He also recommends starting biologic therapy with high likelihood for surgery, with consideration of colectomy with ostomy.   Review of systems:     No chest pain, no SOB, no fevers, no urinary sx   Past Medical History:  Diagnosis Date   Anal stricture    Anemia    Cholelithiasis    External hemorrhoids    Genital condyloma, female    HPV in female    Hypertension    [employer] 03/11/2020    Patient's surgical history, family medical history, social history, medications and allergies were all reviewed in Epic    No current outpatient  medications on file.   No current facility-administered medications for this visit.    Physical Exam:     BP 130/80   Pulse 80   Ht 5' 3"  (1.6 m)   Wt 206 lb 8 oz (93.7 kg)   SpO2 98%   BMI 36.58 kg/m   GENERAL:  Pleasant female in NAD PSYCH: : Cooperative, normal affect Musculoskeletal:  Normal  muscle tone, normal strength NEURO: Alert and oriented x 3, no focal neurologic deficits   IMPRESSION and PLAN:    1) Crohn's Disease 30 year old female with aggressive phenotypic stricturing and penetrating type Crohn's Disease with upper tract and perianal modifiers.  Essentially medically nave as she is only received a short course of prednisone for her Crohn's disease.  We discussed the pathophysiology of Crohn's at length today to include treatment options.  Given her aggressive phenotypic disease, I recommended "top-down" dual immunosuppressive therapy with infliximab and 6-mercaptopurine.  We extensively discussed the risks, benefits, alternatives of these medications, and she agrees with this plan.  - Start Inflectra 5 mg/kilogram - Start 6-MP 100 mg/day (~1 mg/kilogram/day) - Start hepatitis B vaccine series today - Needs to start pneumococcal vaccine series - Needs Dermatology referral for annual skin check as we are starting biologic therapy - Annual micronutrient check at follow-up - Follow-up with Dr. Marcello Moores - I discussed the very high likelihood that she will need surgical intervention for what appeared to be fixed, fibrotic strictures along with complex fistulizing disease.  Will need to continue immunosuppressive therapy after surgery - DEXA scan in 12/2020  2) Medication risk discussion - Discussed the risks, benefits and alternatives of biologic therapy with the patient at length, including the risk of lymphoma, non-melanoma skin CA, serious infection (viral hepatitis, reactivation of TB, zoster, fungal infections), allergic reaction, site reaction, psoriasis, demyelinating disease/MS, lupus-like syndrome, neutropenia or heart failure.  - Given the aggressive phenotype, will start dual therapy for a "top down" approach with biologics and immunomodulator. Also explained the reduced immunogenicity benefit with combination therapy as well.  - Pt does not have history of  malignancy in the last 5 years  - Pt does not have an active infection, LTBI or zoster  - Quant Gold checked and negative  - HBV checked and starting vaccine series today - No history of Class III or IV heart failure  - Will need yearly Dermatology referral for skin checks. Recommend sunscreen and hats - No live vaccines while on immunosuppressive therapy - Check CBC weekly x4 weeks, then every 2 weeks x2, then monthly x3, then every 3 months - Check LFTs at week 2, 4, and if normal at week 12, then twice yearly  RTC in 3 months or sooner as needed  I spent 45 minutes of time, including in depth chart review, independent review of results as outlined above, communicating results with the patient directly, face-to-face time with the patient, coordinating care, ordering studies and medications as appropriate, and documentation.          [place] ,DO, FACG 09/03/2020, 10:20 AM

## 2020-09-03 NOTE — Telephone Encounter (Signed)
-----   Message from [place], DO sent at 09/03/2020  1:36 PM EDT ----- A couple things I forgot to review with this patient.  Since we are starting her on 6-MP, need to do the following lab schedule to make sure she is tolerating it just fine: - Check CBC weekly x4 weeks, then every 2 weeks x2, then monthly x3, then every 3 months - Check LFTs at week 2, 4, and if normal at week 12, then twice yearly  Also, looks like she needs pneumococcal vaccine series.  Need to start with PCV 13, then second dose with PPSV23, 2-12 months later.  The vaccine can be done whenever she comes in for labs.  Can we also send in a Dermatology referral for routine yearly skin check since we are starting her on biologic therapy.

## 2020-09-17 ENCOUNTER — Telehealth: Payer: Self-pay | Admitting: Gastroenterology

## 2020-09-17 NOTE — Telephone Encounter (Signed)
Spoke with patient, she is aware of the information below. Advised patient to let us know if she has not heard from [facility] infusion within a week. Patient requested that a message be sent to Dr. Marcello Moores as well so that she is in the loop. Staff message sent to Dr. Leighton Ruff through [place]. Patient verbalized understanding and had no concerns at the end of the call.

## 2020-09-17 NOTE — Telephone Encounter (Signed)
Inbound call from patent requesting a call from a nurse please in regards to prior authorization for infusion injection.

## 2020-09-17 NOTE — Telephone Encounter (Signed)
Lm on vm for patient to return call.   We were awaiting response from pre-cert coordinators to find out where we can send patient for Inflectra. Inflectra orders have been faxed to [facility].

## 2020-09-21 ENCOUNTER — Ambulatory Visit: Payer: Medicaid Other | Admitting: Gastroenterology

## 2020-09-21 ENCOUNTER — Telehealth: Payer: Self-pay | Admitting: Gastroenterology

## 2020-09-21 ENCOUNTER — Telehealth: Payer: Self-pay

## 2020-09-21 ENCOUNTER — Other Ambulatory Visit (INDEPENDENT_AMBULATORY_CARE_PROVIDER_SITE_OTHER): Payer: Medicaid Other

## 2020-09-21 DIAGNOSIS — K50019 Crohn's disease of small intestine with unspecified complications: Secondary | ICD-10-CM

## 2020-09-21 LAB — CBC
HCT: 29.4 % — ABNORMAL LOW (ref 36.0–46.0)
Hemoglobin: 9.3 g/dL — ABNORMAL LOW (ref 12.0–15.0)
MCHC: 31.6 g/dL (ref 30.0–36.0)
MCV: 71.7 fl — ABNORMAL LOW (ref 78.0–100.0)
Platelets: 289 10*3/uL (ref 150.0–400.0)
RBC: 4.1 Mil/uL (ref 3.87–5.11)
RDW: 18 % — ABNORMAL HIGH (ref 11.5–15.5)
WBC: 10 10*3/uL (ref 4.0–10.5)

## 2020-09-21 LAB — HEPATIC FUNCTION PANEL
ALT: 8 U/L (ref 0–35)
AST: 15 U/L (ref 0–37)
Albumin: 4 g/dL (ref 3.5–5.2)
Alkaline Phosphatase: 65 U/L (ref 39–117)
Bilirubin, Direct: 0.1 mg/dL (ref 0.0–0.3)
Total Bilirubin: 0.3 mg/dL (ref 0.2–1.2)
Total Protein: 8.2 g/dL (ref 6.0–8.3)

## 2020-09-21 NOTE — Telephone Encounter (Signed)
Inbound call from patient requesting a call please.  Has questions about getting a referral.  Please advise.

## 2020-09-21 NOTE — Telephone Encounter (Signed)
Spoke with nurse at [employer], I was told that Jinny Blossom, an intake nurse, reached out to patient on Friday but no answer. The patient did return Megan's call but she was unavailable at that time. Jinny Blossom will reach out to patient again this week.

## 2020-09-21 NOTE — Telephone Encounter (Signed)
-----   Message from Yevette Edwards, RN sent at 09/17/2020  1:00 PM EDT ----- Regarding: Inflectra Orders Call [facility] to see if they have been able to get authorization for patient's Inflectra.

## 2020-09-21 NOTE — Telephone Encounter (Signed)
Spoke with patient, she states that she would like to be referred to Dr. Cheryll Cockayne at [facility], he is a Sports administrator. Please advise if OK to proceed. Thanks

## 2020-09-22 NOTE — Telephone Encounter (Signed)
Referral, records, demographic and insurance information faxed to Dr. Cheryll Cockayne at [facility] (P: [phone], F: [phone])

## 2020-09-24 ENCOUNTER — Telehealth: Payer: Self-pay

## 2020-09-24 NOTE — Telephone Encounter (Signed)
Received a fax from [facility]. Patient is scheduled for her first Inflectra infusion on Thursday, 10/01/20.

## 2020-10-02 ENCOUNTER — Telehealth: Payer: Self-pay | Admitting: Gastroenterology

## 2020-10-02 DIAGNOSIS — Z7189 Other specified counseling: Secondary | ICD-10-CM

## 2020-10-02 DIAGNOSIS — K50019 Crohn's disease of small intestine with unspecified complications: Secondary | ICD-10-CM

## 2020-10-02 DIAGNOSIS — D509 Iron deficiency anemia, unspecified: Secondary | ICD-10-CM

## 2020-10-02 NOTE — Telephone Encounter (Signed)
Dr. Loletha Grayer, see note below.   Based on 09/21/20 lab results the plan is to:  -Continue checking LFTs in 2 weeks , and if still normal at week 12, then twice yearly given recent initiation of Crohn's therapy -Check iron panel along with B12, folate, vitamin D - continue weekly CBC checks, and if stable check every 2 weeks x 2, thenmonthly x3 then every 3 months given initiation of Crohn's therapy  I do not see where patient came in for CBC this week. So she will need CBC early next week, correct? LFT's will be due the week of 10/14/20, would you like iron panel, B12, folate, and Vitamin D to be drawn at that time or before then? Please advise, thanks.

## 2020-10-02 NOTE — Telephone Encounter (Signed)
Inbound call from patient. Have questions about how often she is to have labs?   She also wanted to let it known that she had first inflectra infusion and feel better.

## 2020-10-03 NOTE — Telephone Encounter (Signed)
Yes, will need CBC ealy next week, and can send iron panel, B12, folate, Vit D with that lab draw. Please remind patient the importance of medical compliance, which includes regular , schedule lab draws while we are initiating immunomodulator therapy. Thanks.

## 2020-10-05 ENCOUNTER — Ambulatory Visit (INDEPENDENT_AMBULATORY_CARE_PROVIDER_SITE_OTHER): Payer: Medicaid Other | Admitting: Gastroenterology

## 2020-10-05 ENCOUNTER — Other Ambulatory Visit: Payer: Self-pay

## 2020-10-05 DIAGNOSIS — Z23 Encounter for immunization: Secondary | ICD-10-CM | POA: Diagnosis not present

## 2020-10-05 NOTE — Telephone Encounter (Signed)
Lm on vm for patient to return call.   Lab orders in epic for CBC, iron panel, B12, folate, and Vitamin D.

## 2020-10-06 NOTE — Progress Notes (Signed)
Patient presents for hepatitis B injection.  Please see nursing notes for details.

## 2020-10-07 ENCOUNTER — Telehealth: Payer: Self-pay

## 2020-10-07 ENCOUNTER — Other Ambulatory Visit: Payer: Self-pay

## 2020-10-07 DIAGNOSIS — D509 Iron deficiency anemia, unspecified: Secondary | ICD-10-CM

## 2020-10-07 DIAGNOSIS — K50019 Crohn's disease of small intestine with unspecified complications: Secondary | ICD-10-CM

## 2020-10-07 NOTE — Telephone Encounter (Signed)
Patient spoke to nurse regarding everything

## 2020-10-07 NOTE — Telephone Encounter (Signed)
LVM for patient to call regarding her results and lab work and how to get her back on track

## 2020-10-07 NOTE — Telephone Encounter (Signed)
Spoke with patient in regards to lab results and recommendations. Patient states that she will be able to come in for labs tomorrow and can come next week on Tuesday. Advised that we will call her with the results and recommendations. Patient states that she also would like to begin Pneumococcal vaccine series when she comes in for her follow up in August. Patient verbalized understanding of all information and had no concerns at the end of the call.

## 2020-10-13 ENCOUNTER — Other Ambulatory Visit (INDEPENDENT_AMBULATORY_CARE_PROVIDER_SITE_OTHER): Payer: Medicaid Other

## 2020-10-13 ENCOUNTER — Telehealth: Payer: Self-pay

## 2020-10-13 DIAGNOSIS — K50019 Crohn's disease of small intestine with unspecified complications: Secondary | ICD-10-CM

## 2020-10-13 DIAGNOSIS — D509 Iron deficiency anemia, unspecified: Secondary | ICD-10-CM

## 2020-10-13 DIAGNOSIS — Z7189 Other specified counseling: Secondary | ICD-10-CM | POA: Diagnosis not present

## 2020-10-13 DIAGNOSIS — R933 Abnormal findings on diagnostic imaging of other parts of digestive tract: Secondary | ICD-10-CM

## 2020-10-13 LAB — HEPATIC FUNCTION PANEL
ALT: 12 U/L (ref 0–35)
AST: 19 U/L (ref 0–37)
Albumin: 3.9 g/dL (ref 3.5–5.2)
Alkaline Phosphatase: 57 U/L (ref 39–117)
Bilirubin, Direct: 0 mg/dL (ref 0.0–0.3)
Total Bilirubin: 0.4 mg/dL (ref 0.2–1.2)
Total Protein: 7.7 g/dL (ref 6.0–8.3)

## 2020-10-13 LAB — IRON,TIBC AND FERRITIN PANEL
%SAT: 5 % (calc) — ABNORMAL LOW (ref 16–45)
Ferritin: 3 ng/mL — ABNORMAL LOW (ref 16–154)
Iron: 21 ug/dL — ABNORMAL LOW (ref 40–190)
TIBC: 391 mcg/dL (calc) (ref 250–450)

## 2020-10-13 LAB — CBC
HCT: 28.9 % — ABNORMAL LOW (ref 36.0–46.0)
Hemoglobin: 9.1 g/dL — ABNORMAL LOW (ref 12.0–15.0)
MCHC: 31.6 g/dL (ref 30.0–36.0)
MCV: 72.6 fl — ABNORMAL LOW (ref 78.0–100.0)
Platelets: 299 10*3/uL (ref 150.0–400.0)
RBC: 3.98 Mil/uL (ref 3.87–5.11)
RDW: 18.1 % — ABNORMAL HIGH (ref 11.5–15.5)
WBC: 7.4 10*3/uL (ref 4.0–10.5)

## 2020-10-13 LAB — VITAMIN B12: Vitamin B-12: 160 pg/mL — ABNORMAL LOW (ref 211–911)

## 2020-10-13 LAB — VITAMIN D 25 HYDROXY (VIT D DEFICIENCY, FRACTURES): VITD: 13.41 ng/mL — ABNORMAL LOW (ref 30.00–100.00)

## 2020-10-13 LAB — FOLATE: Folate: 8.7 ng/mL (ref >5.9)

## 2020-10-13 NOTE — Telephone Encounter (Signed)
Left detailed message on patient's vm letting her know that she is due for repeat labs this week to recheck her liver enzymes. She has been advised that she is overdue for the CBC, iron, B12, folate, and vitamin D. Advised patient that it is very important that she comes in for labs when needed since she has been started on Inflectra. Advised patient to give [place] a call back if she has any questions or concerns.

## 2020-10-13 NOTE — Telephone Encounter (Signed)
-----   Message from Yevette Edwards, RN sent at 10/05/2020 10:43 AM EDT ----- Regarding: Labs Repeat LFT's, need to enter orders.

## 2020-10-14 ENCOUNTER — Other Ambulatory Visit: Payer: Self-pay

## 2020-10-14 DIAGNOSIS — D509 Iron deficiency anemia, unspecified: Secondary | ICD-10-CM

## 2020-10-14 DIAGNOSIS — E538 Deficiency of other specified B group vitamins: Secondary | ICD-10-CM

## 2020-10-14 DIAGNOSIS — E559 Vitamin D deficiency, unspecified: Secondary | ICD-10-CM

## 2020-10-14 MED ORDER — VITAMIN D 50 MCG (2000 UT) PO CAPS: 2000.0000 [IU] | ORAL_CAPSULE | Freq: Every day | ORAL | 3 refills | Status: DC

## 2020-10-14 MED ORDER — VITAMIN D (ERGOCALCIFEROL) 1.25 MG (50000 UNIT) PO CAPS: 50000.0000 [IU] | ORAL_CAPSULE | ORAL | 0 refills | Status: DC

## 2020-10-14 MED ORDER — CYANOCOBALAMIN 1000 MCG/ML IJ SOLN: 1000.0000 ug | INTRAMUSCULAR | 0 refills | Status: DC

## 2020-10-14 NOTE — Telephone Encounter (Signed)
Called patient and gave lab results. Made referral to Hematology for IV iron infusions. Sent orders to patient's pharmacy for: [id number] IM injections. Ergocalciferol-50,000units PO weekly for 8 weeks. Also Vit D-2000 units daily (to be started after 50,000 units is finished). 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 Instructions given on taking all the above. Staff reminder for labs in 3 monrhs

## 2020-10-14 NOTE — Telephone Encounter (Signed)
Patient calling back requesting lab results please.

## 2020-10-14 NOTE — Telephone Encounter (Signed)
Dr. Bryan Lemma, please see note below. Thanks

## 2020-10-14 NOTE — Telephone Encounter (Addendum)
.  A user error has taken place: error

## 2020-10-15 ENCOUNTER — Telehealth: Payer: Self-pay | Admitting: Hematology and Oncology

## 2020-10-15 ENCOUNTER — Telehealth: Payer: Self-pay | Admitting: Gastroenterology

## 2020-10-15 NOTE — Telephone Encounter (Signed)
Scheduled appt per 7/20 referral. Pt aware.

## 2020-10-16 NOTE — Telephone Encounter (Signed)
Patient reports she spoke with Lanny Hurst, CMA and all her concerns were addressed

## 2020-10-27 ENCOUNTER — Inpatient Hospital Stay: Payer: Medicaid Other

## 2020-10-27 ENCOUNTER — Inpatient Hospital Stay: Payer: Medicaid Other | Admitting: Hematology and Oncology

## 2020-10-28 ENCOUNTER — Telehealth: Payer: Self-pay | Admitting: Hematology and Oncology

## 2020-10-28 NOTE — Telephone Encounter (Signed)
Ms. Amber Blanchard has been rescheduled to see Dr. Chryl Heck on 8/18 at 10:40am. Pt aware to arrive 20 minutes early.

## 2020-10-29 ENCOUNTER — Other Ambulatory Visit: Payer: Self-pay

## 2020-10-29 ENCOUNTER — Ambulatory Visit (INDEPENDENT_AMBULATORY_CARE_PROVIDER_SITE_OTHER): Payer: Medicaid Other | Admitting: Gastroenterology

## 2020-10-29 ENCOUNTER — Encounter: Payer: Self-pay | Admitting: Gastroenterology

## 2020-10-29 VITALS — BP 150/92 | HR 80 | Ht 63.0 in | Wt 212.5 lb

## 2020-10-29 DIAGNOSIS — K50919 Crohn's disease, unspecified, with unspecified complications: Secondary | ICD-10-CM

## 2020-10-29 DIAGNOSIS — D509 Iron deficiency anemia, unspecified: Secondary | ICD-10-CM | POA: Diagnosis not present

## 2020-10-29 DIAGNOSIS — E559 Vitamin D deficiency, unspecified: Secondary | ICD-10-CM

## 2020-10-29 DIAGNOSIS — K315 Obstruction of duodenum: Secondary | ICD-10-CM

## 2020-10-29 DIAGNOSIS — K603 Anal fistula: Secondary | ICD-10-CM

## 2020-10-29 DIAGNOSIS — E538 Deficiency of other specified B group vitamins: Secondary | ICD-10-CM | POA: Diagnosis not present

## 2020-10-29 DIAGNOSIS — K56699 Other intestinal obstruction unspecified as to partial versus complete obstruction: Secondary | ICD-10-CM

## 2020-10-29 NOTE — Progress Notes (Signed)
Chief Complaint:    Crohn's Disease  GI History: 30 year old female with aggressive phenotypic stricturing and penetrating type Crohn's Disease with upper tract and perianal modifiers, diagnosed 04/2020 (severe perianal disease with multiple perianal fistulous tracts, nontraversable stricture in transverse colon, duodenal strictures).   Evaluation to date: - TPMT: Normal - TB testing: Negative QuantiFERON gold 04/2020 - HBV status: Negative.  Started HBV vaccine series 09/2020 - Pertinent Imaging: CTE  - Last colonoscopy: 04/2020 - Small bowel imaging: CTE 08/2020: Small rim-enhancing right paramidline anal rectal fistula measuring 5 x 1 cm.  Bilateral inguinal lymphadenopathy, likely reactive.  Changes of chronic Crohn's disease with postinflammatory mesenteric scarring and mild lymphadenopathy.  No current bowel wall thickening or obstruction.  Mild eccentric wall thickening in the lower rectum and a few mildly enlarged mesorectal lymph nodes. - History of EIMs: None   Medications to date: Prednisone, Inflectra Current medications: Inflectra   Health Maintenance: - DEXA: Will schedule 6 months after completion of prednisone (12/2020) - Vaccinations:      - Annual Flu Vaccine - Will obtain through [facility] in fall 2022      - Pneumococcal Vaccine if receiving immunosuppression: Giving PCV 13 today.  Second dose to be given by [facility] (not stocked here)      - Zoster vaccine if over age 32: Date N/A - Micronutrient eval:      - Annual Vit D, B6, iron panel: UTD      - Duodenal disease: calcium, folate: UTD      - Annual Pap (if immunosuppressed): Obtained through [facility] - Surveillance colonoscopy: Not due - Surveillance labs for immunomodulators: Lab check schedule as below - Annual depression screening: None - Annual Dermatology/Skin exam: Referral previously placed     Endoscopic History: - EGD (04/2020): Normal esophagus and stomach.  2 stenoses in duodenum (path: Active/chronic  inflammation) - Colonoscopy (04/2020): Grade 3 hemorrhoids, perianal fistulae, scars from prior fistulous tracts, severe, nontraversable stenosis in transverse colon (path: Severe active/chronic inflammatory change), mild sigmoid colitis, moderate colitis of distal rectum (path benign)  HPI:     Patient is a 30 y.o. female presenting to the Gastroenterology Clinic for follow-up.  Last seen by me on 09/03/2020.  She has been evaluated by colorectal surgery at [facility] and [facility], with plan to continue following with Dr. Morton Stall at [facility], with appointment scheduled in September.  She states she is feeling good.  Tolerated first 2 Inflectra infusions without issue.  Third infusion scheduled for 11/12/2020. Tolerating PO without issue.  No recent drainage from fistulas. Improved energy.   Labs from 10/13/2020 again reviewed and notable for the following: - Normal liver enzymes, normal folate - Iron deficiency anemia-referred to Hematology for IV iron.  Appointment on 8/18 - B12 deficiency-started on B12 injections - Vitamin D deficiency, started on ergocalciferol  Review of systems:     No chest pain, no SOB, no fevers, no urinary sx   Past Medical History:  Diagnosis Date   Anal stricture    Anemia    Cholelithiasis    External hemorrhoids    Genital condyloma, female    HPV in female    Hypertension    Phreesia 03/11/2020    Patient's surgical history, family medical history, social history, medications and allergies were all reviewed in Epic    Current Outpatient Medications  Medication Sig Dispense Refill   Cholecalciferol (VITAMIN D) 50 MCG (2000 UT) CAPS Take 1 capsule (2,000 Units total) by mouth daily. Start AFTER finished  with Ergocalciferol 50,000 units 30 capsule 3   cyanocobalamin (,VITAMIN B-12,) 1000 MCG/ML injection Inject 1 mL (1,000 mcg total) into the muscle once a week. Inject once weekly x 4 weeks, Then once a month x 5 months 9 mL 0   inFLIXimab-dyyb (INFLECTRA IV) Inject 5  mg into the vein. Taken at [facility] infusion     Vitamin D, Ergocalciferol, (DRISDOL) 1.25 MG (50000 UNIT) CAPS capsule Take 1 capsule (50,000 Units total) by mouth every 7 (seven) days. Take for 8 weeks. Then start Vit D-2000 units daily 8 capsule 0   No current facility-administered medications for this visit.    Physical Exam:     BP (!) 150/92   Pulse 80   Ht 5' 3"  (1.6 m)   Wt 212 lb 8 oz (96.4 kg)   SpO2 99%   BMI 37.64 kg/m   GENERAL:  Pleasant female in NAD PSYCH: : Cooperative, normal affect Musculoskeletal:  Normal muscle tone, normal strength NEURO: Alert and oriented x 3, no focal neurologic deficits   IMPRESSION and PLAN:    1) Crohn's Disease 2) Transverse colon stricture 3) Duodenal strictures 4) Perianal fistulous 30 year old female with aggressive phenotypic stricturing and penetrating type Crohn's Disease with upper tract and perianal modifiers and multiple vitamin deficiencies.   -Continue Inflectra as scheduled - Complete hepatitis B vaccine series as scheduled - Pneumococcal vaccine today with second dose at [facility] office (PPSV23 not stocked here) - To follow-up with Dr. Morton Stall at [facility] in September as planned - Check infliximab trough level prior to fourth dose for proactive monitoring - DEXA scan in 12/2020 - Colonoscopy in 08/2020 - Not taking 6-MP  5) Vitamin D deficiency - Continue ergocalciferol  6) B12 deficiency - Continue B12 injections  7) Iron deficiency - Referral in place for IV iron with Hematology    I spent 32 minutes of time, including in depth chart review, independent review of results as outlined above, communicating results with the patient directly, face-to-face time with the patient, coordinating care, and ordering studies and medications as appropriate, and documentation.    Lavena Bullion ,DO, FACG 10/29/2020, 9:42 AM

## 2020-10-29 NOTE — Patient Instructions (Signed)
If you are age 31 or older, your body mass index should be between 23-30. Your Body mass index is 37.64 kg/m. If this is out of the aforementioned range listed, please consider follow up with your Primary Care Provider.  If you are age 37 or younger, your body mass index should be between 19-25. Your Body mass index is 37.64 kg/m. If this is out of the aformentioned range listed, please consider follow up with your Primary Care Provider.   Due to recent changes in healthcare laws, you may see the results of your imaging and laboratory studies on MyChart before your provider has had a chance to review them.  We understand that in some cases there may be results that are confusing or concerning to you. Not all laboratory results come back in the same time frame and the provider may be waiting for multiple results in order to interpret others.  Please give [place] 48 hours in order for your provider to thoroughly review all the results before contacting the office for clarification of your results.   Please have your levels drawn the day before your next infusion. Your orders have been placed and you may go to the lab at [facility] [address] you will go to the basement level.   Due to recent changes in healthcare laws, you may see the results of your imaging and laboratory studies on MyChart before your provider has had a chance to review them.  We understand that in some cases there may be results that are confusing or concerning to you. Not all laboratory results come back in the same time frame and the provider may be waiting for multiple results in order to interpret others.  Please give [place] 48 hours in order for your provider to thoroughly review all the results before contacting the office for clarification of your results.   Thank you for choosing me and [facility].  Vito Cirigliano, D.O.

## 2020-11-12 ENCOUNTER — Encounter: Payer: Medicaid Other | Admitting: Hematology and Oncology

## 2020-11-12 ENCOUNTER — Other Ambulatory Visit: Payer: Medicaid Other

## 2020-11-13 ENCOUNTER — Telehealth: Payer: Medicaid Other | Admitting: Physician Assistant

## 2020-11-13 DIAGNOSIS — N760 Acute vaginitis: Secondary | ICD-10-CM | POA: Diagnosis not present

## 2020-11-13 DIAGNOSIS — B9689 Other specified bacterial agents as the cause of diseases classified elsewhere: Secondary | ICD-10-CM

## 2020-11-14 ENCOUNTER — Encounter: Payer: Self-pay | Admitting: Physician Assistant

## 2020-11-14 MED ORDER — METRONIDAZOLE 500 MG PO TABS: 500.0000 mg | ORAL_TABLET | Freq: Two times a day (BID) | ORAL | 0 refills | Status: AC

## 2020-11-14 NOTE — Progress Notes (Signed)
E-Visit for Vaginal Symptoms  We are sorry that you are not feeling well. Here is how we plan to help! Based on what you shared with me it looks like you: May have a vaginosis due to bacteria  Vaginosis is an inflammation of the vagina that can result in discharge, itching and pain. The cause is usually a change in the normal balance of vaginal bacteria or an infection. Vaginosis can also result from reduced estrogen levels after menopause.  The most common causes of vaginosis are:   Bacterial vaginosis which results from an overgrowth of one on several organisms that are normally present in your vagina.   Yeast infections which are caused by a naturally occurring fungus called candida.   Vaginal atrophy (atrophic vaginosis) which results from the thinning of the vagina from reduced estrogen levels after menopause.   Trichomoniasis which is caused by a parasite and is commonly transmitted by sexual intercourse.  Factors that increase your risk of developing vaginosis include: Medications, such as antibiotics and steroids Uncontrolled diabetes Use of hygiene products such as bubble bath, vaginal spray or vaginal deodorant Douching Wearing damp or tight-fitting clothing Using an intrauterine device (IUD) for birth control Hormonal changes, such as those associated with pregnancy, birth control pills or menopause Sexual activity Having a sexually transmitted infection  Your treatment plan is Metronidazole or Flagyl 574m twice a day for 7 days.  I have electronically sent this prescription into the pharmacy that you have chosen.  Amber Blanchard  To try and prevent getting a yeast infection from the prescribed antibiotic, take a probiotic daily while on the antibiotic. If youdo end up developing a yeast infection, please follow up with your doctor or submit an Evisit. Hope you feel better soon!  Be sure to take all of the medication as directed. Stop taking any medication if you develop a  rash, tongue swelling or shortness of breath. Mothers who are breast feeding should consider pumping and discarding their breast milk while on these antibiotics. However, there is no consensus that infant exposure at these doses would be harmful.  Remember that medication creams can weaken latex condoms. .Marland Kitchen  HOME CARE:  Good hygiene may prevent some types of vaginosis from recurring and may relieve some symptoms:  Avoid baths, hot tubs and whirlpool spas. Rinse soap from your outer genital area after a shower, and dry the area well to prevent irritation. Don't use scented or harsh soaps, such as those with deodorant or antibacterial action. Avoid irritants. These include scented tampons and pads. Wipe from front to back after using the toilet. Doing so avoids spreading fecal bacteria to your vagina.  Other things that may help prevent vaginosis include:  Don't douche. Your vagina doesn't require cleansing other than normal bathing. Repetitive douching disrupts the normal organisms that reside in the vagina and can actually increase your risk of vaginal infection. Douching won't clear up a vaginal infection. Use a latex condom. Both female and female latex condoms may help you avoid infections spread by sexual contact. Wear cotton underwear. Also wear pantyhose with a cotton crotch. If you feel comfortable without it, skip wearing underwear to bed. Yeast thrives in mCampbell SoupYour symptoms should improve in the next day or two.  GET HELP RIGHT AWAY IF:  You have pain in your lower abdomen ( pelvic area or over your ovaries) You develop nausea or vomiting You develop a fever Your discharge changes or worsens You have persistent pain with intercourse You  develop shortness of breath, a rapid pulse, or you faint.  These symptoms could be signs of problems or infections that need to be evaluated by a medical provider now.  MAKE SURE YOU   Understand these instructions. Will watch  your condition. Will get help right away if you are not doing well or get worse.  Thank you for choosing an e-visit.  Your e-visit answers were reviewed by a board certified advanced clinical practitioner to complete your personal care plan. Depending upon the condition, your plan could have included both over the counter or prescription medications.  Please review your pharmacy choice. Make sure the pharmacy is open so you can pick up prescription now. If there is a problem, you may contact your provider through [employer] and have the prescription routed to another pharmacy.  Your safety is important to [place]. If you have drug allergies check your prescription carefully.   For the next 24 hours you can use MyChart to ask questions about today's visit, request a non-urgent call back, or ask for a work or school excuse. You will get an email in the next two days asking about your experience. I hope that your e-visit has been valuable and will speed your recovery. I spent 5-10 minutes on review and completion of this note- Lacy Duverney [facility]

## 2020-11-18 ENCOUNTER — Telehealth: Payer: Self-pay | Admitting: Gastroenterology

## 2020-11-18 ENCOUNTER — Telehealth: Payer: Medicaid Other | Admitting: Nurse Practitioner

## 2020-11-18 DIAGNOSIS — B3731 Acute candidiasis of vulva and vagina: Secondary | ICD-10-CM

## 2020-11-18 DIAGNOSIS — B373 Candidiasis of vulva and vagina: Secondary | ICD-10-CM

## 2020-11-18 MED ORDER — FLUCONAZOLE 150 MG PO TABS: 150.0000 mg | ORAL_TABLET | Freq: Once | ORAL | 0 refills | Status: AC

## 2020-11-18 NOTE — Progress Notes (Signed)

## 2020-11-18 NOTE — Telephone Encounter (Signed)
Pt states that she does not feel well since her B12 was changed from 1 weekly to 1 monthly. She is asking if she can go back to once a week. Pls call her.

## 2020-11-18 NOTE — Telephone Encounter (Signed)
Spoke with patient, she states that last Friday was her first monthly dose of B12. She states that she is back to feeling fatigued and groggy. Pt is wondering if she can go back to the B12 injections weekly or maybe every 2 weeks. Patient states that everything else with Genene Churn is doing fine. Please advise, thanks

## 2020-11-19 MED ORDER — CYANOCOBALAMIN 1000 MCG/ML IJ SOLN: 1000.0000 ug | INTRAMUSCULAR | 0 refills | Status: DC

## 2020-11-19 NOTE — Telephone Encounter (Signed)
Spoke with patient in regards to recommendations. Pt is aware that new RX has been sent to her pharmacy on file. She is aware that we will call her in regards to repeat labs. Pt verbalized understanding and had no concerns at the end of the call.

## 2020-11-19 NOTE — Progress Notes (Signed)
Sent response

## 2020-11-19 NOTE — Telephone Encounter (Signed)
We can do every other week B12 injections for the next 3 months, then recheck B12 level with plan to move to monthly injections if needed while her Inflectra continues to ramp up.  Thanks.

## 2020-12-03 ENCOUNTER — Inpatient Hospital Stay: Payer: Medicaid Other | Admitting: Hematology and Oncology

## 2020-12-03 ENCOUNTER — Telehealth: Payer: Self-pay | Admitting: Hematology and Oncology

## 2020-12-03 ENCOUNTER — Inpatient Hospital Stay: Payer: Medicaid Other

## 2020-12-03 NOTE — Telephone Encounter (Signed)
Scheduled per sch msg. Called and left msg  

## 2020-12-07 ENCOUNTER — Telehealth: Payer: Self-pay | Admitting: Gastroenterology

## 2020-12-07 DIAGNOSIS — R197 Diarrhea, unspecified: Secondary | ICD-10-CM

## 2020-12-07 DIAGNOSIS — K50919 Crohn's disease, unspecified, with unspecified complications: Secondary | ICD-10-CM

## 2020-12-07 NOTE — Telephone Encounter (Signed)
Spoke with patient, she states that her last Inflectra infusion was on 11/12/20. Pt states that she started having symptoms on 12/05/20. Pt is wondering if we can do labs now to check her Inflectra levels, she said that she can wait if that is what you recommend. Pt reports that she had diarrhea, the sense of urgency to have a BM but couldn't go, abdominal pain, and she states that she would get sweats in the shower. She reports that the Inflectra usually helps with all of these symptoms. Pt is aware that you are out of the office until Thursday this week and we will give her a call back with your recommendations. Pt verbalized understanding and had no concerns at the end of the call.

## 2020-12-07 NOTE — Telephone Encounter (Signed)
Pls call pt, she would like to speak with you about some lab work that she wants to have.

## 2020-12-09 ENCOUNTER — Encounter: Payer: Medicaid Other | Admitting: Hematology and Oncology

## 2020-12-09 ENCOUNTER — Other Ambulatory Visit: Payer: Medicaid Other

## 2020-12-10 NOTE — Telephone Encounter (Signed)
Based on those symptoms, plan for the following:  - Check ESR, CRP, fecal calprotectin, GI PCR panel now - Check infliximab trough and antibody level about 1 week prior to the next scheduled infusion

## 2020-12-10 NOTE — Telephone Encounter (Signed)
Spoke with patient in regards to recommendations. Pt states that she feels like these test are unnecessary and having to collect the stool samples is nasty because she had these symptoms before Inflectra but she will do them. She states that we already know that her colon is inflamed. Pt states that she will come to the [place] lab to complete labs and stool studies. She has also been advised that she will need to have trough and antibody labs drawn 1 week prior to her next Inflectra infusion. Pt verbalized understanding.   Lab orders in epic.

## 2020-12-14 ENCOUNTER — Other Ambulatory Visit (INDEPENDENT_AMBULATORY_CARE_PROVIDER_SITE_OTHER): Payer: Medicaid Other

## 2020-12-14 DIAGNOSIS — K50919 Crohn's disease, unspecified, with unspecified complications: Secondary | ICD-10-CM

## 2020-12-14 DIAGNOSIS — R197 Diarrhea, unspecified: Secondary | ICD-10-CM

## 2020-12-14 LAB — HIGH SENSITIVITY CRP: CRP, High Sensitivity: 2.98 mg/L (ref 0.000–5.000)

## 2020-12-14 LAB — SEDIMENTATION RATE: Sed Rate: 102 mm/hr — ABNORMAL HIGH (ref 0–20)

## 2020-12-24 ENCOUNTER — Telehealth: Payer: Self-pay | Admitting: Gastroenterology

## 2020-12-24 NOTE — Telephone Encounter (Signed)
Inbound call from patient. Would like a call back to know what labs she is needing before infusion.

## 2020-12-24 NOTE — Telephone Encounter (Signed)
Pt called to inform us that her next Inflectra infusion is on 01/07/21. Pt will come in for labs on 12/31/20. Pt reports that she is also feeling better from when she called in last week. She states that nothing was wrong, she thinks it was just her menstrual cycle. Pt had no concerns at the end of the call.

## 2020-12-30 ENCOUNTER — Telehealth: Payer: Self-pay

## 2020-12-30 DIAGNOSIS — E559 Vitamin D deficiency, unspecified: Secondary | ICD-10-CM

## 2020-12-30 DIAGNOSIS — D509 Iron deficiency anemia, unspecified: Secondary | ICD-10-CM

## 2020-12-30 DIAGNOSIS — E538 Deficiency of other specified B group vitamins: Secondary | ICD-10-CM

## 2020-12-30 DIAGNOSIS — K50919 Crohn's disease, unspecified, with unspecified complications: Secondary | ICD-10-CM

## 2020-12-30 NOTE — Telephone Encounter (Signed)
-----   Message from Yevette Edwards, RN sent at 12/24/2020  3:37 PM EDT ----- Regarding: labs Inflectra AB and trough level on 10/6. Next infusion is 10/13

## 2020-12-30 NOTE — Telephone Encounter (Signed)
Left detailed message on patient's vm to remind her that she is due for repeat labs tomorrow, which is approximately 1 week prior to her next infusion. Advised that no appointment is necessary. Advised that she can stop by the lab in the basement at her convenience between 7:30 AM - 5 PM tomorrow. Advised patient to call back if she had any concerns.

## 2020-12-31 ENCOUNTER — Other Ambulatory Visit (INDEPENDENT_AMBULATORY_CARE_PROVIDER_SITE_OTHER): Payer: Medicaid Other

## 2020-12-31 DIAGNOSIS — K50919 Crohn's disease, unspecified, with unspecified complications: Secondary | ICD-10-CM

## 2020-12-31 DIAGNOSIS — D509 Iron deficiency anemia, unspecified: Secondary | ICD-10-CM | POA: Diagnosis not present

## 2020-12-31 DIAGNOSIS — E559 Vitamin D deficiency, unspecified: Secondary | ICD-10-CM | POA: Diagnosis not present

## 2020-12-31 DIAGNOSIS — E538 Deficiency of other specified B group vitamins: Secondary | ICD-10-CM | POA: Diagnosis not present

## 2020-12-31 LAB — IBC PANEL
Iron: 14 ug/dL — ABNORMAL LOW (ref 42–145)
Saturation Ratios: 3.4 % — ABNORMAL LOW (ref 20.0–50.0)
TIBC: 407.4 ug/dL (ref 250.0–450.0)
Transferrin: 291 mg/dL (ref 212.0–360.0)

## 2020-12-31 LAB — CBC WITH DIFFERENTIAL/PLATELET
Basophils Absolute: 0 10*3/uL (ref 0.0–0.1)
Basophils Relative: 0.3 % (ref 0.0–3.0)
Eosinophils Absolute: 0.1 10*3/uL (ref 0.0–0.7)
Eosinophils Relative: 1.1 % (ref 0.0–5.0)
HCT: 28.7 % — ABNORMAL LOW (ref 36.0–46.0)
Hemoglobin: 8.8 g/dL — ABNORMAL LOW (ref 12.0–15.0)
Lymphocytes Relative: 30.4 % (ref 12.0–46.0)
Lymphs Abs: 2.2 10*3/uL (ref 0.7–4.0)
MCHC: 30.7 g/dL (ref 30.0–36.0)
MCV: 74.8 fl — ABNORMAL LOW (ref 78.0–100.0)
Monocytes Absolute: 0.7 10*3/uL (ref 0.1–1.0)
Monocytes Relative: 9.9 % (ref 3.0–12.0)
Neutro Abs: 4.3 10*3/uL (ref 1.4–7.7)
Neutrophils Relative %: 58.3 % (ref 43.0–77.0)
Platelets: 312 10*3/uL (ref 150.0–400.0)
RBC: 3.84 Mil/uL — ABNORMAL LOW (ref 3.87–5.11)
RDW: 17.9 % — ABNORMAL HIGH (ref 11.5–15.5)
WBC: 7.3 10*3/uL (ref 4.0–10.5)

## 2020-12-31 LAB — FERRITIN: Ferritin: 3.4 ng/mL — ABNORMAL LOW (ref 10.0–291.0)

## 2020-12-31 LAB — VITAMIN D 25 HYDROXY (VIT D DEFICIENCY, FRACTURES): VITD: 11.32 ng/mL — ABNORMAL LOW (ref 30.00–100.00)

## 2020-12-31 LAB — VITAMIN B12: Vitamin B-12: 362 pg/mL (ref 211–911)

## 2021-01-05 ENCOUNTER — Telehealth: Payer: Self-pay | Admitting: Gastroenterology

## 2021-01-05 DIAGNOSIS — E559 Vitamin D deficiency, unspecified: Secondary | ICD-10-CM

## 2021-01-05 NOTE — Telephone Encounter (Signed)
Pt would like to speak with you about lab results and her next infusion.

## 2021-01-05 NOTE — Telephone Encounter (Signed)
Please advise on lab results. I see we are still waiting on Infliximab levels.

## 2021-01-06 NOTE — Telephone Encounter (Signed)
Labs are notable for continued iron deficiency anemia and vitamin D deficiency.  Vitamin B12 has improved, now normal at 362.  Infliximab labs are still pending.  Plan for the following:   -Continue B12 injections as scheduled -Referral to Endocrinology for assistance with persistent vitamin D deficiency despite ergocalciferol -I do not see that she was ever seen in the Hematology clinic to start IV iron.  Can we please coordinate -Will follow-up on pending infliximab trough/antibody

## 2021-01-06 NOTE — Telephone Encounter (Signed)
Please see separate note and labs section.  Awaiting infliximab trough/antibody.  Continued iron deficiency anemia and needs referral to hematology for IV iron (I do not see that that has been done yet).  Also referral to Endocrinology for assistance with persistent vitamin D deficiency despite ergocalciferol.  Thank you.

## 2021-01-06 NOTE — Telephone Encounter (Signed)
Spoke with patient in regards to lab results and recommendations as outlined below. Referral to Hematology was placed back on 10/14/20, pt was scheduled but cancelled appt. Pt states that she will contact Hematology to set up IV iron once she gets her Inflectra infusions on an updated schedule. Advised patient to proceed with infusion tomorrow and Dr. Bryan Lemma will further advise once we have Infliximab levels back. Pt is aware that we have placed referral to endocrinology and they will contact her directly to set up her appt. Pt verbalized understanding and had no other concerns at the end of the call.   Ambulatory referral to endocrinology in epic.

## 2021-01-07 LAB — INFLIXIMAB+AB (SERIAL MONITOR)
Anti-Infliximab Antibody: <22 ng/mL
Infliximab Drug Level: 4.3 ug/mL

## 2021-01-13 ENCOUNTER — Telehealth: Payer: Self-pay

## 2021-01-13 NOTE — Telephone Encounter (Signed)
Antibody to infliximab was negative/undetected, however the infliximab trough level was lower than goal at 4.3 (was then infused on 01/06/2021).  For severe, fistulizing disease, goal is trough level >10.  Therefore, need to make a change in therapy with options including the following: -Increase Inflectra to 10 mg/kg interval at every 8 weeks -Infusion interval to every 6 weeks, but keep Inflectra 5 mg/kilogram   Either of these are a reasonable option for proactive drug monitoring, with plan to then repeat antibody and trough level after another 3-4 doses.  Amber Blanchard would like to discuss further in person, can set up appointment with me.

## 2021-01-13 NOTE — Telephone Encounter (Signed)
Spoke with patient in regards to lab results and recommendations. Pt would like to proceed with Inflectra 5 mg/kg every 6 weeks. Pt is aware that we will need to complete labs after 3-4 doses of the Inflectra at every 6 weeks. Pt is aware that we will fax new order to [facility] and they will work on authorization with her insurance. Advised that Lu Duffel will contact her with the insurance response and will help her get scheduled accordingly. Pt verbalized understanding of all information and had no concerns at the end of the call.   Records, demographic, insurance information and new POT faxed to [facility].

## 2021-02-15 ENCOUNTER — Telehealth: Payer: Self-pay | Admitting: Gastroenterology

## 2021-02-15 NOTE — Telephone Encounter (Signed)
Patient called wanting to know if her medication has been approved.  Please leave a detailed message at [phone].  Thank you.

## 2021-02-15 NOTE — Telephone Encounter (Signed)
Called and spoke with Charisse Klinefelter, case manager at [employer]. She reports that the Inflectra 5 mg/kg every 6 weeks did not require an authorization. Pt is currently scheduled for her next infusion on 03/04/21. They will contact patient to get her scheduled for a sooner appt so that she will be 6 weeks out from her last infusion. I left patient a detailed vm with all of this information. Advised pt to call us back if she had any questions or concerns.

## 2021-02-22 NOTE — Telephone Encounter (Signed)
Mallie Snooks returned my call. She reports that the patient's insurance will not approve Inflectra 5 mg/kg every 6 weeks, but they will approve Inflectra 10 mg/kg every 8 weeks. Mallie Snooks requested that I fax a new POT to their office. Advised that I will send new order today. I left a detailed vm for the patient with this information. Advised pt to call us back if she has any questions or concerns.

## 2021-02-22 NOTE — Telephone Encounter (Signed)
New POT (Inflectra 10 mg/kg) faxed to [facility] center. Two confirmation sheets received.

## 2021-02-22 NOTE — Telephone Encounter (Signed)
Hey [place], I had a Mallie Snooks (I believe that's how you spell her name) call from an infusion facility in regards to a patient named Amber Blanchard DOB 08-Jul-1990 I looked her up in the system and could not find her. Mallie Snooks said she had a missed call from the nurse and said she had more information to give about the patient. She said she was going to lunch at 11 and would be back at 12. I'm not sure if you have spoken to her. Call back number is [phone].

## 2021-02-22 NOTE — Telephone Encounter (Signed)
Lm on vm for T[place]ka to return call.

## 2021-03-04 ENCOUNTER — Telehealth: Payer: Self-pay

## 2021-03-04 NOTE — Telephone Encounter (Signed)
Received an infusion update from [facility]. Pt received Inflectra 950 mg on 03/04/21. Patient tolerated infusion without complications. Patient's next infusion is 04/29/21 at 8:30 am. There were no other changes/concerns since patient's last visit.

## 2021-03-15 ENCOUNTER — Telehealth: Payer: Self-pay | Admitting: Gastroenterology

## 2021-03-15 NOTE — Telephone Encounter (Signed)
Called and spoke with patient. She states that she is not feeling well and doesn't feel like herself. Pt states that before her fistulas had seemed to be healed but since her infusion she feels like she has been developing weeping abscess. Pt states that she has been applying heat to the abscess and they seem to clear up a little. Pt reports LLQ pain also, she states that it is not her cycle because she just finished that. She also reports a persistent cough. Pt reports that all of her symptoms started after her latest infusion. Pt wanted to schedule an appt with Dr. Salena Saner. Pt is aware that his next available is not until 03/23/21 at 10 am at the [facility] office. Pt was offered a sooner appt with an APP but she declined, she prefers to see Dr. Barron Alvine. Pt states that she has not talked with the surgeon  about the possible abscess because they tend to go away. Pt verbalized understanding and had no concerns at the end of the call.

## 2021-03-15 NOTE — Telephone Encounter (Signed)
Patient requests a call from the nurse to talk to her about her symptoms after her last infusion.  Please call.  Thank you.

## 2021-03-23 ENCOUNTER — Ambulatory Visit: Payer: Medicaid Other | Admitting: Gastroenterology

## 2021-03-28 ENCOUNTER — Telehealth: Payer: Medicaid Other | Admitting: Physician Assistant

## 2021-03-28 DIAGNOSIS — N760 Acute vaginitis: Secondary | ICD-10-CM

## 2021-03-28 DIAGNOSIS — B9689 Other specified bacterial agents as the cause of diseases classified elsewhere: Secondary | ICD-10-CM

## 2021-03-29 MED ORDER — METRONIDAZOLE 500 MG PO TABS
500.0000 mg | ORAL_TABLET | Freq: Two times a day (BID) | ORAL | 0 refills | Status: DC
Start: 2021-03-29 — End: 2021-04-29

## 2021-03-29 NOTE — Progress Notes (Signed)
I have spent 5 minutes in review of e-visit questionnaire, review and updating patient chart, medical decision making and response to patient.   Tamela Elsayed Cody Kiyona Mcnall, PA-C    

## 2021-03-29 NOTE — Progress Notes (Signed)

## 2021-04-02 ENCOUNTER — Telehealth: Payer: Medicaid Other | Admitting: Physician Assistant

## 2021-04-02 DIAGNOSIS — B379 Candidiasis, unspecified: Secondary | ICD-10-CM

## 2021-04-02 MED ORDER — FLUCONAZOLE 150 MG PO TABS
150.0000 mg | ORAL_TABLET | Freq: Once | ORAL | 0 refills | Status: AC
Start: 2021-04-02 — End: 2021-04-02

## 2021-04-02 NOTE — Progress Notes (Signed)

## 2021-04-29 ENCOUNTER — Other Ambulatory Visit: Payer: Self-pay

## 2021-04-29 ENCOUNTER — Ambulatory Visit (INDEPENDENT_AMBULATORY_CARE_PROVIDER_SITE_OTHER): Payer: Medicaid Other | Admitting: Gastroenterology

## 2021-04-29 ENCOUNTER — Encounter: Payer: Self-pay | Admitting: Gastroenterology

## 2021-04-29 VITALS — BP 148/90 | HR 90 | Ht 63.0 in | Wt 213.1 lb

## 2021-04-29 DIAGNOSIS — K56699 Other intestinal obstruction unspecified as to partial versus complete obstruction: Secondary | ICD-10-CM

## 2021-04-29 DIAGNOSIS — Z23 Encounter for immunization: Secondary | ICD-10-CM | POA: Diagnosis not present

## 2021-04-29 DIAGNOSIS — E559 Vitamin D deficiency, unspecified: Secondary | ICD-10-CM

## 2021-04-29 DIAGNOSIS — K50019 Crohn's disease of small intestine with unspecified complications: Secondary | ICD-10-CM | POA: Diagnosis not present

## 2021-04-29 DIAGNOSIS — D509 Iron deficiency anemia, unspecified: Secondary | ICD-10-CM | POA: Diagnosis not present

## 2021-04-29 DIAGNOSIS — K603 Anal fistula: Secondary | ICD-10-CM

## 2021-04-29 DIAGNOSIS — E538 Deficiency of other specified B group vitamins: Secondary | ICD-10-CM | POA: Diagnosis not present

## 2021-04-29 DIAGNOSIS — K315 Obstruction of duodenum: Secondary | ICD-10-CM

## 2021-04-29 NOTE — Patient Instructions (Addendum)
If you are age 32 or older, your body mass index should be between 23-30. Your Body mass index is 37.75 kg/m. If this is out of the aforementioned range listed, please consider follow up with your Primary Care Provider.  If you are age 91 or younger, your body mass index should be between 19-25. Your Body mass index is 37.75 kg/m. If this is out of the aformentioned range listed, please consider follow up with your Primary Care Provider.   __________________________________________________________  The [facility] providers would like to encourage you to use [facility] to communicate with providers for non-urgent requests or questions.  Due to long hold times on the telephone, sending your provider a message by [facility] may be a faster and more efficient way to get a response.  Please allow 48 business hours for a response.  Please remember that this is for non-urgent requests.   We will need to do an EGD/Colon in 4/23 please contact the office to schedule this procedure  Please go to the [place] office for your [facility] tomorrow. We will also need you to have your Infliximab level drawn several days prior to your next infusion.  You will need to have a DEXA scan done and you will be called for an appointment.   Thank you for choosing me and [facility].  Vito Cirigliano, D.O.

## 2021-04-29 NOTE — Progress Notes (Signed)
Chief Complaint:    Crohn's Disease  GI History: 31 year old female with aggressive phenotypic stricturing and penetrating type Crohn's Disease with upper tract and perianal modifiers, diagnosed 04/2020 (severe perianal disease with multiple perianal fistulous tracts, nontraversable stricture in transverse colon, duodenal strictures).   Evaluation to date: - TPMT: Normal - TB testing: Negative QuantiFERON gold 04/2020 - HBV status: Negative.  Completing vaccine series today - Pertinent Imaging: CTE  - Last colonoscopy: 04/2020 - Small bowel imaging: CTE 08/2020: Small rim-enhancing right paramidline anal rectal fistula measuring 5 x 1 cm.  Bilateral inguinal lymphadenopathy, likely reactive.  Changes of chronic Crohn's disease with postinflammatory mesenteric scarring and mild lymphadenopathy.  No current bowel wall thickening or obstruction.  Mild eccentric wall thickening in the lower rectum and a few mildly enlarged mesorectal lymph nodes. - History of EIMs: None   Medications to date: Prednisone, Inflectra Current medications: Inflectra   Health Maintenance: - DEXA: Ordered today - Vaccinations:      - Annual Flu Vaccine -UTD per patient      - Pneumococcal Vaccine -PCV13 given 8//2022. Second dose to be given by [facility] (not stocked here)      - Zoster vaccine if over age 26: Date N/A - Micronutrient eval:      - Annual Vit D, B6, iron panel: UTD      - Duodenal disease: calcium, folate: UTD      - Annual Pap (if immunosuppressed): Obtained through [facility] - Surveillance colonoscopy: Not due - Surveillance labs for immunomodulators: Lab check schedule as below - Annual depression screening: None - Annual Dermatology/Skin exam: Referral previously placed     Endoscopic History: - EGD (04/2020): Normal esophagus and stomach.  2 stenoses in duodenum (path: Active/chronic inflammation) - Colonoscopy (04/2020): Grade 3 hemorrhoids, perianal fistulae, scars from prior fistulous tracts, severe,  nontraversable stenosis in transverse colon (path: Severe active/chronic inflammatory change), mild sigmoid colitis, moderate colitis of distal rectum (path benign)  HPI:     Patient is a 31 y.o. female presenting to the Gastroenterology Clinic for follow-up.  Last seen by me on 10/29/2020.  Was feeling well at that time, tolerating Inflectra infusions without issue.  Was given pneumococcal vaccine.  Was seen by Dr. Morton Stall at [facility] in September.  Plan was for medical management with follow-up in 6 months.  Labs from 12/2020 reviewed and notable for: - Iron deficiency anemia.  Referred to Hematology for IV iron - Vitamin D deficiency.  Refer to Endocrinology for persistent vitamin D deficiency despite ergocalciferol - Normal B12 - Infliximab antibody negative/undetectable.  Infliximab trough 4.3.  Increased Inflectra to 10 mg/kilogram every 8 weeks  Since that appointment no new imaging for review today.  Today, she states she started having a flare of index symptoms 5 days ago, described as "tightness" in upper abdomen. No change in stools. Has had slight leakage from fistulas just prior to infusions, but not with this last one. Just had Inflectra infusion today. Has had 10 mg/kg dose x2 now.   Review of systems:     No chest pain, no SOB, no fevers, no urinary sx   Past Medical History:  Diagnosis Date   Anal stricture    Anemia    Cholelithiasis    External hemorrhoids    Genital condyloma, female    HPV in female    Hypertension    Phreesia 03/11/2020    Patient's surgical history, family medical history, social history, medications and allergies were all reviewed  in Epic    Current Outpatient Medications  Medication Sig Dispense Refill   Cholecalciferol (VITAMIN D) 50 MCG (2000 UT) CAPS Take 1 capsule (2,000 Units total) by mouth daily. Start AFTER finished with Ergocalciferol 50,000 units 30 capsule 3   cyanocobalamin (,VITAMIN B-12,) 1000 MCG/ML injection  Inject 1 mL (1,000 mcg total) into the muscle every 14 (fourteen) days. 6 mL 0   inFLIXimab-dyyb (INFLECTRA IV) Inject 5 mg into the vein. Taken at [facility] infusion     metroNIDAZOLE (FLAGYL) 500 MG tablet Take 1 tablet (500 mg total) by mouth 2 (two) times daily. 14 tablet 0   Vitamin D, Ergocalciferol, (DRISDOL) 1.25 MG (50000 UNIT) CAPS capsule Take 1 capsule (50,000 Units total) by mouth every 7 (seven) days. Take for 8 weeks. Then start Vit D-2000 units daily 8 capsule 0   No current facility-administered medications for this visit.    Physical Exam:     There were no vitals taken for this visit.  GENERAL:  Pleasant female in NAD PSYCH: : Cooperative, normal affect NEURO: Alert and oriented x 3, no focal neurologic deficits   IMPRESSION and PLAN:    1) Crohn's Disease 2) Transverse colon stricture 3) Duodenal strictures 4) Perianal fistulous 31 year old female with aggressive phenotypic stricturing and penetrating type Crohn's Disease with upper tract and perianal modifiers and multiple vitamin deficiencies.  - Check ESR, CRP now.  Historically ESR elevated and CRP normal even during flare.  No watery stools, diarrhea - Check infliximab trough and antibody 1 week prior to next dose for continued proactive monitoring and consideration of shortened interval, particularly if symptoms in the last week or so prior to infusion - Continue Inflectra 10 mg/kilogram every 8 weeks as scheduled - Complete hepatitis B vaccine series as scheduled today - Obtain PPSV23 through [facility] office (PPSV23 not stocked here) - To follow-up with Dr. Morton Stall at [facility] as planned on 05/26/2021 - DEXA scan - Colonoscopy in 06/2021 to evaluate for deep remission at new Inflectra dose - Repeat EGD in 06/2021 as well to assess for duodenal stricture improvement/resolution - Not taking 6-MP   5) Vitamin D deficiency - Encouraged her to make appoint with Endocrinology for persistent vitamin D  deficiency despite ergocalciferol  6) B12 deficiency - Continue B12 injections - Repeat B12 check at follow-up  7) Iron deficiency - Referral previously placed for IV iron with Hematology.  Encouraged her to make appointment again for IV iron  RTC in 6 months or sooner as needed  I spent 30 minutes of time, including in depth chart review, independent review of results as outlined above, communicating results with the patient directly, face-to-face time with the patient, coordinating care, and ordering studies and medications as appropriate, and documentation.    Lavena Bullion ,DO, FACG 04/29/2021, 3:21 PM

## 2021-06-08 ENCOUNTER — Telehealth: Payer: Medicaid Other | Admitting: Nurse Practitioner

## 2021-06-08 DIAGNOSIS — N760 Acute vaginitis: Secondary | ICD-10-CM

## 2021-06-08 DIAGNOSIS — B9689 Other specified bacterial agents as the cause of diseases classified elsewhere: Secondary | ICD-10-CM | POA: Diagnosis not present

## 2021-06-09 MED ORDER — METRONIDAZOLE 500 MG PO TABS: 500.0000 mg | ORAL_TABLET | Freq: Two times a day (BID) | ORAL | 0 refills | Status: DC

## 2021-06-09 MED ORDER — FLUCONAZOLE 150 MG PO TABS: 150.0000 mg | ORAL_TABLET | Freq: Once | ORAL | 0 refills | Status: AC

## 2021-06-09 NOTE — Progress Notes (Signed)
E-Visit for Vaginal Symptoms ? ?We are sorry that you are not feeling well. Here is how we plan to help! ?Based on what you shared with me it looks like you: May have a vaginosis due to bacteria ? ?Vaginosis is an inflammation of the vagina that can result in discharge, itching and pain. The cause is usually a change in the normal balance of vaginal bacteria or an infection. Vaginosis can also result from reduced estrogen levels after menopause. ? ?The most common causes of vaginosis are: ? ? Bacterial vaginosis which results from an overgrowth of one on several organisms that are normally present in your vagina. ? ? Yeast infections which are caused by a naturally occurring fungus called candida. ? ? Vaginal atrophy (atrophic vaginosis) which results from the thinning of the vagina from reduced estrogen levels after menopause. ? ? Trichomoniasis which is caused by a parasite and is commonly transmitted by sexual intercourse. ? ?Factors that increase your risk of developing vaginosis include: ?Medications, such as antibiotics and steroids ?Uncontrolled diabetes ?Use of hygiene products such as bubble bath, vaginal spray or vaginal deodorant ?Douching ?Wearing damp or tight-fitting clothing ?Using an intrauterine device (IUD) for birth control ?Hormonal changes, such as those associated with pregnancy, birth control pills or menopause ?Sexual activity ?Having a sexually transmitted infection ? ?Your treatment plan is Metronidazole or Flagyl 51m twice a day for 7 days.  I have electronically sent this prescription into the pharmacy that you have chosen. I have also called In a diflucan for yu as requested. ? ?Be sure to take all of the medication as directed. Stop taking any medication if you develop a rash, tongue swelling or shortness of breath. Mothers who are breast feeding should consider pumping and discarding their breast milk while on these antibiotics. However, there is no consensus that infant exposure at  these doses would be harmful.  ?Remember that medication creams can weaken latex condoms. ?. ? ? ?HOME CARE: ? ?Good hygiene may prevent some types of vaginosis from recurring and may relieve some symptoms: ? ?Avoid baths, hot tubs and whirlpool spas. Rinse soap from your outer genital area after a shower, and dry the area well to prevent irritation. Don't use scented or harsh soaps, such as those with deodorant or antibacterial action. ?Avoid irritants. These include scented tampons and pads. ?Wipe from front to back after using the toilet. Doing so avoids spreading fecal bacteria to your vagina. ? ?Other things that may help prevent vaginosis include: ? ?Don't douche. Your vagina doesn't require cleansing other than normal bathing. Repetitive douching disrupts the normal organisms that reside in the vagina and can actually increase your risk of vaginal infection. Douching won't clear up a vaginal infection. ?Use a latex condom. Both female and female latex condoms may help you avoid infections spread by sexual contact. ?Wear cotton underwear. Also wear pantyhose with a cotton crotch. If you feel comfortable without it, skip wearing underwear to bed. Yeast thrives in moist environments ?Your symptoms should improve in the next day or two. ? ?GET HELP RIGHT AWAY IF: ? ?You have pain in your lower abdomen ( pelvic area or over your ovaries) ?You develop nausea or vomiting ?You develop a fever ?Your discharge changes or worsens ?You have persistent pain with intercourse ?You develop shortness of breath, a rapid pulse, or you faint. ? ?These symptoms could be signs of problems or infections that need to be evaluated by a medical provider now. ? ?MAKE SURE YOU  ? ?  Understand these instructions. ?Will watch your condition. ?Will get help right away if you are not doing well or get worse. ? ?Thank you for choosing an e-visit. ? ?Your e-visit answers were reviewed by a board certified advanced clinical practitioner to  complete your personal care plan. Depending upon the condition, your plan could have included both over the counter or prescription medications. ? ?Please review your pharmacy choice. Make sure the pharmacy is open so you can pick up prescription now. If there is a problem, you may contact your provider through [employer] and have the prescription routed to another pharmacy.  Your safety is important to [place]. If you have drug allergies check your prescription carefully.  ? ?For the next 24 hours you can use MyChart to ask questions about today's visit, request a non-urgent call back, or ask for a work or school excuse. ?You will get an email in the next two days asking about your experience. I hope that your e-visit has been valuable and will speed your recovery. ? ?5-10 minutes spent reviewing and documenting in chart. ? ?

## 2021-06-15 ENCOUNTER — Encounter: Payer: Self-pay | Admitting: Gastroenterology

## 2021-06-17 ENCOUNTER — Other Ambulatory Visit (INDEPENDENT_AMBULATORY_CARE_PROVIDER_SITE_OTHER): Payer: Medicaid Other

## 2021-06-17 ENCOUNTER — Other Ambulatory Visit: Payer: Self-pay

## 2021-06-17 ENCOUNTER — Telehealth: Payer: Self-pay

## 2021-06-17 DIAGNOSIS — K50019 Crohn's disease of small intestine with unspecified complications: Secondary | ICD-10-CM

## 2021-06-17 LAB — SEDIMENTATION RATE: Sed Rate: 86 mm/hr — ABNORMAL HIGH (ref 0–20)

## 2021-06-17 LAB — C-REACTIVE PROTEIN: CRP: <1 mg/dL (ref 0.5–20.0)

## 2021-06-17 NOTE — Telephone Encounter (Signed)
-----   Message from Marice Potter, RN sent at 05/13/2021 10:03 AM EST ----- ?Inflectra trough and antibody levels due on 3/23 prior to inflectra infusion on 3/30 ? ? ?

## 2021-06-17 NOTE — Telephone Encounter (Signed)
Left voicemail for pt to call back. Lab order in epic. ?

## 2021-06-17 NOTE — Telephone Encounter (Signed)
Spoke with pt. Pt states she will stop by lab in [place] today.  ?

## 2021-06-21 LAB — TIQ-MISC

## 2021-06-22 ENCOUNTER — Telehealth: Payer: Self-pay

## 2021-06-22 ENCOUNTER — Other Ambulatory Visit: Payer: Self-pay

## 2021-06-22 NOTE — Telephone Encounter (Signed)
The test ordered for the patient's infliximab level and anti-drug antibody is incorrect and no longer offered.  ?Returned the call to the lab. Confirmed and corrected the test to be ordered. ?

## 2021-06-23 ENCOUNTER — Telehealth: Payer: Self-pay | Admitting: Gastroenterology

## 2021-06-23 NOTE — Telephone Encounter (Signed)
Inbound call from patient with questions about labs and infusion she have scheduled for tomorrow 3/30 ?

## 2021-06-23 NOTE — Telephone Encounter (Signed)
Spoke with pt. Pt wanted to know if she should have infusion tomorrow since inflectra labs have not resulted. Let pt know that she should keep appt and we will update her on Dr. Vivia Ewing recommendations once those levels come back. Pt verbalized understanding and had no other concerns at end of call.  ?

## 2021-06-26 LAB — SERIAL MONITORING

## 2021-06-27 LAB — INFLIXIMAB+AB (SERIAL MONITOR)
Anti-Infliximab Antibody: <22 ng/mL
Infliximab Drug Level: 4.9 ug/mL

## 2021-06-29 ENCOUNTER — Telehealth: Payer: Self-pay

## 2021-06-29 NOTE — Telephone Encounter (Signed)
-----   Message from Lavena Bullion, DO sent at 06/28/2021  1:08 PM EDT ----- ?No antibody to infliximab.  Infliximab trough level is 4.9.  While this is technically within therapeutic range, this is sub-therapeutic given her severe, fistulizing Crohn's Disease, and ideally should be targeting a trough level of 10.  Plan to decrease dosing interval to every 6 weeks.  Plan for the following: ? ?- Continue Inflectra 10 mg/kilogram every 6 weeks ?- Follow-up with me in the GI clinic later this month as scheduled ? ?----- Message ----- ?From: Marice Potter, RN ?Sent: 06/28/2021  10:00 AM EDT ?To: Lavena Bullion, DO ? ?Looks like Infliximab + ab lab has resulted  ? ?

## 2021-06-29 NOTE — Telephone Encounter (Signed)
Pt records and inflectra frequency change faxed to [facility] infusion center. Gave pt results and recommendations. Pt verbalized understanding and had no other concerns at end of call.  ?

## 2021-06-30 ENCOUNTER — Telehealth: Payer: Self-pay

## 2021-06-30 ENCOUNTER — Ambulatory Visit: Payer: Medicaid Other

## 2021-06-30 NOTE — Telephone Encounter (Signed)
Multiple attempts made to reach patient- PV RN unable to reach patient; RN left message for patient to call back to office to reschedule PV appt;  ?If patient fails to call back to the office prior to 5 pm- PV and procedure appts will be cancelled and a no show letter will be sent to the patient via MyChart; ?

## 2021-07-01 ENCOUNTER — Ambulatory Visit (AMBULATORY_SURGERY_CENTER): Payer: Medicaid Other | Admitting: *Deleted

## 2021-07-01 VITALS — Ht 63.0 in | Wt 210.0 lb

## 2021-07-01 DIAGNOSIS — K50019 Crohn's disease of small intestine with unspecified complications: Secondary | ICD-10-CM

## 2021-07-01 DIAGNOSIS — D509 Iron deficiency anemia, unspecified: Secondary | ICD-10-CM

## 2021-07-01 MED ORDER — PLENVU 140 G PO SOLR: 1.0000 | ORAL | 0 refills | Status: DC

## 2021-07-01 NOTE — Progress Notes (Signed)
No egg or soy allergy known to patient  ?No issues known to pt with past sedation with any surgeries or procedures ?Patient denies ever being told they had issues or difficulty with intubation  ?No FH of Malignant Hyperthermia ?Pt is not on diet pills ?Pt is not on  home 02  ?Pt is not on blood thinners  ?Pt denies issues with constipation  ?No A fib or A flutter ? ? ?Due to the COVID-19 pandemic we are asking patients to follow certain guidelines in PV and the [facility]   ?Pt aware of COVID protocols and [facility] guidelines  ? ?PV completed over the phone. Pt verified name, DOB, address and insurance during PV today.  ? ?Pt encouraged to call with questions or issues.  ?If pt has My chart, procedure instructions sent via My Chart   ?

## 2021-07-03 LAB — INFLIXIMAB LEVEL AND ADA FOR IBD
Infliximab ADA, IBD: 11 AU — ABNORMAL HIGH (ref <10)
Infliximab Level, IBD: 6.7 ug/mL

## 2021-07-03 LAB — INFLIXIMAB LEVEL,ADA FOR RHEUMATIC DZ

## 2021-07-11 ENCOUNTER — Encounter: Payer: Self-pay | Admitting: Certified Registered Nurse Anesthetist

## 2021-07-21 ENCOUNTER — Telehealth: Payer: Self-pay

## 2021-07-21 ENCOUNTER — Telehealth: Payer: Self-pay | Admitting: Gastroenterology

## 2021-07-21 ENCOUNTER — Encounter: Payer: Medicaid Other | Admitting: Gastroenterology

## 2021-07-21 NOTE — Telephone Encounter (Signed)
Good Morning Dr. Bryan Lemma, ? ?I called this patient at 7:45am today to see if she would be coming for her procedures.  I did not get a hold of anyone so I left a message to call if she was on her way or if she needed to reschedule. ? ?I will NO SHOW her. ?

## 2021-07-21 NOTE — Telephone Encounter (Signed)
Left voicemail for pt to call back. Letter sent to [facility] and mailed.  ?

## 2021-07-21 NOTE — Telephone Encounter (Signed)
-----   Message from Lavena Bullion, DO sent at 07/21/2021  9:45 AM EDT ----- ?Ok thanks. I think we should place a call and certified letter issuing a notice that out of respect for our other patients and the many patients waiting to be seen, any further no shows will result in dismissal from the practice. Thanks.  ? ?----- Message ----- ?From: Marice Potter, RN ?Sent: 07/21/2021   9:25 AM EDT ?To: Lavena Bullion, DO ? ?It looks like 7 no shows for this office since 2015 but one was with Nevin Bloodgood and one was with Dr. Fuller Plan. I don't see a warning.  ?----- Message ----- ?From: Lavena Bullion, DO ?Sent: 07/21/2021   8:35 AM EDT ?To: Marice Potter, RN ? ?This patient was a no-show for EGD/Colonoscopy this morning. No calls or anything in the system. She has a hx of no-show and last minute cancellation. Can you please look into her chart to see how many of those and if she has been issued a warning already?  ? ?Thanks. ? ? ? ? ?

## 2021-08-02 ENCOUNTER — Ambulatory Visit: Payer: Medicaid Other | Admitting: Podiatry

## 2021-08-20 ENCOUNTER — Telehealth: Payer: Self-pay | Admitting: Gastroenterology

## 2021-08-20 NOTE — Telephone Encounter (Signed)
Patient receives infusions at [facility] every 6 weeks and her next one is scheduled for June 23.  However, she is going to be in [place], [place], from June 9 - August 15.  They do not have a [facility] there to do her infusions.  Do you know where else she may be able to have them done there?  Please call patient and advise.  Thank you.

## 2021-08-24 NOTE — Telephone Encounter (Signed)
Please assist patient in trying to locate infusion centers in [place] so that she can continue receiving infusions while away.  May also be able to coordinate through her insurance ([employer] [place]).

## 2021-08-24 NOTE — Telephone Encounter (Signed)
Called pt's insurance and they said she could use [facility] in [place] but it is out of network since it's in [place] and she would need Prior Authorization. Called [facility] and they said that they would complete the prior auth they just need the order, pt information including TB and hep b screening results and most recent CMP and CBC with diff preferred within the last 90 days. Her last TB test that I can find was 05/15/20. Does she need to come in for a repeat TB test and CMP and CBC before referral is faxed?

## 2021-08-24 NOTE — Telephone Encounter (Signed)
Inbound call from patient stating she was just following up on the call from 5/26. Please advise.

## 2021-08-25 ENCOUNTER — Other Ambulatory Visit: Payer: Self-pay

## 2021-08-25 ENCOUNTER — Other Ambulatory Visit: Payer: Self-pay | Admitting: Gastroenterology

## 2021-08-25 DIAGNOSIS — K50019 Crohn's disease of small intestine with unspecified complications: Secondary | ICD-10-CM

## 2021-08-25 NOTE — Telephone Encounter (Signed)
Spoke with pt and let her know about labs that are due and infusion center in [place]. Pt stated that she is currently in [place] now and would need to get lab work done there. [facility] [place] [address]. Fax: [phone]. Pt verbalized understanding and had no other concerns at end of call.

## 2021-08-25 NOTE — Addendum Note (Signed)
Addended by: Berniece Salines A on: 08/25/2021 09:54 AM   Modules accepted: Orders

## 2021-09-01 LAB — CBC WITH DIFFERENTIAL/PLATELET
Basophils Absolute: 0 10*3/uL (ref 0.0–0.2)
Basos: 1 %
EOS (ABSOLUTE): 0.1 10*3/uL (ref 0.0–0.4)
Eos: 2 %
Hematocrit: 31.3 % — ABNORMAL LOW (ref 34.0–46.6)
Hemoglobin: 9.6 g/dL — ABNORMAL LOW (ref 11.1–15.9)
Immature Grans (Abs): 0 10*3/uL (ref 0.0–0.1)
Immature Granulocytes: 0 %
Lymphocytes Absolute: 1.9 10*3/uL (ref 0.7–3.1)
Lymphs: 31 %
MCH: 23.6 pg — ABNORMAL LOW (ref 26.6–33.0)
MCHC: 30.7 g/dL — ABNORMAL LOW (ref 31.5–35.7)
MCV: 77 fL — ABNORMAL LOW (ref 79–97)
Monocytes Absolute: 0.6 10*3/uL (ref 0.1–0.9)
Monocytes: 9 %
Neutrophils Absolute: 3.4 10*3/uL (ref 1.4–7.0)
Neutrophils: 57 %
Platelets: 375 10*3/uL (ref 150–450)
RBC: 4.07 x10E6/uL (ref 3.77–5.28)
RDW: 15.9 % — ABNORMAL HIGH (ref 11.7–15.4)
WBC: 6 10*3/uL (ref 3.4–10.8)

## 2021-09-01 LAB — QUANTIFERON-TB GOLD PLUS
QuantiFERON Mitogen Value: >10 IU/mL
QuantiFERON Nil Value: 0.01 IU/mL
QuantiFERON TB1 Ag Value: 0.01 IU/mL
QuantiFERON TB2 Ag Value: 0 IU/mL
QuantiFERON-TB Gold Plus: NEGATIVE

## 2021-09-01 LAB — COMPREHENSIVE METABOLIC PANEL
ALT: 17 IU/L (ref 0–32)
AST: 22 IU/L (ref 0–40)
Albumin/Globulin Ratio: 1.1 — ABNORMAL LOW (ref 1.2–2.2)
Albumin: 3.8 g/dL (ref 3.8–4.8)
Alkaline Phosphatase: 70 IU/L (ref 44–121)
BUN/Creatinine Ratio: 19 (ref 9–23)
BUN: 14 mg/dL (ref 6–20)
Bilirubin Total: 0.3 mg/dL (ref 0.0–1.2)
CO2: 24 mmol/L (ref 20–29)
Calcium: 8.9 mg/dL (ref 8.7–10.2)
Chloride: 103 mmol/L (ref 96–106)
Creatinine, Ser: 0.75 mg/dL (ref 0.57–1.00)
Globulin, Total: 3.5 g/dL (ref 1.5–4.5)
Glucose: 89 mg/dL (ref 70–99)
Potassium: 4.2 mmol/L (ref 3.5–5.2)
Sodium: 139 mmol/L (ref 134–144)
Total Protein: 7.3 g/dL (ref 6.0–8.5)
eGFR: 108 mL/min/{1.73_m2} (ref >59)

## 2021-09-01 LAB — HEPATITIS B SURFACE ANTIBODY,QUALITATIVE: Hep B Surface Ab, Qual: REACTIVE

## 2021-09-01 LAB — HEPATITIS B SURFACE ANTIGEN: Hepatitis B Surface Ag: NEGATIVE

## 2021-09-01 NOTE — Telephone Encounter (Signed)
Inflectra prescription sheet and pt documents faxed to [facility] at [phone].

## 2021-09-08 ENCOUNTER — Telehealth: Payer: Medicaid Other | Admitting: Physician Assistant

## 2021-09-08 DIAGNOSIS — B9689 Other specified bacterial agents as the cause of diseases classified elsewhere: Secondary | ICD-10-CM

## 2021-09-08 DIAGNOSIS — B379 Candidiasis, unspecified: Secondary | ICD-10-CM

## 2021-09-08 NOTE — Progress Notes (Signed)
[place] out of [place]

## 2021-09-09 ENCOUNTER — Telehealth: Payer: Self-pay | Admitting: Gastroenterology

## 2021-09-09 NOTE — Telephone Encounter (Signed)
Patient called regarding her infusions she's  having while she is down in [place].  Due to her insurance, she has to have it at a hospital facility and will need orders sent there.  She found one, and this is the information:  [facility] [phone] Fax      [phone]

## 2021-09-13 NOTE — Telephone Encounter (Signed)
Attempted to call number provided but was unable to get through due to holiday. Order faxed to [phone].

## 2021-09-17 NOTE — Telephone Encounter (Addendum)
Called and spoke with [facility] clinic in [place]. They stated that they only accept patients from their providers. Called pt to let her know that she would not be able to get infusion at that facility. Pt verbalized understanding and stated she would call her insurance company to find another location and call us back on Monday. Pt will be in [place] until the beginning of September.

## 2021-09-20 ENCOUNTER — Encounter: Payer: Self-pay | Admitting: Gastroenterology

## 2021-10-13 ENCOUNTER — Telehealth: Payer: Self-pay | Admitting: Gastroenterology

## 2021-10-13 NOTE — Telephone Encounter (Signed)
Patient called to schedule an appointment with Dr. Bryan Lemma scheduled for 8/31. She said the inflectra was breaking her out in a rash.  Please advise if there is anything she can do between now and the appointment.  Thank you.

## 2021-10-13 NOTE — Telephone Encounter (Signed)
If rash persists, would recommend she sees primary care or dermatologist if possible she is traveling to get a better idea of what this rash is.  Would be hesitant about doing another infusion if she thinks this occurred with each of the last 2 infusions.  Also plan to check infliximab antibody and trough 1 week prior to the next scheduled infusion.

## 2021-10-13 NOTE — Telephone Encounter (Signed)
Pt states she first had a rash around the time of her infusion in May. Then she got a cortisone shot and it helped with itchiness and rash. This time rash started soon after infusion on July 14th and itchiness started while she was still receiving the infusion. Pt states rash looks like chickenpox but when she went to urgent care the doctor didn't think that was it. Pt has small red raised itchy bumps on stomach, back, and sides. Pt has tried taking benadryl for rash but benadryl hasn't helped. Pt hasn't used any new soaps or lotions and feels like rash is related to infusion. Pt's next infusion is scheduled for 11/20/21 and pt stated she thought she would receive infusion and see Dr. Bryan Lemma after she comes back to [facility] on August 29th.

## 2021-10-14 ENCOUNTER — Other Ambulatory Visit: Payer: Self-pay

## 2021-10-14 DIAGNOSIS — K50019 Crohn's disease of small intestine with unspecified complications: Secondary | ICD-10-CM

## 2021-10-14 NOTE — Telephone Encounter (Signed)
Spoke with pt and gave her recommendations. Lab order faxed to [facility] [place] [address] fax: [phone]. Pt verbalized understanding and knows to come to lab on 8/18 which is 1 week prior to next infusion on 8/25.

## 2021-10-22 ENCOUNTER — Telehealth: Payer: Self-pay | Admitting: Urgent Care

## 2021-10-22 DIAGNOSIS — T3695XA Adverse effect of unspecified systemic antibiotic, initial encounter: Secondary | ICD-10-CM

## 2021-10-22 DIAGNOSIS — N760 Acute vaginitis: Secondary | ICD-10-CM

## 2021-10-23 MED ORDER — METRONIDAZOLE 500 MG PO TABS: 500.0000 mg | ORAL_TABLET | Freq: Two times a day (BID) | ORAL | 0 refills | Status: AC

## 2021-10-23 MED ORDER — FLUCONAZOLE 150 MG PO TABS: 150.0000 mg | ORAL_TABLET | Freq: Once | ORAL | 0 refills | Status: AC

## 2021-10-23 NOTE — Progress Notes (Signed)
E-Visit for Vaginal Symptoms  We are sorry that you are not feeling well. Here is how we plan to help! Based on what you shared with me it looks like you: May have a vaginosis due to bacteria  Vaginosis is an inflammation of the vagina that can result in discharge, itching and pain. The cause is usually a change in the normal balance of vaginal bacteria or an infection. Vaginosis can also result from reduced estrogen levels after menopause.  The most common causes of vaginosis are:   Bacterial vaginosis which results from an overgrowth of one on several organisms that are normally present in your vagina.   Yeast infections which are caused by a naturally occurring fungus called candida.   Vaginal atrophy (atrophic vaginosis) which results from the thinning of the vagina from reduced estrogen levels after menopause.   Trichomoniasis which is caused by a parasite and is commonly transmitted by sexual intercourse.  Factors that increase your risk of developing vaginosis include: Medications, such as antibiotics and steroids Uncontrolled diabetes Use of hygiene products such as bubble bath, vaginal spray or vaginal deodorant Douching Wearing damp or tight-fitting clothing Using an intrauterine device (IUD) for birth control Hormonal changes, such as those associated with pregnancy, birth control pills or menopause Sexual activity Having a sexually transmitted infection  Your treatment plan is Metronidazole or Flagyl 546m twice a day for 7 days.  I have electronically sent this prescription into the pharmacy that you have chosen.  Be sure to take all of the medication as directed. Stop taking any medication if you develop a rash, tongue swelling or shortness of breath. Mothers who are breast feeding should consider pumping and discarding their breast milk while on these antibiotics. However, there is no consensus that infant exposure at these doses would be harmful.  Remember that  medication creams can weaken latex condoms. . Because this appears to be a recurrent issue, and the last vaginal swab confirmed by lab was in 2020, should your symptoms recur in the next 30-60 days, we would strongly suggest an in person evaluation to complete laboratory assessment.   HOME CARE:  Good hygiene may prevent some types of vaginosis from recurring and may relieve some symptoms:  Avoid baths, hot tubs and whirlpool spas. Rinse soap from your outer genital area after a shower, and dry the area well to prevent irritation. Don't use scented or harsh soaps, such as those with deodorant or antibacterial action. Avoid irritants. These include scented tampons and pads. Wipe from front to back after using the toilet. Doing so avoids spreading fecal bacteria to your vagina.  Other things that may help prevent vaginosis include:  Don't douche. Your vagina doesn't require cleansing other than normal bathing. Repetitive douching disrupts the normal organisms that reside in the vagina and can actually increase your risk of vaginal infection. Douching won't clear up a vaginal infection. Use a latex condom. Both female and female latex condoms may help you avoid infections spread by sexual contact. Wear cotton underwear. Also wear pantyhose with a cotton crotch. If you feel comfortable without it, skip wearing underwear to bed. Yeast thrives in mCampbell SoupYour symptoms should improve in the next day or two.  GET HELP RIGHT AWAY IF:  You have pain in your lower abdomen ( pelvic area or over your ovaries) You develop nausea or vomiting You develop a fever Your discharge changes or worsens You have persistent pain with intercourse You develop shortness of breath, a rapid pulse,  or you faint.  These symptoms could be signs of problems or infections that need to be evaluated by a medical provider now.  MAKE SURE YOU   Understand these instructions. Will watch your condition. Will get  help right away if you are not doing well or get worse.  Thank you for choosing an e-visit.  Your e-visit answers were reviewed by a board certified advanced clinical practitioner to complete your personal care plan. Depending upon the condition, your plan could have included both over the counter or prescription medications.  Please review your pharmacy choice. Make sure the pharmacy is open so you can pick up prescription now. If there is a problem, you may contact your provider through [employer] and have the prescription routed to another pharmacy.  Your safety is important to [place]. If you have drug allergies check your prescription carefully.   For the next 24 hours you can use MyChart to ask questions about today's visit, request a non-urgent call back, or ask for a work or school excuse. You will get an email in the next two days asking about your experience. I hope that your e-visit has been valuable and will speed your recovery.   I have spent 5 minutes in review of e-visit questionnaire, review and updating patient chart, medical decision making and response to patient.   [place], PA

## 2021-11-15 ENCOUNTER — Telehealth: Payer: Self-pay

## 2021-11-15 NOTE — Telephone Encounter (Signed)
Patient returned your phone call. She said if you were calling her to remind her about her labs that she was going to get them done today.

## 2021-11-15 NOTE — Telephone Encounter (Signed)
-----   Message from Marice Potter, RN sent at 10/14/2021 10:22 AM EDT ----- Regarding: labs Check if pt had labs done on 8/18. Pt getting infliximab trough and antibody at [facility] in [place].

## 2021-11-15 NOTE — Telephone Encounter (Signed)
Noted  

## 2021-11-15 NOTE — Telephone Encounter (Signed)
Left message for pt to call back  °

## 2021-11-25 ENCOUNTER — Ambulatory Visit: Payer: Medicaid Other | Admitting: Gastroenterology

## 2021-12-02 ENCOUNTER — Encounter: Payer: Self-pay | Admitting: Gastroenterology

## 2021-12-17 ENCOUNTER — Telehealth: Payer: Self-pay

## 2021-12-17 NOTE — Telephone Encounter (Signed)
Left message for pt to call back  °

## 2021-12-20 NOTE — Telephone Encounter (Signed)
Left message for pt to call back  °

## 2021-12-20 NOTE — Telephone Encounter (Addendum)
Pt stated she canceled last appointment because rash resolved. Pt also mentioned she received information from insurance that they denied Inflectra due to not having tried Humira. Pt stated she would be staying in [place] now instead of [place]. Called [facility] to see I needed to fax over a new referral or if  she could just return to [place] since she was receiving Inflectra infusions there before she went to [place]. [facility] stated a new referral would need to be sent. Attempted to call pt back and left voicemail. Also MyChart message sent to pt. Pt needs office follow up visit and I need to find out when patient's next infusion is due.

## 2021-12-20 NOTE — Telephone Encounter (Signed)
Patient returned your call, requesting a call back as soon as possible. Thankx

## 2021-12-21 NOTE — Telephone Encounter (Signed)
Called pt to schedule appt. Pt is scheduled for follow up on 11/2 at 11:20 am. New referral faxed to [facility].

## 2021-12-29 NOTE — Telephone Encounter (Signed)
Patient called requesting to speak with you regarding her Inflectra medication.

## 2021-12-29 NOTE — Telephone Encounter (Signed)
Pt called and was concerned about receiving a second letter in the mail from her insurance stating that she needs to try humira before using inflectra. Let pt know I would contact [facility] because I hadn't received anything yet from [facility] and they said that everything has been sent to the patient's insurance and they are just waiting for insurance verification. [facility] said once they receive her insurance company's decision they would contact us. Pt concerned because next inflectra dose is due this Friday 12/31/21. Pt worried that fistula would open again and wanted to know if there is anything she can do to prevent this.

## 2021-12-30 NOTE — Telephone Encounter (Signed)
Yes, continue to move forward with trying to get Inflectra set up with [facility].  It would be incredibly misguided by her insurance to change her from [place] to Humira yearly for insurance purposes.  We do not change medications in Crohn's patients who are in clinical remission.  It is very clearly stated in the guidelines.  If needed, I can discuss as a peer to peer, but would hope they will do the right thing and keep her on her current medication as she has complex Crohn's disease and any change in therapy could result in losing remission.  Agree, she needs to schedule OV for continued follow-up.  She does have several missed appointments/no-shows.

## 2021-12-30 NOTE — Telephone Encounter (Signed)
Spoke with pt and gave pt Dr. Vivia Ewing message. Pt verbalized understanding. Pt is scheduled for f/u appointment on 01/27/22 with Dr. Bryan Lemma.

## 2022-01-11 NOTE — Telephone Encounter (Signed)
Called [facility] who stated that pt was scheduled to receive inflectra infusion on 01/15/22.

## 2022-01-21 ENCOUNTER — Ambulatory Visit: Payer: Medicaid Other | Admitting: Gastroenterology

## 2022-01-27 ENCOUNTER — Ambulatory Visit: Payer: Medicaid Other | Admitting: Gastroenterology

## 2022-03-02 ENCOUNTER — Encounter: Payer: Self-pay | Admitting: Gastroenterology

## 2022-03-02 ENCOUNTER — Ambulatory Visit: Payer: Medicaid Other | Admitting: Gastroenterology

## 2022-03-02 ENCOUNTER — Other Ambulatory Visit (INDEPENDENT_AMBULATORY_CARE_PROVIDER_SITE_OTHER): Payer: Medicaid Other

## 2022-03-02 VITALS — BP 140/102 | HR 89 | Ht 63.0 in | Wt 243.0 lb

## 2022-03-02 DIAGNOSIS — K50019 Crohn's disease of small intestine with unspecified complications: Secondary | ICD-10-CM

## 2022-03-02 DIAGNOSIS — D509 Iron deficiency anemia, unspecified: Secondary | ICD-10-CM

## 2022-03-02 DIAGNOSIS — E559 Vitamin D deficiency, unspecified: Secondary | ICD-10-CM

## 2022-03-02 DIAGNOSIS — K603 Anal fistula, unspecified: Secondary | ICD-10-CM

## 2022-03-02 DIAGNOSIS — E538 Deficiency of other specified B group vitamins: Secondary | ICD-10-CM

## 2022-03-02 DIAGNOSIS — K56699 Other intestinal obstruction unspecified as to partial versus complete obstruction: Secondary | ICD-10-CM

## 2022-03-02 DIAGNOSIS — K315 Obstruction of duodenum: Secondary | ICD-10-CM

## 2022-03-02 LAB — FOLATE: Folate: 10.4 ng/mL (ref >5.9)

## 2022-03-02 LAB — VITAMIN B12: Vitamin B-12: 254 pg/mL (ref 211–911)

## 2022-03-02 LAB — CBC
HCT: 31.3 % — ABNORMAL LOW (ref 36.0–46.0)
Hemoglobin: 10.2 g/dL — ABNORMAL LOW (ref 12.0–15.0)
MCHC: 32.4 g/dL (ref 30.0–36.0)
MCV: 78.5 fl (ref 78.0–100.0)
Platelets: 288 10*3/uL (ref 150.0–400.0)
RBC: 3.99 Mil/uL (ref 3.87–5.11)
RDW: 17.6 % — ABNORMAL HIGH (ref 11.5–15.5)
WBC: 7.8 10*3/uL (ref 4.0–10.5)

## 2022-03-02 LAB — IBC + FERRITIN
Ferritin: 4 ng/mL — ABNORMAL LOW (ref 10.0–291.0)
Iron: 24 ug/dL — ABNORMAL LOW (ref 42–145)
Saturation Ratios: 5.9 % — ABNORMAL LOW (ref 20.0–50.0)
TIBC: 408.8 ug/dL (ref 250.0–450.0)
Transferrin: 292 mg/dL (ref 212.0–360.0)

## 2022-03-02 MED ORDER — CLENPIQ 10-3.5-12 MG-GM -GM/175ML PO SOLN: 1.0000 | Freq: Once | ORAL | 0 refills | Status: AC

## 2022-03-02 NOTE — Patient Instructions (Addendum)
Your provider has requested that you go to the basement level for lab work before leaving today. Press "B" on the elevator. The lab is located at the first door on the left as you exit the elevator.   We have sent the following medications to your pharmacy for you to pick up at your convenience: Clenpiq  You have been scheduled for a bone density test on -------------- at ------------------. Please arrive 15 minutes prior to your scheduled appointment to radiology on the basement floor of [place] location for this test. If you need to cancel or reschedule for any reason, please contact radiology at [phone].  Preparation for test is as follows:  If you are taking calcium, discontinue this 24-48 hours prior to your appointment.  Wear pants with an elastic waistband (or without any metal such as a zipper).  Do not wear an underwire bra.  We do have gowns if you are unable to find appropriate clothing without metal.  Please bring a list of all current medications.    You have been scheduled for an endoscopy and colonoscopy. Please follow the written instructions given to you at your visit today. Please pick up your prep supplies at the pharmacy within the next 1-3 days. If you use inhalers (even only as needed), please bring them with you on the day of your procedure.   If you are age 65 or younger, your body mass index should be between 19-25. Your Body mass index is 43.05 kg/m. If this is out of the aformentioned range listed, please consider follow up with your Primary Care Provider.   __________________________________________________________  The [facility] providers would like to encourage you to use [facility] to communicate with providers for non-urgent requests or questions.  Due to long hold times on the telephone, sending your provider a message by [facility] may be a faster and more efficient way to get a response.  Please allow 48 business hours for a response.   Please remember that this is for non-urgent requests.   Due to recent changes in healthcare laws, you may see the results of your imaging and laboratory studies on MyChart before your provider has had a chance to review them.  We understand that in some cases there may be results that are confusing or concerning to you. Not all laboratory results come back in the same time frame and the provider may be waiting for multiple results in order to interpret others.  Please give [place] 48 hours in order for your provider to thoroughly review all the results before contacting the office for clarification of your results.     Thank you for choosing me and [facility].  Gerrit Heck, D.O. Your provider has requested that you go to the basement level for lab work before leaving today. Press "B" on the elevator. The lab is located at the first door on the left as you exit the elevator.

## 2022-03-02 NOTE — Addendum Note (Signed)
Addended by: Isaiah Serge D on: 03/02/2022 09:40 AM   Modules accepted: Orders

## 2022-03-02 NOTE — Addendum Note (Signed)
Addended by: Isaiah Serge D on: 03/02/2022 09:38 AM   Modules accepted: Orders

## 2022-03-02 NOTE — Progress Notes (Signed)
Chief Complaint:  Crohn's Disease   GI History: 31 year old female with aggressive phenotypic stricturing and penetrating type Crohn's Disease with upper tract and perianal modifiers, diagnosed 04/2020 (severe perianal disease with multiple perianal fistulous tracts, nontraversable stricture in transverse colon, duodenal strictures).   Evaluation to date: - TPMT: Normal - TB testing: Negative QuantiFERON gold 04/2020 - HBV status: Negative.  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  Bilateral inguinal lymphadenopathy, likely reactive.  Changes of chronic Crohn's disease with postinflammatory mesenteric scarring and mild lymphadenopathy.  No current bowel wall thickening or obstruction.  Mild eccentric wall thickening in the lower rectum and a few mildly enlarged mesorectal lymph nodes. - History of EIMs: None   Medications to date: Prednisone, Inflectra Current medications: Inflectra   Health Maintenance: - DEXA: Ordered previously; not completed - Vaccinations:      - Annual Flu Vaccine -UTD per patient      - Pneumococcal Vaccine -PCV13 given 10/2020. Second dose to be given by [facility] (not stocked here)       - Zoster vaccine if over age 26: Date N/A - Micronutrient eval:      - Annual Vit D, B6, iron panel: UTD      - Duodenal disease: calcium, folate: UTD      - Annual Pap (if immunosuppressed): Obtained through [facility] - Surveillance colonoscopy: Not due - Surveillance labs for immunomodulators: Lab check schedule as below - Annual depression screening: None - Annual Dermatology/Skin exam: Referral previously placed     Endoscopic History: - EGD (04/2020): Normal esophagus and stomach.  2 stenoses in duodenum (path: Active/chronic inflammation) - Colonoscopy (04/2020): Grade 3 hemorrhoids, perianal fistulae, scars from prior  fistulous tracts, severe, nontraversable stenosis in transverse colon (path: Severe active/chronic inflammatory change), mild sigmoid colitis, moderate colitis of distal rectum (path benign)    HPI:     Patient is a 31 y.o. female presenting to the [facility]ology Clinic for follow-up.  Last seen by me on 04/29/2021.  Was receiving Inflectra 10 mg/kilogram with periodic breakthrough towards the end of dosing.  ESR 86, CRP normal, infliximab level 4.9 with undetectable antibody in 05/2021.  Modified Inflectra to 10 mg/kg every 6 weeks.  Scheduled for repeat infliximab trough/antibody in 09/2021, but never went for the labs.  Recommended follow-up at 6 months, but was a no-show.  Recommended EGD/colonoscopy in April, but was a no-show.  Was referred for IV iron, but she never went for that either.  Was seen in the ER at [facility] on 09/27/2021 for large fluctuant abscess in the right gluteal region. - Normal CMP - WBC 14.6, H/H 9.9/32, MCV/RDW 77/16.7, PLT 346.  ANC 11.8 - HIV negative - CT pelvis: Rectal thickening and inflammation extending posteriorly to the skin of the right gluteal cleft, likely representing fistulization in the setting of Crohn's disease.  No drainable fluid collections identified. - She decided to leave [facility] ER AMA and went home and it eventually resolved and has not recurred  Today, she states she is doing really well.  No issues with medications.  Aside from the abscess/fistula over the summer (since resolved), no recent  symptoms since dose adjustment earlier this year as outlined above. Now main issue is "sluggish" stools. Can be oily and straining, so now she is using OTC laxative QOD. Feels fine when using the laxative. No hematochezia,  melena. No drainage. No n/v, abdominal pain.   Next infusion is later today.        Latest Ref Rng & Units 08/25/2021    4:31 PM 10/13/2020    3:17 PM 09/21/2020    1:59 PM  CMP  Glucose 70 - 99 mg/dL 89     BUN 6 - 20 mg/dL  14     Creatinine 0.57 - 1.00 mg/dL 0.75     Sodium 134 - 144 mmol/L 139     Potassium 3.5 - 5.2 mmol/L 4.2     Chloride 96 - 106 mmol/L 103     CO2 20 - 29 mmol/L 24     Calcium 8.7 - 10.2 mg/dL 8.9     Total Protein 6.0 - 8.5 g/dL 7.3  7.7  8.2   Total Bilirubin 0.0 - 1.2 mg/dL 0.3  0.4  0.3   Alkaline Phos 44 - 121 IU/L 70  57  65   AST 0 - 40 IU/L _0 ALT 0 - 32 IU/L _1 Latest Ref Rng & Units 08/25/2021    4:31 PM 12/31/2020    2:42 PM 10/13/2020    3:17 PM  CBC  WBC 3.4 - 10.8 x10E3/uL 6.0  7.3  7.4   Hemoglobin 11.1 - 15.9 g/dL 9.6  8.8 Repeated and verified X2.  9.1   Hematocrit 34.0 - 46.6 % 31.3  28.7  28.9   Platelets 150 - 450 x10E3/uL 375  312.0  299.0      Review of systems:     No chest pain, no SOB, no fevers, no urinary sx   Past Medical History:  Diagnosis Date   Anal stricture    Anemia    Cholelithiasis    External hemorrhoids    Genital condyloma, female    HPV in female    Hypertension    [employer] 03/11/2020    Patient's surgical history, family medical history, social history, medications and allergies were all reviewed in Epic    Current Outpatient Medications  Medication Sig Dispense Refill   cyanocobalamin (,VITAMIN B-12,) 1000 MCG/ML injection Inject 1 mL (1,000 mcg total) into the muscle every 14 (fourteen) days. 6 mL 0   inFLIXimab-dyyb (INFLECTRA IV) Inject 5 mg into the vein. Taken at [facility] infusion     PEG-KCl-NaCl-NaSulf-Na Asc-C (PLENVU) 140 g SOLR Take 1 kit by mouth as directed. 1 each 0   No current facility-administered medications for this visit.    Physical Exam:     There were no vitals taken for this visit.  GENERAL:  Pleasant female in NAD Musculoskeletal:  Normal muscle tone, normal strength NEURO: Alert and oriented x 3, no focal neurologic deficits   IMPRESSION and PLAN:    1) Crohn's Disease 2) Transverse colon stricture 3) Duodenal strictures 4) Perianal fistulous 31 year old  female with aggressive phenotypic stricturing and penetrating type Crohn's Disease with upper tract and perianal modifiers and multiple vitamin deficiencies.   - Check infliximab trough and AB today (infusion is later today) for corrective drug monitoring and response to most recent changing dosing - Schedule EGD - Schedule colonoscopy.  To be done with pediatric colonoscope given history of known traversable colon stricture - DEXA scan - Historically ESR elevated and CRP normal during flare.   - Continue Inflectra 10 mg/kilogram every 6 weeks as scheduled - Strongly encouraged her to establish with new PCM -  May still need to be seen again in follow-up by Dr. Morton Stall at [facility] depending on repeat colonoscopy findings   5) Vitamin D deficiency - Encouraged her to make appoint with Endocrinology for persistent vitamin D deficiency despite ergocalciferol  6) B12 deficiency - Has been treated with B12 injections - Repeat B12 check today  7) Iron deficiency - Repeat CBC and iron panel to re-evaluate need for IV iron - Referral previously placed for IV iron with Hematology.  Encouraged her to make appointment again for IV iron pending repeat labs today   The indications, risks, and benefits of EGD and colonoscopy were explained to the patient in detail. Risks include but are not limited to bleeding, perforation, adverse reaction to medications, and cardiopulmonary compromise. Sequelae include but are not limited to the possibility of surgery, hospitalization, and mortality. The patient verbalized understanding and wished to proceed. All questions answered, referred to scheduler and bowel prep ordered. Further recommendations pending results of the exam.           Lavena Bullion ,DO, FACG 03/02/2022, 8:50 AM

## 2022-03-04 ENCOUNTER — Other Ambulatory Visit: Payer: Self-pay

## 2022-03-04 DIAGNOSIS — E538 Deficiency of other specified B group vitamins: Secondary | ICD-10-CM

## 2022-03-04 DIAGNOSIS — D509 Iron deficiency anemia, unspecified: Secondary | ICD-10-CM

## 2022-03-04 MED ORDER — CYANOCOBALAMIN 1000 MCG/ML IJ SOLN: INTRAMUSCULAR | 0 refills | Status: AC

## 2022-03-07 ENCOUNTER — Telehealth: Payer: Self-pay | Admitting: Pharmacy Technician

## 2022-03-07 NOTE — Telephone Encounter (Signed)
Auth Submission: NO AUTH NEEDED Payer: [employer] Medication & CPT/J Code(s) submitted: Feraheme (ferumoxytol) [id number] Route of submission (phone, fax, portal):  Phone # [phone] Fax # Auth type: Buy/Bill Units/visits requested: X2 Reference number: [id number] Approval from: 03/07/22 to 03/28/23

## 2022-03-17 ENCOUNTER — Ambulatory Visit (INDEPENDENT_AMBULATORY_CARE_PROVIDER_SITE_OTHER): Payer: Medicaid Other

## 2022-03-17 VITALS — BP 133/82 | HR 66 | Temp 98.0°F | Resp 18 | Ht 63.0 in | Wt 242.6 lb

## 2022-03-17 DIAGNOSIS — D509 Iron deficiency anemia, unspecified: Secondary | ICD-10-CM

## 2022-03-17 MED ORDER — SODIUM CHLORIDE 0.9 % IV SOLN
510.0000 mg | Freq: Once | INTRAVENOUS | Status: AC
  Administered 2022-03-17: 510 mg via INTRAVENOUS
  Filled 2022-03-17: qty 17

## 2022-03-17 MED ORDER — DIPHENHYDRAMINE HCL 25 MG PO CAPS
25.0000 mg | ORAL_CAPSULE | Freq: Once | ORAL | Status: AC
  Administered 2022-03-17: 25 mg via ORAL
  Filled 2022-03-17: qty 1

## 2022-03-17 MED ORDER — ACETAMINOPHEN 325 MG PO TABS
650.0000 mg | ORAL_TABLET | Freq: Once | ORAL | Status: AC
  Administered 2022-03-17: 650 mg via ORAL
  Filled 2022-03-17: qty 2

## 2022-03-17 NOTE — Progress Notes (Signed)
Diagnosis: Iron Deficiency Anemia  Provider:  Marshell Garfinkel MD  Procedure: Infusion  IV Type: Peripheral, IV Location: L Antecubital  Feraheme (Ferumoxytol), Dose: 510 mg  Infusion Start Time: 0757  Infusion Stop Time: 3225  Post Infusion IV Care: Observation period completed and Peripheral IV Discontinued  Discharge: Condition: Good, Destination: Home . AVS provided to patient.   Performed by:  Arnoldo Morale, RN

## 2022-04-06 ENCOUNTER — Encounter: Payer: Self-pay | Admitting: Gastroenterology

## 2022-04-13 ENCOUNTER — Encounter: Payer: Self-pay | Admitting: Gastroenterology

## 2022-04-13 ENCOUNTER — Ambulatory Visit (AMBULATORY_SURGERY_CENTER): Payer: Medicaid Other | Admitting: Gastroenterology

## 2022-04-13 VITALS — BP 142/70 | HR 60 | Temp 96.9°F | Resp 13 | Ht 63.0 in | Wt 243.0 lb

## 2022-04-13 DIAGNOSIS — D509 Iron deficiency anemia, unspecified: Secondary | ICD-10-CM

## 2022-04-13 DIAGNOSIS — K2950 Unspecified chronic gastritis without bleeding: Secondary | ICD-10-CM

## 2022-04-13 DIAGNOSIS — K315 Obstruction of duodenum: Secondary | ICD-10-CM

## 2022-04-13 DIAGNOSIS — E538 Deficiency of other specified B group vitamins: Secondary | ICD-10-CM

## 2022-04-13 DIAGNOSIS — K641 Second degree hemorrhoids: Secondary | ICD-10-CM

## 2022-04-13 DIAGNOSIS — E559 Vitamin D deficiency, unspecified: Secondary | ICD-10-CM

## 2022-04-13 DIAGNOSIS — K56699 Other intestinal obstruction unspecified as to partial versus complete obstruction: Secondary | ICD-10-CM

## 2022-04-13 DIAGNOSIS — K603 Anal fistula: Secondary | ICD-10-CM | POA: Diagnosis not present

## 2022-04-13 DIAGNOSIS — K529 Noninfective gastroenteritis and colitis, unspecified: Secondary | ICD-10-CM | POA: Diagnosis not present

## 2022-04-13 DIAGNOSIS — K50019 Crohn's disease of small intestine with unspecified complications: Secondary | ICD-10-CM

## 2022-04-13 MED ORDER — SODIUM CHLORIDE 0.9 % IV SOLN: 500.0000 mL | Freq: Once | INTRAVENOUS | Status: DC

## 2022-04-13 NOTE — Progress Notes (Signed)
Pt's states no medical or surgical changes since previsit or office visit. 

## 2022-04-13 NOTE — Progress Notes (Signed)
Report to PACU, RN, vss, BBS= Clear.  

## 2022-04-13 NOTE — Op Note (Signed)
[place] Patient Name: Elizibeth Breau Procedure Date: 04/13/2022 3:35 PM MRN: [id number] Endoscopist: Gerrit Heck , MD, [id number] Age: 32 Referring MD:  Date of Birth: April 01, 1990 Gender: Female Account #: [id number] Procedure:                Upper GI endoscopy w/ biopsy Indications:              Iron deficiency anemia, Crohn's disease, B12                            deficiency, Vitamin D deficiency                           Hx of duodenal strictures 2/2 Crohns Disease.                            Evaluate response to therapy (Inflectra). Medicines:                Monitored Anesthesia Care Procedure:                Pre-Anesthesia Assessment:                           - Prior to the procedure, a History and Physical                            was performed, and patient medications and                            allergies were reviewed. The patient's tolerance of                            previous anesthesia was also reviewed. The risks                            and benefits of the procedure and the sedation                            options and risks were discussed with the patient.                            All questions were answered, and informed consent                            was obtained. Prior Anticoagulants: The patient has                            taken no anticoagulant or antiplatelet agents. ASA                            Grade Assessment: II - A patient with mild systemic                            disease. After reviewing the risks and benefits,  the patient was deemed in satisfactory condition to                            undergo the procedure.                           After obtaining informed consent, the endoscope was                            passed under direct vision. Throughout the                            procedure, the patient's blood pressure, pulse, and                            oxygen saturations were  monitored continuously. The                            Endoscope was introduced through the mouth, and                            advanced to the third part of duodenum. The upper                            GI endoscopy was accomplished without difficulty.                            The patient tolerated the procedure well. Scope In: Scope Out: Findings:                 The examined esophagus was normal.                           The entire examined stomach was normal. Biopsies                            were taken with a cold forceps for histology.                            Estimated blood loss was minimal.                           A subtle, acquired, benign-appearing, intrinsic                            mild stenosis was found in the second portion of                            the duodenum and was traversed. This was much                            improved compared with the previous study.                           Normal mucosa was found in  the entire duodenum.                            Biopsies were taken with a cold forceps for                            histology. Estimated blood loss was minimal. Complications:            No immediate complications. Estimated Blood Loss:     Estimated blood loss was minimal. Impression:               - Normal esophagus.                           - Normal stomach. Biopsied.                           - Acquired duodenal stenosis.                           - Normal mucosa was found in the entire examined                            duodenum. Biopsied. Recommendation:           - Patient has a contact number available for                            emergencies. The signs and symptoms of potential                            delayed complications were discussed with the                            patient. Return to normal activities tomorrow.                            Written discharge instructions were provided to the                             patient.                           - Resume previous diet.                           - Continue present medications.                           - Await pathology results.                           - Perform a colonoscopy today. Gerrit Heck, MD 04/13/2022 4:23:20 PM

## 2022-04-13 NOTE — Progress Notes (Signed)
Called to room to assist during endoscopic procedure.  Patient ID and intended procedure confirmed with present staff. Received instructions for my participation in the procedure from the performing physician.  

## 2022-04-13 NOTE — Patient Instructions (Addendum)
HANDOUTS PROVIDED ON: Hemorrhoids  The biopsies taken today have been sent for pathology.  The results can take 1-3 weeks to receive.  When your next colonoscopy should occur will be based on the pathology results.    You may resume your previous diet and medication schedule.  Thank you for allowing [place] to care for you today!!!   YOU HAD AN ENDOSCOPIC PROCEDURE TODAY AT [facility]:   Refer to the procedure report that was given to you for any specific questions about what was found during the examination.  If the procedure report does not answer your questions, please call your gastroenterologist to clarify.  If you requested that your care partner not be given the details of your procedure findings, then the procedure report has been included in a sealed envelope for you to review at your convenience later.  YOU SHOULD EXPECT: Some feelings of bloating in the abdomen. Passage of more gas than usual.  Walking can help get rid of the air that was put into your GI tract during the procedure and reduce the bloating. If you had a lower endoscopy (such as a colonoscopy or flexible sigmoidoscopy) you may notice spotting of blood in your stool or on the toilet paper. If you underwent a bowel prep for your procedure, you may not have a normal bowel movement for a few days.  Please Note:  You might notice some irritation and congestion in your nose or some drainage.  This is from the oxygen used during your procedure.  There is no need for concern and it should clear up in a day or so.  SYMPTOMS TO REPORT IMMEDIATELY:  Following lower endoscopy (colonoscopy or flexible sigmoidoscopy):  Excessive amounts of blood in the stool  Significant tenderness or worsening of abdominal pains  Swelling of the abdomen that is new, acute  Fever of 100F or higher  Following upper endoscopy (EGD)  Vomiting of blood or coffee ground material  New chest pain or pain under the shoulder blades  Painful  or persistently difficult swallowing  New shortness of breath  Fever of 100F or higher  Black, tarry-looking stools  For urgent or emergent issues, a gastroenterologist can be reached at any hour by calling [phone]. Do not use MyChart messaging for urgent concerns.    DIET:  We do recommend a small meal at first, but then you may proceed to your regular diet.  Drink plenty of fluids but you should avoid alcoholic beverages for 24 hours.  ACTIVITY:  You should plan to take it easy for the rest of today and you should NOT DRIVE or use heavy machinery until tomorrow (because of the sedation medicines used during the test).    FOLLOW UP: Our staff will call the number listed on your records the next business day following your procedure.  We will call around 7:15- 8:00 am to check on you and address any questions or concerns that you may have regarding the information given to you following your procedure. If we do not reach you, we will leave a message.     If any biopsies were taken you will be contacted by phone or by letter within the next 1-3 weeks.  Please call us at [phone] if you have not heard about the biopsies in 3 weeks.    SIGNATURES/CONFIDENTIALITY: You and/or your care partner have signed paperwork which will be entered into your electronic medical record.  These signatures attest to the fact that  that the information above on your After Visit Summary has been reviewed and is understood.  Full responsibility of the confidentiality of this discharge information lies with you and/or your care-partner.

## 2022-04-13 NOTE — Progress Notes (Signed)
GASTROENTEROLOGY PROCEDURE H&P NOTE   Primary Care Physician: Maximiano Coss, NP    Reason for Procedure:  Fistulizing, stricturing Crohn's disease, duodenal strictures, transverse colon stricture, vitamin D deficiency, B12 deficiency, IDA  Plan:    EGD, colonoscopy  Patient is appropriate for endoscopic procedure(s) in the ambulatory ([place]) setting.  The nature of the procedure, as well as the risks, benefits, and alternatives were carefully and thoroughly reviewed with the patient. Ample time for discussion and questions allowed. The patient understood, was satisfied, and agreed to proceed.     HPI: Amber Blanchard is a 32 y.o. female who presents for EGD and colonoscopy for evaluation of aggressive phenotypic Crohn's Disease (stricturing and penetrating type) complicated by duodenal strictures and multiple vitamin deficiencies and anemia.  Past Medical History:  Diagnosis Date   Anal stricture    Anemia    Cholelithiasis    External hemorrhoids    Genital condyloma, female    HPV in female    Hypertension    Phreesia 03/11/2020    Past Surgical History:  Procedure Laterality Date   CESAREAN SECTION  10/21/2010   Procedure: CESAREAN SECTION;  Surgeon: Catha Brow;  Location: [facility];  Service: Gynecology;  Laterality: N/A;   CESAREAN SECTION WITH BILATERAL TUBAL LIGATION Bilateral 02/20/2015   Procedure: CESAREAN SECTION WITH BILATERAL TUBAL LIGATION;  Surgeon: Woodroe Mode, MD;  Location: [facility];  Service: Obstetrics;  Laterality: Bilateral;    Prior to Admission medications   Medication Sig Start Date End Date Taking? Authorizing Provider  cyanocobalamin (VITAMIN B12) 1000 MCG/ML injection Inject 1 mL (1,000 mcg total) into the muscle once a week for 28 days, THEN 1 mL (1,000 mcg total) every 30 (thirty) days. 03/04/22 05/31/22  Leilany Digeronimo V, DO  inFLIXimab-dyyb (INFLECTRA IV) Inject 5 mg into the vein. Taken at [employer], Historical, MD   PEG-KCl-NaCl-NaSulf-Na Asc-C (PLENVU) 140 g SOLR Take 1 kit by mouth as directed. 07/01/21   Timarie Labell, Dominic Pea, DO    Current Outpatient Medications  Medication Sig Dispense Refill   cyanocobalamin (VITAMIN B12) 1000 MCG/ML injection Inject 1 mL (1,000 mcg total) into the muscle once a week for 28 days, THEN 1 mL (1,000 mcg total) every 30 (thirty) days. 6 mL 0   inFLIXimab-dyyb (INFLECTRA IV) Inject 5 mg into the vein. Taken at [employer] infusion     PEG-KCl-NaCl-NaSulf-Na Asc-C (PLENVU) 140 g SOLR Take 1 kit by mouth as directed. 1 each 0   Current Facility-Administered Medications  Medication Dose Route Frequency Provider Last Rate Last Admin   0.9 %  sodium chloride infusion  500 mL Intravenous Once Taijon Vink V, DO        Allergies as of 04/13/2022   (No Known Allergies)    Family History  Problem Relation Age of Onset   Healthy Mother    Hypertension Father    Healthy Sister    Healthy Brother    Colon cancer Neg Hx    Esophageal cancer Neg Hx    Rectal cancer Neg Hx    Stomach cancer Neg Hx     Social History   Socioeconomic History   Marital status: Single    Spouse name: Not on file   Number of children: 2   Years of education: Not on file   Highest education level: Not on file  Occupational History   Not on file  Tobacco Use   Smoking status: Never   Smokeless tobacco:  Never  Vaping Use   Vaping Use: Every day  Substance and Sexual Activity   Alcohol use: Yes    Alcohol/week: 5.0 standard drinks of alcohol    Types: 3 Glasses of wine, 2 Shots of liquor per week    Comment: socially   Drug use: Not Currently    Frequency: 7.0 times per week    Types: Marijuana    Comment: had some at 0230 today   Sexual activity: Not Currently  Other Topics Concern   Not on file  Social History Narrative   Not on file   Social Determinants of Health   Financial Resource Strain: Not on file  Food Insecurity: Not on file  Transportation Needs: Not on file   Physical Activity: Not on file  Stress: Not on file  Social Connections: Not on file  Intimate Partner Violence: Not on file    Physical Exam: Vital signs in last 24 hours: @There  were no vitals taken for this visit. GEN: NAD EYE: Sclerae anicteric ENT: MMM CV: Non-tachycardic Pulm: CTA b/l GI: Soft, NT/ND NEURO:  Alert & Oriented x 3   Gerrit Heck, DO [facility]   04/13/2022 3:18 PM

## 2022-04-13 NOTE — Op Note (Signed)
Amber Blanchard Patient Name: Amber Blanchard Procedure Date: 04/13/2022 3:35 PM MRN: [id number] Endoscopist: Gerrit Heck , MD, [id number] Age: 32 Referring MD:  Date of Birth: 09-23-1990 Gender: Female Account #: [id number] Procedure:                Colonoscopy w/ biopsy Indications:              Iron deficiency anemia, Disease activity assessment                            of Crohn's disease of the small bowel and colon,                            Assess therapeutic response to therapy (Inflectra)                            of Crohn's disease of the small bowel and colon Medicines:                Monitored Anesthesia Care Procedure:                Pre-Anesthesia Assessment:                           - Prior to the procedure, a History and Physical                            was performed, and patient medications and                            allergies were reviewed. The patient's tolerance of                            previous anesthesia was also reviewed. The risks                            and benefits of the procedure and the sedation                            options and risks were discussed with the patient.                            All questions were answered, and informed consent                            was obtained. Prior Anticoagulants: The patient has                            taken no anticoagulant or antiplatelet agents. ASA                            Grade Assessment: II - A patient with mild systemic                            disease. After reviewing the risks and benefits,  the patient was deemed in satisfactory condition to                            undergo the procedure.                           After obtaining informed consent, the colonoscope                            was passed under direct vision. Throughout the                            procedure, the patient's blood pressure, pulse, and                             oxygen saturations were monitored continuously. The                            0441 [id number]-[id number] Slim SB Colonoscope was introduced                            through the anus and advanced to the the cecum,                            identified by appendiceal orifice and ileocecal                            valve. The colonoscopy was technically difficult                            and complex due to significant looping. The patient                            tolerated the procedure well. The quality of the                            bowel preparation was good. The ileocecal valve,                            appendiceal orifice, and rectum were photographed. Scope In: 3:50:59 PM Scope Out: 4:13:27 PM Scope Withdrawal Time: 0 hours 13 minutes 39 seconds  Total Procedure Duration: 0 hours 22 minutes 28 seconds  Findings:                 The perianal exam findings include hemorrhoids and                            scarring from prior perianal fistulous tracts.                           A benign-appearing, intrinsic moderate stenosis                            measuring 1 cm (in length) x 6 mm (inner diameter)  was found in the proximal ascending colon and was                            traversed using the ultraslim colonoscope. Biopsies                            were taken with a cold forceps for histology adn to                            fracture the stricture. Estimated blood loss was                            minimal.                           Normal mucosa was found in the entire colon.                            Biopsies were taken with a cold forceps for                            histology. Estimated blood loss was minimal. The                            cecum was normal appearing. Could not intubate the                            terminal ileum due to the location and fibrotic                            nature of the proximal ascending colon stricture.                            Non-bleeding internal hemorrhoids were found during                            retroflexion. The hemorrhoids were small and Grade                            II (internal hemorrhoids that prolapse but reduce                            spontaneously). Complications:            No immediate complications. Estimated Blood Loss:     Estimated blood loss was minimal. Impression:               - Perianal scarring from prior fistulae were found                            on perianal exam.                           - Stricture in the proximal ascending colon.  Biopsied.                           - Normal mucosa in the entire examined colon.                            Biopsied.                           - Non-bleeding internal hemorrhoids. Recommendation:           - Patient has a contact number available for                            emergencies. The signs and symptoms of potential                            delayed complications were discussed with the                            patient. Return to normal activities tomorrow.                            Written discharge instructions were provided to the                            patient.                           - Resume previous diet.                           - Continue present medications.                           - Await pathology results.                           - Repeat colonoscopy for surveillance based on                            pathology results.                           - Return to GI clinic in 6 months. Gerrit Heck, MD 04/13/2022 4:30:26 PM

## 2022-04-14 ENCOUNTER — Telehealth: Payer: Self-pay

## 2022-04-14 NOTE — Telephone Encounter (Signed)
  Follow up Call-     04/13/2022    3:22 PM 05/15/2020    7:23 AM  Call back number  Post procedure Call Back phone  # [phone]  Permission to leave phone message Yes Yes    Post op call attempted, no answer, left WM.

## 2022-05-30 ENCOUNTER — Encounter: Payer: Self-pay | Admitting: Gastroenterology

## 2022-06-08 ENCOUNTER — Ambulatory Visit: Payer: Medicaid Other | Admitting: Obstetrics and Gynecology

## 2022-06-16 ENCOUNTER — Telehealth: Payer: Self-pay

## 2022-06-16 NOTE — Telephone Encounter (Signed)
MyChart message sent to patient with lab reminder.  

## 2022-06-16 NOTE — Telephone Encounter (Signed)
-----   Message from Algernon Huxley, RN sent at 06/16/2022  1:06 PM EDT ----- Bryan Lemma pt  ----- Message ----- From: Marice Potter, RN Sent: 06/03/2022  12:00 AM EDT To: Marice Potter, RN  Pt needs labs. Orders placed.

## 2022-06-22 NOTE — Telephone Encounter (Signed)
Lab reminder letter sent to patient via MyChart and a copy has been mailed.

## 2022-07-08 ENCOUNTER — Ambulatory Visit: Payer: Medicaid Other | Admitting: Podiatry

## 2022-07-22 ENCOUNTER — Ambulatory Visit: Payer: Medicaid Other | Admitting: Podiatry

## 2022-09-13 ENCOUNTER — Other Ambulatory Visit (INDEPENDENT_AMBULATORY_CARE_PROVIDER_SITE_OTHER): Payer: Medicaid Other

## 2022-09-13 DIAGNOSIS — K50019 Crohn's disease of small intestine with unspecified complications: Secondary | ICD-10-CM

## 2022-09-13 DIAGNOSIS — D509 Iron deficiency anemia, unspecified: Secondary | ICD-10-CM | POA: Diagnosis not present

## 2022-09-13 DIAGNOSIS — E538 Deficiency of other specified B group vitamins: Secondary | ICD-10-CM

## 2022-09-13 LAB — VITAMIN B12: Vitamin B-12: 342 pg/mL (ref 211–911)

## 2022-09-13 LAB — IBC + FERRITIN
Ferritin: 20.4 ng/mL (ref 10.0–291.0)
Iron: 54 ug/dL (ref 42–145)
Saturation Ratios: 15.7 % — ABNORMAL LOW (ref 20.0–50.0)
TIBC: 343 ug/dL (ref 250.0–450.0)
Transferrin: 245 mg/dL (ref 212.0–360.0)

## 2022-09-13 LAB — CBC WITH DIFFERENTIAL/PLATELET
Basophils Absolute: 0.1 10*3/uL (ref 0.0–0.1)
Basophils Relative: 0.4 % (ref 0.0–3.0)
Eosinophils Absolute: 0.1 10*3/uL (ref 0.0–0.7)
Eosinophils Relative: 0.6 % (ref 0.0–5.0)
HCT: 37.8 % (ref 36.0–46.0)
Hemoglobin: 12.3 g/dL (ref 12.0–15.0)
Lymphocytes Relative: 15 % (ref 12.0–46.0)
Lymphs Abs: 1.9 10*3/uL (ref 0.7–4.0)
MCHC: 32.6 g/dL (ref 30.0–36.0)
MCV: 86.6 fl (ref 78.0–100.0)
Monocytes Absolute: 0.9 10*3/uL (ref 0.1–1.0)
Monocytes Relative: 7.2 % (ref 3.0–12.0)
Neutro Abs: 9.9 10*3/uL — ABNORMAL HIGH (ref 1.4–7.7)
Neutrophils Relative %: 76.8 % (ref 43.0–77.0)
Platelets: 296 10*3/uL (ref 150.0–400.0)
RBC: 4.36 Mil/uL (ref 3.87–5.11)
RDW: 14.5 % (ref 11.5–15.5)
WBC: 12.9 10*3/uL — ABNORMAL HIGH (ref 4.0–10.5)

## 2022-09-16 ENCOUNTER — Telehealth: Payer: Self-pay | Admitting: Gastroenterology

## 2022-09-16 NOTE — Telephone Encounter (Signed)
Pt returned call. We reviewed lab results. Pt reports that when she came in for labs she did have an active abscess, since then that abscess has resolved, but she has developed a new one. Patient is aware that she will need to come back for repeat CBC in about 2 weeks. Pt is aware that we are awaiting her Infliximab levels to determine if dosing of Infliximab needs to be changed. Patient has been scheduled for a follow up appt on Friday, 11/18/22 at 11:20 am. Pt verbalized understanding and had no concerns at the end of the call.

## 2022-09-16 NOTE — Telephone Encounter (Signed)
Inbound call from patient wishing for a call back regarding medication. States she does not think her medication has been helping. Offered 8/6 appointment but wants to speak with a nurse instead because she will be due for another infusion before then. Please advise, thank you.

## 2022-09-16 NOTE — Telephone Encounter (Signed)
09/13/22 lab results:  Labs reviewed.  WBC elevated at 12.9 with increased absolute neutrophil count at 9.9%.  Otherwise hemoglobin has since normalized, now 12.3, along with normal platelets. Iron panel much improved, with normal iron, ferritin, and saturation now 15.7%.  Normal B12.  Infliximab antibody/trough pending.   Please check in with the patient to see if she has had any recent infection type symptoms as cause for her elevated WBC.  Otherwise feeling in her usual state of health, can repeat CBC in 1-2 weeks to ensure that leukocytosis resolves.  Can otherwise keep follow-up appointment in GI clinic as scheduled.

## 2022-09-16 NOTE — Telephone Encounter (Signed)
Lm on vm for patient to return call 

## 2022-09-23 LAB — SERIAL MONITORING

## 2022-09-24 LAB — INFLIXIMAB+AB (SERIAL MONITOR)
Anti-Infliximab Antibody: <22 ng/mL
Infliximab Drug Level: 111 ug/mL

## 2022-10-05 ENCOUNTER — Telehealth: Payer: Self-pay | Admitting: Gastroenterology

## 2022-10-05 NOTE — Telephone Encounter (Signed)
Colonic Crohn's disease noted with stricture in the ascending colon On Inflectra Patient of Dr. Barron Alvine  Would recommend MiraLAX 17 g; 3 doses today and then 3 doses daily until she has good bowel movements After that may want to continue 17 g once daily Would recommend that she call us back and may need to go to the emergency department should she develop increased abdominal bloating, increased abdominal pain, vomiting or passing no gas from below  In the interim low residue/low fiber diet with focus on hydration

## 2022-10-05 NOTE — Telephone Encounter (Signed)
PT is requesting call back to discuss her chron's symptoms. Please advise.

## 2022-10-05 NOTE — Telephone Encounter (Signed)
Returned patient call in reference to her chron's disease. Patient states she has not had a BM in 3 days, passing gas, no BM, noted pressure as if she could have a BM, but not able to. Patient also states she is nauseated.

## 2022-10-05 NOTE — Telephone Encounter (Signed)
Called patient to inform recommendations per Dr. Rhea Belton, patient not available. VM left to call back.

## 2022-10-06 ENCOUNTER — Encounter: Payer: Self-pay | Admitting: *Deleted

## 2022-10-06 NOTE — Telephone Encounter (Signed)
Patient notified via MyChart, due to inability to contact via phone. Patient initially called in reference to not having had a BM in 3 days with having complaints of noted pressure as if she is able to have a BM, but unable to do so. Patient also states she is passing gas. Nurse called back with recommendations per Dr. Rhea Belton, but unable to reach patient x'3, including sending a messaged via MyChart.

## 2022-10-11 ENCOUNTER — Other Ambulatory Visit (INDEPENDENT_AMBULATORY_CARE_PROVIDER_SITE_OTHER): Payer: Medicaid Other

## 2022-10-11 ENCOUNTER — Other Ambulatory Visit: Payer: Self-pay

## 2022-10-11 DIAGNOSIS — D72829 Elevated white blood cell count, unspecified: Secondary | ICD-10-CM | POA: Diagnosis not present

## 2022-10-11 DIAGNOSIS — K50019 Crohn's disease of small intestine with unspecified complications: Secondary | ICD-10-CM

## 2022-10-11 LAB — CBC WITH DIFFERENTIAL/PLATELET
Basophils Absolute: 0 10*3/uL (ref 0.0–0.1)
Basophils Relative: 0.5 % (ref 0.0–3.0)
Eosinophils Absolute: 0.1 10*3/uL (ref 0.0–0.7)
Eosinophils Relative: 0.9 % (ref 0.0–5.0)
HCT: 36.4 % (ref 36.0–46.0)
Hemoglobin: 11.9 g/dL — ABNORMAL LOW (ref 12.0–15.0)
Lymphocytes Relative: 29.8 % (ref 12.0–46.0)
Lymphs Abs: 2.2 10*3/uL (ref 0.7–4.0)
MCHC: 32.7 g/dL (ref 30.0–36.0)
MCV: 85.9 fl (ref 78.0–100.0)
Monocytes Absolute: 0.7 10*3/uL (ref 0.1–1.0)
Monocytes Relative: 9.4 % (ref 3.0–12.0)
Neutro Abs: 4.4 10*3/uL (ref 1.4–7.7)
Neutrophils Relative %: 59.4 % (ref 43.0–77.0)
Platelets: 296 10*3/uL (ref 150.0–400.0)
RBC: 4.24 Mil/uL (ref 3.87–5.11)
RDW: 14.3 % (ref 11.5–15.5)
WBC: 7.4 10*3/uL (ref 4.0–10.5)

## 2022-10-18 ENCOUNTER — Encounter: Payer: Self-pay | Admitting: Gastroenterology

## 2022-10-20 ENCOUNTER — Encounter: Payer: Self-pay | Admitting: Gastroenterology

## 2022-10-20 ENCOUNTER — Ambulatory Visit: Payer: Medicaid Other | Attending: Internal Medicine | Admitting: Internal Medicine

## 2022-10-20 ENCOUNTER — Observation Stay (HOSPITAL_BASED_OUTPATIENT_CLINIC_OR_DEPARTMENT_OTHER): Payer: Medicaid Other | Admitting: Anesthesiology

## 2022-10-20 ENCOUNTER — Observation Stay (HOSPITAL_BASED_OUTPATIENT_CLINIC_OR_DEPARTMENT_OTHER)
Admission: EM | Admit: 2022-10-20 | Discharge: 2022-10-21 | Disposition: A | Discharge status: Home / Self Care | Payer: Medicaid Other | Attending: General Surgery | Admitting: General Surgery

## 2022-10-20 ENCOUNTER — Encounter: Payer: Self-pay | Admitting: Internal Medicine

## 2022-10-20 ENCOUNTER — Other Ambulatory Visit (HOSPITAL_BASED_OUTPATIENT_CLINIC_OR_DEPARTMENT_OTHER): Payer: Self-pay

## 2022-10-20 ENCOUNTER — Other Ambulatory Visit: Payer: Self-pay

## 2022-10-20 ENCOUNTER — Encounter (HOSPITAL_BASED_OUTPATIENT_CLINIC_OR_DEPARTMENT_OTHER): Payer: Self-pay | Admitting: Emergency Medicine

## 2022-10-20 ENCOUNTER — Emergency Department (HOSPITAL_BASED_OUTPATIENT_CLINIC_OR_DEPARTMENT_OTHER): Payer: Medicaid Other

## 2022-10-20 ENCOUNTER — Encounter (HOSPITAL_COMMUNITY)
Admission: EM | Disposition: A | Discharge status: Home / Self Care | Payer: Self-pay | Source: Home / Self Care | Attending: Emergency Medicine

## 2022-10-20 ENCOUNTER — Observation Stay (HOSPITAL_COMMUNITY): Payer: Medicaid Other | Admitting: Anesthesiology

## 2022-10-20 DIAGNOSIS — F17290 Nicotine dependence, other tobacco product, uncomplicated: Secondary | ICD-10-CM

## 2022-10-20 DIAGNOSIS — K603 Anal fistula: Secondary | ICD-10-CM | POA: Insufficient documentation

## 2022-10-20 DIAGNOSIS — K50114 Crohn's disease of large intestine with abscess: Secondary | ICD-10-CM | POA: Diagnosis present

## 2022-10-20 DIAGNOSIS — L0231 Cutaneous abscess of buttock: Secondary | ICD-10-CM | POA: Diagnosis not present

## 2022-10-20 DIAGNOSIS — I10 Essential (primary) hypertension: Secondary | ICD-10-CM

## 2022-10-20 DIAGNOSIS — K611 Rectal abscess: Principal | ICD-10-CM | POA: Insufficient documentation

## 2022-10-20 DIAGNOSIS — K50019 Crohn's disease of small intestine with unspecified complications: Secondary | ICD-10-CM

## 2022-10-20 DIAGNOSIS — Z6841 Body Mass Index (BMI) 40.0 and over, adult: Secondary | ICD-10-CM | POA: Diagnosis not present

## 2022-10-20 HISTORY — PX: INCISION AND DRAINAGE PERIRECTAL ABSCESS: SHX1804

## 2022-10-20 LAB — CBC
HCT: 33.9 % — ABNORMAL LOW (ref 36.0–46.0)
Hemoglobin: 11.4 g/dL — ABNORMAL LOW (ref 12.0–15.0)
MCH: 28.5 pg (ref 26.0–34.0)
MCHC: 33.6 g/dL (ref 30.0–36.0)
MCV: 84.8 fL (ref 80.0–100.0)
Platelets: 342 10*3/uL (ref 150–400)
RBC: 4 MIL/uL (ref 3.87–5.11)
RDW: 13.2 % (ref 11.5–15.5)
WBC: 12.5 10*3/uL — ABNORMAL HIGH (ref 4.0–10.5)
nRBC: 0 % (ref 0.0–0.2)

## 2022-10-20 LAB — BASIC METABOLIC PANEL
Anion gap: 7 (ref 5–15)
BUN: 12 mg/dL (ref 6–20)
CO2: 26 mmol/L (ref 22–32)
Calcium: 8.9 mg/dL (ref 8.9–10.3)
Chloride: 101 mmol/L (ref 98–111)
Creatinine, Ser: 0.67 mg/dL (ref 0.44–1.00)
GFR, Estimated: >60 mL/min (ref >60)
Glucose, Bld: 103 mg/dL — ABNORMAL HIGH (ref 70–99)
Potassium: 3.3 mmol/L — ABNORMAL LOW (ref 3.5–5.1)
Sodium: 134 mmol/L — ABNORMAL LOW (ref 135–145)

## 2022-10-20 LAB — HCG, QUANTITATIVE, PREGNANCY: hCG, Beta Chain, Quant, S: 1 m[IU]/mL (ref <5)

## 2022-10-20 SURGERY — EXAM UNDER ANESTHESIA
Anesthesia: General | Site: Rectum

## 2022-10-20 MED ORDER — FENTANYL CITRATE (PF) 250 MCG/5ML IJ SOLN
INTRAMUSCULAR | Status: DC | PRN
  Administered 2022-10-20: 100 ug via INTRAVENOUS

## 2022-10-20 MED ORDER — MORPHINE SULFATE (PF) 2 MG/ML IV SOLN: 2.0000 mg | INTRAVENOUS | Status: DC | PRN

## 2022-10-20 MED ORDER — PROPOFOL 10 MG/ML IV BOLUS
INTRAVENOUS | Status: AC
  Filled 2022-10-20: qty 20

## 2022-10-20 MED ORDER — FENTANYL CITRATE (PF) 100 MCG/2ML IJ SOLN
25.0000 ug | INTRAMUSCULAR | Status: DC | PRN
  Administered 2022-10-20: 50 ug via INTRAVENOUS
  Administered 2022-10-20: 25 ug via INTRAVENOUS

## 2022-10-20 MED ORDER — AMISULPRIDE (ANTIEMETIC) 5 MG/2ML IV SOLN
10.0000 mg | Freq: Once | INTRAVENOUS | Status: AC | PRN
  Administered 2022-10-20: 10 mg via INTRAVENOUS

## 2022-10-20 MED ORDER — LACTATED RINGERS IV SOLN: INTRAVENOUS | Status: DC

## 2022-10-20 MED ORDER — ONDANSETRON 4 MG PO TBDP: 4.0000 mg | ORAL_TABLET | Freq: Four times a day (QID) | ORAL | Status: DC | PRN

## 2022-10-20 MED ORDER — ACETAMINOPHEN 500 MG PO TABS: 1000.0000 mg | ORAL_TABLET | Freq: Once | ORAL | Status: AC

## 2022-10-20 MED ORDER — MIDAZOLAM HCL 2 MG/2ML IJ SOLN
INTRAMUSCULAR | Status: AC
  Filled 2022-10-20: qty 2

## 2022-10-20 MED ORDER — CHLORHEXIDINE GLUCONATE 0.12 % MT SOLN
OROMUCOSAL | Status: AC
  Administered 2022-10-20: 15 mL via OROMUCOSAL
  Filled 2022-10-20: qty 15

## 2022-10-20 MED ORDER — SODIUM CHLORIDE 0.9 % IV SOLN: INTRAVENOUS | Status: DC

## 2022-10-20 MED ORDER — MORPHINE SULFATE (PF) 4 MG/ML IV SOLN
6.0000 mg | Freq: Once | INTRAVENOUS | Status: AC
  Administered 2022-10-20: 6 mg via INTRAVENOUS
  Filled 2022-10-20: qty 2

## 2022-10-20 MED ORDER — ORAL CARE MOUTH RINSE: 15.0000 mL | Freq: Once | OROMUCOSAL | Status: AC

## 2022-10-20 MED ORDER — ONDANSETRON HCL 4 MG/2ML IJ SOLN
INTRAMUSCULAR | Status: DC | PRN
  Administered 2022-10-20: 4 mg via INTRAVENOUS

## 2022-10-20 MED ORDER — MORPHINE SULFATE (PF) 4 MG/ML IV SOLN
4.0000 mg | Freq: Once | INTRAVENOUS | Status: AC
  Administered 2022-10-20: 4 mg via INTRAVENOUS
  Filled 2022-10-20: qty 1

## 2022-10-20 MED ORDER — ONDANSETRON HCL 4 MG/2ML IJ SOLN: 4.0000 mg | Freq: Four times a day (QID) | INTRAMUSCULAR | Status: DC | PRN

## 2022-10-20 MED ORDER — FENTANYL CITRATE (PF) 100 MCG/2ML IJ SOLN
INTRAMUSCULAR | Status: AC
  Filled 2022-10-20: qty 2

## 2022-10-20 MED ORDER — 0.9 % SODIUM CHLORIDE (POUR BTL) OPTIME
TOPICAL | Status: DC | PRN
  Administered 2022-10-20: 1000 mL

## 2022-10-20 MED ORDER — FENTANYL CITRATE (PF) 250 MCG/5ML IJ SOLN
INTRAMUSCULAR | Status: AC
  Filled 2022-10-20: qty 5

## 2022-10-20 MED ORDER — IOHEXOL 300 MG/ML  SOLN
100.0000 mL | Freq: Once | INTRAMUSCULAR | Status: AC | PRN
  Administered 2022-10-20: 100 mL via INTRAVENOUS

## 2022-10-20 MED ORDER — SUCCINYLCHOLINE 20MG/ML (10ML) SYRINGE FOR MEDFUSION PUMP - OPTIME
INTRAMUSCULAR | Status: DC | PRN
  Administered 2022-10-20: 120 mg via INTRAVENOUS

## 2022-10-20 MED ORDER — DEXAMETHASONE SODIUM PHOSPHATE 10 MG/ML IJ SOLN
INTRAMUSCULAR | Status: DC | PRN
  Administered 2022-10-20: 10 mg via INTRAVENOUS

## 2022-10-20 MED ORDER — ACETAMINOPHEN 500 MG PO TABS
ORAL_TABLET | ORAL | Status: AC
  Administered 2022-10-20: 1000 mg via ORAL
  Filled 2022-10-20: qty 2

## 2022-10-20 MED ORDER — AMISULPRIDE (ANTIEMETIC) 5 MG/2ML IV SOLN
INTRAVENOUS | Status: AC
  Filled 2022-10-20: qty 4

## 2022-10-20 MED ORDER — ACETAMINOPHEN 325 MG PO TABS
650.0000 mg | ORAL_TABLET | Freq: Four times a day (QID) | ORAL | Status: DC | PRN
  Filled 2022-10-20: qty 2

## 2022-10-20 MED ORDER — METRONIDAZOLE 500 MG/100ML IV SOLN
500.0000 mg | Freq: Two times a day (BID) | INTRAVENOUS | Status: DC
  Administered 2022-10-20 – 2022-10-21 (×2): 500 mg via INTRAVENOUS
  Filled 2022-10-20 (×2): qty 100

## 2022-10-20 MED ORDER — CHLORHEXIDINE GLUCONATE 0.12 % MT SOLN: 15.0000 mL | Freq: Once | OROMUCOSAL | Status: AC

## 2022-10-20 MED ORDER — LIDOCAINE 2% (20 MG/ML) 5 ML SYRINGE
INTRAMUSCULAR | Status: DC | PRN
  Administered 2022-10-20: 60 mg via INTRAVENOUS

## 2022-10-20 MED ORDER — OXYCODONE HCL 5 MG PO TABS
5.0000 mg | ORAL_TABLET | ORAL | Status: DC | PRN
  Administered 2022-10-20 – 2022-10-21 (×2): 10 mg via ORAL
  Administered 2022-10-21: 5 mg via ORAL
  Filled 2022-10-20 (×3): qty 2

## 2022-10-20 MED ORDER — CIPROFLOXACIN IN D5W 400 MG/200ML IV SOLN
400.0000 mg | Freq: Two times a day (BID) | INTRAVENOUS | Status: DC
  Administered 2022-10-20 – 2022-10-21 (×2): 400 mg via INTRAVENOUS
  Filled 2022-10-20 (×3): qty 200

## 2022-10-20 MED ORDER — PROPOFOL 10 MG/ML IV BOLUS
INTRAVENOUS | Status: DC | PRN
Start: 2022-10-20 — End: 2022-10-20
  Administered 2022-10-20: 150 mg via INTRAVENOUS

## 2022-10-20 MED ORDER — ACETAMINOPHEN 650 MG RE SUPP: 650.0000 mg | Freq: Four times a day (QID) | RECTAL | Status: DC | PRN

## 2022-10-20 MED ORDER — MIDAZOLAM HCL 2 MG/2ML IJ SOLN
INTRAMUSCULAR | Status: DC | PRN
  Administered 2022-10-20: 2 mg via INTRAVENOUS

## 2022-10-20 MED ORDER — HYDROMORPHONE HCL 1 MG/ML IJ SOLN
0.5000 mg | INTRAMUSCULAR | Status: DC | PRN
  Administered 2022-10-20 – 2022-10-21 (×2): 0.5 mg via INTRAVENOUS
  Filled 2022-10-20 (×2): qty 0.5

## 2022-10-20 MED ORDER — ENOXAPARIN SODIUM 40 MG/0.4ML IJ SOSY
40.0000 mg | PREFILLED_SYRINGE | INTRAMUSCULAR | Status: DC
  Administered 2022-10-21: 40 mg via SUBCUTANEOUS
  Filled 2022-10-20: qty 0.4

## 2022-10-20 SURGICAL SUPPLY — 53 items
BAG COUNTER SPONGE SURGICOUNT (BAG) ×1 IMPLANT
BAG SPNG CNTER NS LX DISP (BAG)
BNDG GAUZE DERMACEA FLUFF 4 (GAUZE/BANDAGES/DRESSINGS) IMPLANT
BNDG GZE DERMACEA 4 6PLY (GAUZE/BANDAGES/DRESSINGS) ×1
BRIEF MESH DISP LRG (UNDERPADS AND DIAPERS) ×1 IMPLANT
CANISTER SUCT 3000ML PPV (MISCELLANEOUS) ×1 IMPLANT
COVER SURGICAL LIGHT HANDLE (MISCELLANEOUS) ×1 IMPLANT
DRAIN PENROSE 18X1/4 LTX STRL (DRAIN) IMPLANT
DRAPE HALF SHEET 40X57 (DRAPES) ×1 IMPLANT
DRAPE UTILITY XL STRL (DRAPES) IMPLANT
ELECT CAUTERY BLADE 6.4 (BLADE) ×1 IMPLANT
ELECT REM PT RETURN 9FT ADLT (ELECTROSURGICAL) ×1
ELECTRODE REM PT RTRN 9FT ADLT (ELECTROSURGICAL) ×1 IMPLANT
GAUZE 4X4 16PLY ~~LOC~~+RFID DBL (SPONGE) ×1 IMPLANT
GAUZE PACKING IODOFORM 1/2INX (GAUZE/BANDAGES/DRESSINGS) IMPLANT
GAUZE PAD ABD 8X10 STRL (GAUZE/BANDAGES/DRESSINGS) ×1 IMPLANT
GAUZE SPONGE 4X4 12PLY STRL (GAUZE/BANDAGES/DRESSINGS) ×1 IMPLANT
GLOVE BIO SURGEON STRL SZ7.5 (GLOVE) ×1 IMPLANT
GLOVE BIO SURGEON STRL SZ8 (GLOVE) ×1 IMPLANT
GLOVE BIOGEL PI IND STRL 8 (GLOVE) ×1 IMPLANT
GLOVE INDICATOR 8.0 STRL GRN (GLOVE) ×1 IMPLANT
GOWN STRL REUS W/ TWL LRG LVL3 (GOWN DISPOSABLE) ×2 IMPLANT
GOWN STRL REUS W/ TWL XL LVL3 (GOWN DISPOSABLE) ×1 IMPLANT
GOWN STRL REUS W/TWL LRG LVL3 (GOWN DISPOSABLE) ×2
GOWN STRL REUS W/TWL XL LVL3 (GOWN DISPOSABLE) ×1
KIT BASIN OR (CUSTOM PROCEDURE TRAY) ×1 IMPLANT
KIT SIGMOIDOSCOPE (SET/KITS/TRAYS/PACK) IMPLANT
KIT TURNOVER KIT B (KITS) ×1 IMPLANT
NDL 18GX1X1/2 (RX/OR ONLY) (NEEDLE) IMPLANT
NDL HYPO 25GX1X1/2 BEV (NEEDLE) ×1 IMPLANT
NEEDLE 18GX1X1/2 (RX/OR ONLY) (NEEDLE) IMPLANT
NEEDLE HYPO 25GX1X1/2 BEV (NEEDLE) ×1 IMPLANT
NS IRRIG 1000ML POUR BTL (IV SOLUTION) ×1 IMPLANT
PACK LITHOTOMY IV (CUSTOM PROCEDURE TRAY) ×1 IMPLANT
PAD ARMBOARD 7.5X6 YLW CONV (MISCELLANEOUS) ×1 IMPLANT
PENCIL SMOKE EVACUATOR (MISCELLANEOUS) ×1 IMPLANT
SPECIMEN JAR SMALL (MISCELLANEOUS) IMPLANT
SPIKE FLUID TRANSFER (MISCELLANEOUS) ×1 IMPLANT
SPONGE SURGIFOAM ABS GEL 100 (HEMOSTASIS) IMPLANT
SPONGE T-LAP 18X18 ~~LOC~~+RFID (SPONGE) ×1 IMPLANT
SURGILUBE 2OZ TUBE FLIPTOP (MISCELLANEOUS) ×1 IMPLANT
SUT CHROMIC 2 0 SH (SUTURE) IMPLANT
SUT MON AB 3-0 SH 27 (SUTURE)
SUT MON AB 3-0 SH27 (SUTURE) IMPLANT
SUT SILK 0 SH 30 (SUTURE) IMPLANT
SWAB COLLECTION DEVICE MRSA (MISCELLANEOUS) IMPLANT
SWAB CULTURE ESWAB REG 1ML (MISCELLANEOUS) IMPLANT
SYR CONTROL 10ML LL (SYRINGE) ×1 IMPLANT
TOWEL GREEN STERILE (TOWEL DISPOSABLE) ×1 IMPLANT
TOWEL GREEN STERILE FF (TOWEL DISPOSABLE) ×1 IMPLANT
TUBE CONNECTING 12X1/4 (SUCTIONS) ×1 IMPLANT
UNDERPAD 30X36 HEAVY ABSORB (UNDERPADS AND DIAPERS) ×1 IMPLANT
YANKAUER SUCT BULB TIP NO VENT (SUCTIONS) ×1 IMPLANT

## 2022-10-20 NOTE — ED Provider Notes (Addendum)
[place] EMERGENCY DEPARTMENT AT [facility] Provider Note   CSN: [id number] Arrival date & time: 10/20/22  6644     History  Chief Complaint  Patient presents with   Abscess    Amber Blanchard is a 32 y.o. female.  HPI    32 year old female with history of Crohn's disease comes in with chief complaint of abscess.  Patient has history of recurrent gluteal abscess.  She also has history of strictures as a complication of her Crohn's disease.  Patient currently on MiraLAX and infliximab for her Crohn's.  Typically the abscess will come to head with conservative measures.  However this current abscess has been now present for at least a week.  She received her Crohn's shot recently, and suspects that it might have prevented it from getting to head.  Review of system is negative for fevers, chills.    Home Medications Prior to Admission medications   Medication Sig Start Date End Date Taking? Authorizing Provider  amLODipine (NORVASC) 5 MG tablet Take 5 mg by mouth at bedtime. 08/26/22  Yes [provider]  ciprofloxacin (CIPRO) 500 MG tablet Take 1 tablet (500 mg total) by mouth 2 (two) times daily for 5 days. 10/21/22 10/26/22 Yes Simaan, Francine Graven, PA-C  cyanocobalamin (VITAMIN B12) 1000 MCG/ML injection Inject 1,000 mcg into the muscle every 30 (thirty) days. 11/19/20  Yes [provider]  fluconazole (DIFLUCAN) 100 MG tablet Take 1 tablet (100 mg total) by mouth once for 1 dose. 10/21/22 10/21/22 Yes Simaan, Francine Graven, PA-C  inFLIXimab-dyyb (INFLECTRA IV) Inject 5 mg into the vein every 6 (six) weeks. Taken at [employer] infusion   Yes [provider]  metroNIDAZOLE (FLAGYL) 500 MG tablet Take 1 tablet (500 mg total) by mouth 2 (two) times daily with a meal for 5 days. DO NOT CONSUME ALCOHOL WHILE TAKING THIS MEDICATION. 10/21/22 10/26/22 Yes Simaan, Francine Graven, PA-C  valsartan-hydrochlorothiazide (DIOVAN-HCT) 80-12.5 MG tablet Take 1 tablet by mouth  every morning. 08/26/22  Yes [provider]  acetaminophen (TYLENOL) 325 MG tablet Take 2 tablets (650 mg total) by mouth every 6 (six) hours as needed for mild pain (or temp > 100). 10/21/22   Simaan, Francine Graven, PA-C  oxyCODONE (OXY IR/ROXICODONE) 5 MG immediate release tablet Take 1 tablet (5 mg total) by mouth every 4 (four) hours as needed for moderate pain or severe pain (not releived by tylenol). 10/21/22   Adam Phenix, PA-C      Allergies    Patient has no known allergies.    Review of Systems   Review of Systems  All other systems reviewed and are negative.   Physical Exam Updated Vital Signs BP 118/63 (BP Location: Right Arm)   Pulse 63   Temp 98.1 F (36.7 C) (Oral)   Resp 16   Ht 5\' 3"  (1.6 m)   Wt 110 kg   LMP 10/09/2022 (Approximate)   SpO2 99%   BMI 42.96 kg/m  Physical Exam Vitals and nursing note reviewed. Exam conducted with a chaperone present.  Constitutional:      Appearance: She is well-developed.  HENT:     Head: Atraumatic.  Cardiovascular:     Rate and Rhythm: Normal rate.  Pulmonary:     Effort: Pulmonary effort is normal.  Genitourinary:    Comments: Patient has a large gluteal wall abscess over the left side Musculoskeletal:     Cervical back: Normal range of motion and neck supple.  Skin:  General: Skin is warm and dry.  Neurological:     Mental Status: She is alert and oriented to person, place, and time.     ED Results / Procedures / Treatments   Labs (all labs ordered are listed, but only abnormal results are displayed) Labs Reviewed  CBC - Abnormal; Notable for the following components:      Result Value   WBC 12.5 (*)    Hemoglobin 11.4 (*)    HCT 33.9 (*)    All other components within normal limits  BASIC METABOLIC PANEL - Abnormal; Notable for the following components:   Sodium 134 (*)    Potassium 3.3 (*)    Glucose, Bld 103 (*)    All other components within normal limits  HCG, QUANTITATIVE,  PREGNANCY    EKG None  Radiology CT PELVIS W CONTRAST  Result Date: 10/20/2022 CLINICAL DATA:  Perianal abscess. EXAM: CT PELVIS WITH CONTRAST TECHNIQUE: Multidetector CT imaging of the pelvis was performed using the standard protocol following the bolus administration of intravenous contrast. RADIATION DOSE REDUCTION: This exam was performed according to the departmental dose-optimization program which includes automated exposure control, adjustment of the mA and/or kV according to patient size and/or use of iterative reconstruction technique. CONTRAST:  OMNIPAQUE IOHEXOL 300 MG/ML  SOLN COMPARISON:  CT January 2015 FINDINGS: Urinary Tract: Bladder is underdistended but has a preserved contour. Bowel: In the pelvis the rectosigmoid colon has a normal course and caliber. Visualized small bowel is preserved. There is a normal caliber appendix. Vascular/Lymphatic: Preserved iliac vessels. There are some enlarged right inguinal lymph nodes. Example series 2, image 34 measures 20 x 13 mm. Few small but prominent perirectal nodes are identified. No additional abnormal lymph node enlargement seen in the visualized pelvis. Reproductive:  Uterus is present.  No separate adnexal mass. Other: There is a rim enhancing fluid collection identified along the subcutaneous fat along the right gluteal cleft with significant stranding. This curvilinear collection has a estimated dimensions approaching 7.8 x 2.5 cm in the axial plane and cephalocaudal approaching 5.3 cm in the coronal plane. This extends along the gluteal cleft and has a tract extending to the anal region near midline such as axial series 2, image 44 and coronal series 4 image 32. This could represent anal fistula was abscess formation. No soft tissue gas. No further extension along the ischioanal fossa or above the pelvic floor musculature. If needed dedicated workup with anal fistula MRI could be considered as clinically appropriate Musculoskeletal:  No suspicious bone lesions identified. IMPRESSION: Complex fluid collection with stranding extending along the right gluteal cleft with possible fistula tract to the posterior aspect of the anal canal. Please correlate for infection and abscess. Enlarged, possibly reactive lymph nodes in the right inguinal region. This also can be assessed on follow-up Electronically Signed   By: Karen Kays M.D.   On: 10/20/2022 11:04    Procedures Procedures    Medications Ordered in ED Medications  enoxaparin (LOVENOX) injection 40 mg (40 mg Subcutaneous Given 10/21/22 0106)  0.9 %  sodium chloride infusion (0 mLs Intravenous Stopped 10/21/22 0945)  ciprofloxacin (CIPRO) IVPB 400 mg (400 mg Intravenous New Bag/Given 10/21/22 0328)  metroNIDAZOLE (FLAGYL) IVPB 500 mg (500 mg Intravenous New Bag/Given 10/21/22 0454)  acetaminophen (TYLENOL) tablet 650 mg ( Oral MAR Unhold 10/20/22 1854)    Or  acetaminophen (TYLENOL) suppository 650 mg ( Rectal MAR Unhold 10/20/22 1854)  oxyCODONE (Oxy IR/ROXICODONE) immediate release tablet 5-10 mg (5 mg  Oral Given 10/21/22 0938)  morphine (PF) 2 MG/ML injection 2 mg ( Intravenous MAR Unhold 10/20/22 1854)  ondansetron (ZOFRAN-ODT) disintegrating tablet 4 mg ( Oral MAR Unhold 10/20/22 1854)    Or  ondansetron (ZOFRAN) injection 4 mg ( Intravenous MAR Unhold 10/20/22 1854)  HYDROmorphone (DILAUDID) injection 0.5 mg (0.5 mg Intravenous Given 10/21/22 0459)  amisulpride (BARHEMSYS) 5 MG/2ML injection (has no administration in time range)  fentaNYL (SUBLIMAZE) 100 MCG/2ML injection (has no administration in time range)  morphine (PF) 4 MG/ML injection 4 mg (4 mg Intravenous Given 10/20/22 0928)  iohexol (OMNIPAQUE) 300 MG/ML solution 100 mL (100 mLs Intravenous Contrast Given 10/20/22 1016)  morphine (PF) 4 MG/ML injection 6 mg (6 mg Intravenous Given 10/20/22 1114)  acetaminophen (TYLENOL) tablet 1,000 mg (1,000 mg Oral Given 10/20/22 1640)  amisulpride (BARHEMSYS) injection 10 mg (10 mg  Intravenous Given 10/20/22 1813)  chlorhexidine (PERIDEX) 0.12 % solution 15 mL (15 mLs Mouth/Throat Given 10/20/22 1647)    Or  Oral care mouth rinse ( Mouth Rinse See Alternative 10/20/22 1647)    ED Course/ Medical Decision Making/ A&P                             Medical Decision Making Amount and/or Complexity of Data Reviewed Labs: ordered. Radiology: ordered.  Risk Prescription drug management. Decision regarding hospitalization.   32 year old female with history of Crohn's on immunomodulators comes in with chief complaint of recurrent abscess.  She indicates that this current abscess in her gluteal wall area is not improving despite conservative approach.  Differential diagnosis for the patient includes gluteal abscess, rectal fistula, stricture related complication.  Will get CT pelvis with contrast and reassess.  3:55 PM CT scan confirms gluteal abscess.  However there is also tracking appreciated towards the anus.  Patient continues to deny any rectal pain with defecation.  Given the complexity noted on CT scan, I have consulted Dr. Dwain Sarna.  I discussed the CT reading and finding with him.  He is requesting that patient be transferred to [facility] where patient will need bedside evaluation before treatment plan can be initiated.  We will transfer her to [facility], ED.  Dr. Lynelle Doctor excepting at [facility].  Final Clinical Impression(s) / ED Diagnoses Final diagnoses:  Abscess, gluteal, right  Anal fistula    Rx / DC Orders ED Discharge Orders          Ordered    acetaminophen (TYLENOL) 325 MG tablet  Every 6 hours PRN        10/21/22 0946    oxyCODONE (OXY IR/ROXICODONE) 5 MG immediate release tablet  Every 4 hours PRN        10/21/22 0946    ciprofloxacin (CIPRO) 500 MG tablet  2 times daily        10/21/22 0946    metroNIDAZOLE (FLAGYL) 500 MG tablet  2 times daily with meals        10/21/22 0946    fluconazole (DIFLUCAN) 100 MG tablet   Once         10/21/22 0946              Derwood Kaplan, MD 10/20/22 1240    Derwood Kaplan, MD 10/21/22 1555

## 2022-10-20 NOTE — Op Note (Signed)
10/20/2022  6:00 PM  PATIENT:  Amber Blanchard  32 y.o. female  Patient Care Team: Patient, No Pcp Per as PCP - General (General Practice)  PRE-OPERATIVE DIAGNOSIS:  Perirectal abscess, history of Crohn's disease  POST-OPERATIVE DIAGNOSIS:  Same  PROCEDURE:   Incision and drainage of perirectal abscess with placement of Penrose drain Anorectal exam under anesthesia  SURGEON:  Surgeon(s): Andria Meuse, MD  ASSISTANT: OR staff   ANESTHESIA:   general  SPECIMEN:  No Specimen  DISPOSITION OF SPECIMEN:  N/A  COUNTS:  Sponge, needle, and instrument counts were reported correct x2 at conclusion.  EBL: 10 mL  PLAN OF CARE: Admit to inpatient   PATIENT DISPOSITION:  PACU - hemodynamically stable.  OR FINDINGS: Mild intrinsic anal stenosis, accommodates a lubricated digit and the smallest anoscope.  Waxy elephant tags in the perianal area.  Significant thickening of perianal skin and fibrosis.  Findings appear clinically consistent with perianal Crohn's.  Right perirectal abscess extending to the ischial rectal fossa and gluteal subcutaneous tissue.  This was already spontaneously draining to some degree.  This was opened further to facilitate drainage.  The abscess cavity extends laterally at least 5 cm and was therefore managed with a counterincision and placement of a Penrose drain.  No identifiable anal fistula given the size of the abscess cavity and acute swelling.  DESCRIPTION: The patient was identified in the preoperative holding area and taken to the OR where s was placed on the operating room table. SCDs were placed.  General anesthesia was induced without difficulty. The patient was then positioned in high lithotomy with Allen stirrups. Pressure points were then evaluated and padded.  She was then prepped and draped in usual sterile fashion.  A surgical timeout was performed indicating the correct patient, procedure, and positioning.  A perianal block was performed  using a dilute mixture of 0.25% Marcaine with epinephrine and Exparel.   After ascertaining that an appropriate level of anesthesia had been achieved, a well lubricated digital rectal exam was performed. This demonstrated mild intrinsic stenosis but accommodates a lubricated digit.  No palpable masses.  Fibrosis and scarring within the anal canal.  A Hill-Ferguson anoscope was into the anal canal and circumferential inspection demonstrated scarring and some granulation type tissue within the anal canal predominantly in the posterior portion.  Waxy skin tags externally.  All clinically appears consistent with perineal Crohn's.  On the right side extending into the ischial anal as well as gluteal fat there is an evident abscess cavity.  This is already spontaneously draining to some degree.  There is a chronic appearing opening.  This is widened.  Approximately 5 cc of purulent fluid is drained.  There is a pocket tracking lateral to this there were able to break into.  There are no other palpable areas of fluctuance or pockets identified on exam.  The chronic abscess cavity is irrigated.  Hemostasis is verified.  Given the size of this, we opted to place 1/4 inch Penrose drain through a counterincision.  There is no obvious internal opening to an anal fistula within the anal canal.  Size the abscess cavity precludes any clearly identifiable tracts from being detected.  To assist in hemostasis, a moist Kerlix gauze is packed into the abscess cavity.  All sponge, needle, and instrument counts were reported correct.  A dressing consisting of 4 x 4's, ABD, and mesh underwear was ultimately placed.  She was taken out of the lithotomy position, awakened from anesthesia, extubated,  transferred to a stretcher for transport to recovery in satisfactory condition.  DISPOSITION: PACU in satisfactory condition.

## 2022-10-20 NOTE — Consult Note (Signed)
Amber Blanchard 07-03-1990  [id number].    Requesting MD: Dr. Derwood Kaplan Chief Complaint/Reason for Consult: Hx of Crohn's  HPI: Amber Blanchard is a 32 y.o. female with a hx of Crohn's with recurrent perianal abscess and colonic stricture on daily Miralax bowel regimen followed by Dr. Barron Alvine on infliximab (last infusion 7/19) who presented to the [facility] ED with R buttock abscess x 1 week. She reports hx of similar in the past that usually spontaneously drain with sitz baths. This abscess however has not drained and has become increasingly painful prompting her visit to the ED. Since presented to the ED it has started draining bloody purulent fluid. Some associated nausea. No fever or vomiting.   In the ED she was afebrile and without hypotension. Noted tachycardia that resolved after IVF. WBC 12.5 (normal 7/16). CT pelvis with Complex fluid collection measuring 7.8 x 2.5cm with stranding extending along the right gluteal cleft with possible fistula tract to the posterior aspect of the anal canal.  General surgery was consulted and patient was transferred from [facility] to [facility] for evaluation.   Last Colonoscopy (04/13/22): Scarring from "prior perianal fistulous tracts" as well as a benign appearing stenosis in proximal ascending colon that was able to be traversed using an ultraslim colonoscope - bx with mild active crohn's colitis  Prior Abdominal Surgeries: C-Section, tubal ligation Blood Thinners: None Last PO intake: yesterday.  Allergies: NKDA Tobacco Use: None Alcohol Use: Socially  Substance use: None Employment: [employer], C[employer]  ROS: ROS As above, see hpi  Family History  Problem Relation Age of Onset   Healthy Mother    Hypertension Father    Healthy Sister    Healthy Brother    Colon cancer Neg Hx    Esophageal cancer Neg Hx    Rectal cancer Neg Hx    Stomach cancer Neg Hx     Past Medical History:  Diagnosis Date   Anal stricture    Anemia    Cholelithiasis     External hemorrhoids    Genital condyloma, female    HPV in female    Hypertension    Phreesia 03/11/2020    Past Surgical History:  Procedure Laterality Date   CESAREAN SECTION  10/21/2010   Procedure: CESAREAN SECTION;  Surgeon: Jessee Avers;  Location: [facility] ORS;  Service: Gynecology;  Laterality: N/A;   CESAREAN SECTION WITH BILATERAL TUBAL LIGATION Bilateral 02/20/2015   Procedure: CESAREAN SECTION WITH BILATERAL TUBAL LIGATION;  Surgeon: Adam Phenix, MD;  Location: [facility] ORS;  Service: Obstetrics;  Laterality: Bilateral;    Social History:  reports that she has never smoked. She has never used smokeless tobacco. She reports current alcohol use of about 5.0 standard drinks of alcohol per week. She reports that she does not currently use drugs after having used the following drugs: Marijuana. Frequency: 7.00 times per week.  Allergies: No Known Allergies  (Not in a hospital admission)    Physical Exam: Blood pressure 135/78, pulse 96, temperature 98.6 F (37 C), temperature source Oral, resp. rate 17, height 5\' 3"  (1.6 m), weight 110 kg, last menstrual period 10/09/2022, SpO2 99%. Gen:  Alert, NAD, pleasant Card:  RRR Pulm:  CTAB, no W/R/R, effort normal Abd: Soft, NT/ND, +BS GU: Chaperone, RN present. Patient with R perianal abscess with ~5x7cm area of fluctuance tracking towards the anus with small opening medially with bloody purulent drainage. Overlying erythema and heat. Very ttp. Rectal exam deferred 2/2 pain.  Psych:  A&Ox3  Skin: no rashes noted, warm and dry   Results for orders placed or performed during the hospital encounter of 10/20/22 (from the past 48 hour(s))  CBC     Status: Abnormal   Collection Time: 10/20/22  8:40 AM  Result Value Ref Range   WBC 12.5 (H) 4.0 - 10.5 K/uL   RBC 4.00 3.87 - 5.11 MIL/uL   Hemoglobin 11.4 (L) 12.0 - 15.0 g/dL   HCT 09.8 (L) 11.9 - 14.7 %   MCV 84.8 80.0 - 100.0 fL   MCH 28.5 26.0 - 34.0 pg   MCHC 33.6 30.0 - 36.0 g/dL    RDW 82.9 56.2 - 13.0 %   Platelets 342 150 - 400 K/uL   nRBC 0.0 0.0 - 0.2 %    Comment: Performed at [facility], [address], [place], [place] [address]  Basic metabolic panel     Status: Abnormal   Collection Time: 10/20/22  8:40 AM  Result Value Ref Range   Sodium 134 (L) 135 - 145 mmol/L   Potassium 3.3 (L) 3.5 - 5.1 mmol/L   Chloride 101 98 - 111 mmol/L   CO2 26 22 - 32 mmol/L   Glucose, Bld 103 (H) 70 - 99 mg/dL    Comment: Glucose reference range applies only to samples taken after fasting for at least 8 hours.   BUN 12 6 - 20 mg/dL   Creatinine, Ser 4.69 0.44 - 1.00 mg/dL   Calcium 8.9 8.9 - 62.9 mg/dL   GFR, Estimated >52 >84 mL/min    Comment: (NOTE) Calculated using the CKD-EPI Creatinine Equation (2021)    Anion gap 7 5 - 15    Comment: Performed at [facility], [address] [place], [place] [address]   CT PELVIS W CONTRAST  Result Date: 10/20/2022 CLINICAL DATA:  Perianal abscess. EXAM: CT PELVIS WITH CONTRAST TECHNIQUE: Multidetector CT imaging of the pelvis was performed using the standard protocol following the bolus administration of intravenous contrast. RADIATION DOSE REDUCTION: This exam was performed according to the departmental dose-optimization program which includes automated exposure control, adjustment of the mA and/or kV according to patient size and/or use of iterative reconstruction technique. CONTRAST:  OMNIPAQUE IOHEXOL 300 MG/ML  SOLN COMPARISON:  CT January 2015 FINDINGS: Urinary Tract: Bladder is underdistended but has a preserved contour. Bowel: In the pelvis the rectosigmoid colon has a normal course and caliber. Visualized small bowel is preserved. There is a normal caliber appendix. Vascular/Lymphatic: Preserved iliac vessels. There are some enlarged right inguinal lymph nodes. Example series 2, image 34 measures 20 x 13 mm. Few small but prominent perirectal nodes are identified. No additional  abnormal lymph node enlargement seen in the visualized pelvis. Reproductive:  Uterus is present.  No separate adnexal mass. Other: There is a rim enhancing fluid collection identified along the subcutaneous fat along the right gluteal cleft with significant stranding. This curvilinear collection has a estimated dimensions approaching 7.8 x 2.5 cm in the axial plane and cephalocaudal approaching 5.3 cm in the coronal plane. This extends along the gluteal cleft and has a tract extending to the anal region near midline such as axial series 2, image 44 and coronal series 4 image 32. This could represent anal fistula was abscess formation. No soft tissue gas. No further extension along the ischioanal fossa or above the pelvic floor musculature. If needed dedicated workup with anal fistula MRI could be considered as clinically appropriate Musculoskeletal: No suspicious bone lesions identified.  IMPRESSION: Complex fluid collection with stranding extending along the right gluteal cleft with possible fistula tract to the posterior aspect of the anal canal. Please correlate for infection and abscess. Enlarged, possibly reactive lymph nodes in the right inguinal region. This also can be assessed on follow-up Electronically Signed   By: Karen Kays M.D.   On: 10/20/2022 11:04    Anti-infectives (From admission, onward)    None       Assessment/Plan Hx of Crohn's with R perianal abscess  Patient does not feel she would be able to tolerate bedside procedure. I think she would benefit from EUA and drainage of R perianal abscess in the OR tonight. We discussed indications, planned procedure and associated risks. Patient in agreement to proceed. Start abx. Keep NPO. Admit to observation.   FEN - NPO, IVF VTE - SCDs ID - Zosyn Dispo - Admit to observation  I reviewed nursing notes, ED provider notes, last 24 h vitals and pain scores, last 48 h intake and output, last 24 h labs and trends, and last 24 h imaging  results  Jacinto Halim, [facility] 10/20/2022, 1:03 PM Please see Amion for pager number during day hours 7:00am-4:30pm

## 2022-10-20 NOTE — Transfer of Care (Signed)
Immediate Anesthesia Transfer of Care Note  Patient: Amber Blanchard  Procedure(s) Performed: EXAM UNDER ANESTHESIA (Rectum) IRRIGATION AND DEBRIDEMENT PERIRECTAL ABSCESS (Rectum)  Patient Location: PACU  Anesthesia Type:General  Level of Consciousness: awake, oriented, drowsy, patient cooperative, and responds to stimulation  Airway & Oxygen Therapy: Patient Spontanous Breathing and Patient connected to face mask oxygen  Post-op Assessment: Report given to RN, Post -op Vital signs reviewed and stable, Patient moving all extremities X 4, and Patient able to stick tongue midline  Post vital signs: Reviewed and stable  Last Vitals:  Vitals Value Taken Time  BP 112/63 10/20/22 1807  Temp 98.7   Pulse 92 10/20/22 1810  Resp 21 10/20/22 1810  SpO2 92 % 10/20/22 1810  Vitals shown include unfiled device data.  Last Pain:  Vitals:   10/20/22 1204  TempSrc: Oral  PainSc:          Complications: No notable events documented.

## 2022-10-20 NOTE — Anesthesia Preprocedure Evaluation (Signed)
Anesthesia Evaluation  Patient identified by MRN, date of birth, ID band Patient awake    Reviewed: Allergy & Precautions, NPO status , Patient's Chart, lab work & pertinent test results  Airway Mallampati: II  TM Distance: >3 FB Neck ROM: Full    Dental  (+) Dental Advisory Given   Pulmonary Current Smoker   breath sounds clear to auscultation       Cardiovascular hypertension, Pt. on medications  Rhythm:Regular Rate:Normal     Neuro/Psych negative neurological ROS     GI/Hepatic negative GI ROS, Neg liver ROS,,,  Endo/Other    Morbid obesity  Renal/GU negative Renal ROS     Musculoskeletal   Abdominal   Peds  Hematology  (+) Blood dyscrasia, anemia   Anesthesia Other Findings   Reproductive/Obstetrics                             Anesthesia Physical Anesthesia Plan  ASA: 3  Anesthesia Plan: General   Post-op Pain Management: Tylenol PO (pre-op)* and Toradol IV (intra-op)*   Induction: Intravenous  PONV Risk Score and Plan: 2 and Dexamethasone, Ondansetron, Midazolam and Treatment may vary due to age or medical condition  Airway Management Planned: Oral ETT  Additional Equipment: None  Intra-op Plan:   Post-operative Plan: Extubation in OR  Informed Consent: I have reviewed the patients History and Physical, chart, labs and discussed the procedure including the risks, benefits and alternatives for the proposed anesthesia with the patient or authorized representative who has indicated his/her understanding and acceptance.     Dental advisory given  Plan Discussed with: CRNA  Anesthesia Plan Comments:        Anesthesia Quick Evaluation

## 2022-10-20 NOTE — Progress Notes (Signed)
Seen and evaluated as well.  Underlying Crohn's on infliximab, known/suspected perianal fistula but hasn't had abscess before. Now with apparent perirectal abscess.   -The anatomy and physiology of the anal canal was discussed with the patient. The pathophysiology of anal abscess and fistula was discussed as well, working to provide a good understanding. -We discussed proceeding with incision/drainage of perirectal abscess, anorectal exam under anesthesia, possible draining seton if clear fistula identified -Discussed expectations with potential penrose drains and setons if placed -The planned procedures, material risks (including, but not limited to, pain, bleeding, infection, scarring, need for blood transfusion, damage to anal sphincter, incontinence of gas and/or stool, need for additional procedures, recurrence, pneumonia, heart attack, stroke, death) benefits and alternatives to surgery were discussed.  The patient's questions were answered to her satisfaction, she voiced understanding and elected to proceed with surgery. Additionally, we discussed typical postoperative expectations and the recovery process.  Marin Olp, MD [facility], A [facility]

## 2022-10-20 NOTE — Plan of Care (Signed)
Patient alert/oriented X4. Patient VSS upon admission and complained of a headache. Tylenol administered as needed. Patient skin assessed with Gilman Buttner, RN. No skin issues noted expect some erythema to buttocks. Patient belongings at bedside and consent is signed for drainage of perianal abscess.

## 2022-10-20 NOTE — ED Provider Notes (Signed)
Patient transferred [facility] to get seen by general surgery.  Patient resting and is awaiting general surgery evaluation.  Will await their recommendations for disposition.   Jeroline Wolbert, Canary Brim, MD 10/20/22 1550

## 2022-10-20 NOTE — ED Triage Notes (Signed)
Pt BIB [facility] from [facility] due to rectal pain.  Pt has hx of anal fistula and crohn's.  Pt does have abscess that needs a general consult.  Pt reports "leaking"

## 2022-10-20 NOTE — ED Notes (Signed)
ED TO INPATIENT HANDOFF REPORT  ED Nurse Name and Phone #: Marchelle Folks RN [phone]  S Name/Age/Gender Amber Blanchard 32 y.o. female Room/Bed: H012C/H012C  Code Status   Code Status: Full Code  Home/SNF/Other Home Patient oriented to: self, place, time, and situation Is this baseline? Yes   Triage Complete: Triage complete  Chief Complaint Perianal Crohn's disease, with abscess (HCC) [K50.114]  Triage Note Pt endorses abscess to buttocks for a week with hx of fistula. Minimal drainage. Pt reports infusions to help with this and last one was Friday.  Pt [facility] due to rectal pain.  Pt has hx of anal fistula and crohn's.  Pt does have abscess that needs a general consult.  Pt reports "leaking"   Allergies No Known Allergies  Level of Care/Admitting Diagnosis ED Disposition     ED Disposition  Admit   Condition  --   Comment  Hospital Area: [facility] [[id number]]  Level of Care: Med-Surg [16]  May place patient in observation at [facility] or [facility] if equivalent level of care is available:: No  Covid Evaluation: Asymptomatic - no recent exposure (last 10 days) testing not required  Diagnosis: Perianal Crohn's disease, with abscess [facility]) [[id number]]  Admitting Physician: CCS, MD [[id number]]  Attending Physician: CCS, MD [[id number]]          B Medical/Surgery History Past Medical History:  Diagnosis Date   Anal stricture    Anemia    Cholelithiasis    External hemorrhoids    Genital condyloma, female    HPV in female    Hypertension    Phreesia 03/11/2020   Past Surgical History:  Procedure Laterality Date   CESAREAN SECTION  10/21/2010   Procedure: CESAREAN SECTION;  Surgeon: Jessee Avers;  Location: [facility];  Service: Gynecology;  Laterality: N/A;   CESAREAN SECTION WITH BILATERAL TUBAL LIGATION Bilateral 02/20/2015   Procedure: CESAREAN SECTION WITH BILATERAL TUBAL LIGATION;  Surgeon: Adam Phenix, MD;  Location: [facility];   Service: Obstetrics;  Laterality: Bilateral;     A IV Location/Drains/Wounds Patient Lines/Drains/Airways Status     Active Line/Drains/Airways     Name Placement date Placement time Site Days   Peripheral IV 10/20/22 20 G Anterior;Left;Proximal Forearm 10/20/22  0840  Forearm  less than 1   Incision (Closed) 02/20/15 Abdomen Other (Comment) 02/20/15  0529  -- 2799            Intake/Output Last 24 hours No intake or output data in the 24 hours ending 10/20/22 1515  Labs/Imaging Results for orders placed or performed during the hospital encounter of 10/20/22 (from the past 48 hour(s))  CBC     Status: Abnormal   Collection Time: 10/20/22  8:40 AM  Result Value Ref Range   WBC 12.5 (H) 4.0 - 10.5 K/uL   RBC 4.00 3.87 - 5.11 MIL/uL   Hemoglobin 11.4 (L) 12.0 - 15.0 g/dL   HCT 57.8 (L) 46.9 - 62.9 %   MCV 84.8 80.0 - 100.0 fL   MCH 28.5 26.0 - 34.0 pg   MCHC 33.6 30.0 - 36.0 g/dL   RDW 52.8 41.3 - 24.4 %   Platelets 342 150 - 400 K/uL   nRBC 0.0 0.0 - 0.2 %    Comment: Performed at [facility], [address] [place], [place] [address]  Basic metabolic panel     Status: Abnormal   Collection Time: 10/20/22  8:40 AM  Result Value Ref  Range   Sodium 134 (L) 135 - 145 mmol/L   Potassium 3.3 (L) 3.5 - 5.1 mmol/L   Chloride 101 98 - 111 mmol/L   CO2 26 22 - 32 mmol/L   Glucose, Bld 103 (H) 70 - 99 mg/dL    Comment: Glucose reference range applies only to samples taken after fasting for at least 8 hours.   BUN 12 6 - 20 mg/dL   Creatinine, Ser 4.03 0.44 - 1.00 mg/dL   Calcium 8.9 8.9 - 47.4 mg/dL   GFR, Estimated >25 >95 mL/min    Comment: (NOTE) Calculated using the CKD-EPI Creatinine Equation (2021)    Anion gap 7 5 - 15    Comment: Performed at [facility], [address] [place], [place] [address]   CT PELVIS W CONTRAST  Result Date: 10/20/2022 CLINICAL DATA:  Perianal abscess. EXAM: CT PELVIS WITH CONTRAST TECHNIQUE:  Multidetector CT imaging of the pelvis was performed using the standard protocol following the bolus administration of intravenous contrast. RADIATION DOSE REDUCTION: This exam was performed according to the departmental dose-optimization program which includes automated exposure control, adjustment of the mA and/or kV according to patient size and/or use of iterative reconstruction technique. CONTRAST:  OMNIPAQUE IOHEXOL 300 MG/ML  SOLN COMPARISON:  CT January 2015 FINDINGS: Urinary Tract: Bladder is underdistended but has a preserved contour. Bowel: In the pelvis the rectosigmoid colon has a normal course and caliber. Visualized small bowel is preserved. There is a normal caliber appendix. Vascular/Lymphatic: Preserved iliac vessels. There are some enlarged right inguinal lymph nodes. Example series 2, image 34 measures 20 x 13 mm. Few small but prominent perirectal nodes are identified. No additional abnormal lymph node enlargement seen in the visualized pelvis. Reproductive:  Uterus is present.  No separate adnexal mass. Other: There is a rim enhancing fluid collection identified along the subcutaneous fat along the right gluteal cleft with significant stranding. This curvilinear collection has a estimated dimensions approaching 7.8 x 2.5 cm in the axial plane and cephalocaudal approaching 5.3 cm in the coronal plane. This extends along the gluteal cleft and has a tract extending to the anal region near midline such as axial series 2, image 44 and coronal series 4 image 32. This could represent anal fistula was abscess formation. No soft tissue gas. No further extension along the ischioanal fossa or above the pelvic floor musculature. If needed dedicated workup with anal fistula MRI could be considered as clinically appropriate Musculoskeletal: No suspicious bone lesions identified. IMPRESSION: Complex fluid collection with stranding extending along the right gluteal cleft with possible fistula tract to  the posterior aspect of the anal canal. Please correlate for infection and abscess. Enlarged, possibly reactive lymph nodes in the right inguinal region. This also can be assessed on follow-up Electronically Signed   By: Karen Kays M.D.   On: 10/20/2022 11:04    Pending Labs Unresulted Labs (From admission, onward)     Start     Ordered   10/27/22 0500  Creatinine, serum  (enoxaparin (LOVENOX)    CrCl >/= 30 ml/min)  Weekly,   R     Comments: while on enoxaparin therapy    10/20/22 1510   10/20/22 0926  Pregnancy, urine  Once,   URGENT        10/20/22 0925            Vitals/Pain Today's Vitals   10/20/22 0900 10/20/22 0930 10/20/22 1054 10/20/22 1204  BP: (!) 144/80 135/88  135/78  Pulse: (!) 105 89  96  Resp:  18  17  Temp:    98.6 F (37 C)  TempSrc:    Oral  SpO2: 98% 100%  99%  Weight:      Height:      PainSc:   10-Worst pain ever     Isolation Precautions No active isolations  Medications Medications  enoxaparin (LOVENOX) injection 40 mg (has no administration in time range)  0.9 %  sodium chloride infusion (has no administration in time range)  ciprofloxacin (CIPRO) IVPB 400 mg (has no administration in time range)  metroNIDAZOLE (FLAGYL) IVPB 500 mg (has no administration in time range)  acetaminophen (TYLENOL) tablet 650 mg (has no administration in time range)    Or  acetaminophen (TYLENOL) suppository 650 mg (has no administration in time range)  oxyCODONE (Oxy IR/ROXICODONE) immediate release tablet 5-10 mg (has no administration in time range)  morphine (PF) 2 MG/ML injection 2 mg (has no administration in time range)  ondansetron (ZOFRAN-ODT) disintegrating tablet 4 mg (has no administration in time range)    Or  ondansetron (ZOFRAN) injection 4 mg (has no administration in time range)  morphine (PF) 4 MG/ML injection 4 mg (4 mg Intravenous Given 10/20/22 0928)  iohexol (OMNIPAQUE) 300 MG/ML solution 100 mL (100 mLs Intravenous Contrast Given 10/20/22  1016)  morphine (PF) 4 MG/ML injection 6 mg (6 mg Intravenous Given 10/20/22 1114)    Mobility walks     Focused Assessments Pt is here for perirectal abscess     R Recommendations: See Admitting Provider Note  Report given to:   Additional Notes:  pt reports being very embarrassed that she is in the hall.

## 2022-10-20 NOTE — Anesthesia Procedure Notes (Signed)
Procedure Name: Intubation Date/Time: 10/20/2022 5:30 PM  Performed by: Aundria Rud, CRNAPre-anesthesia Checklist: Patient identified, Emergency Drugs available, Suction available and Patient being monitored Patient Re-evaluated:Patient Re-evaluated prior to induction Oxygen Delivery Method: Circle System Utilized Preoxygenation: Pre-oxygenation with 100% oxygen Induction Type: IV induction, Rapid sequence and Cricoid Pressure applied Laryngoscope Size: Mac and 3 Grade View: Grade I Tube type: Oral Tube size: 7.0 mm Number of attempts: 1 Airway Equipment and Method: Stylet and Oral airway Placement Confirmation: ETT inserted through vocal cords under direct vision, positive ETCO2 and breath sounds checked- equal and bilateral Secured at: 22 cm Tube secured with: Tape Dental Injury: Teeth and Oropharynx as per pre-operative assessment

## 2022-10-20 NOTE — ED Notes (Signed)
[facility] at this time

## 2022-10-20 NOTE — ED Triage Notes (Signed)
Pt endorses abscess to buttocks for a week with hx of fistula. Minimal drainage. Pt reports infusions to help with this and last one was Friday.

## 2022-10-21 ENCOUNTER — Encounter (HOSPITAL_COMMUNITY): Payer: Self-pay | Admitting: Surgery

## 2022-10-21 MED ORDER — FLUCONAZOLE 100 MG PO TABS: 100.0000 mg | ORAL_TABLET | Freq: Once | ORAL | 0 refills | Status: AC

## 2022-10-21 MED ORDER — METRONIDAZOLE 500 MG PO TABS: 500.0000 mg | ORAL_TABLET | Freq: Two times a day (BID) | ORAL | 0 refills | Status: AC

## 2022-10-21 MED ORDER — ACETAMINOPHEN 325 MG PO TABS: 650.0000 mg | ORAL_TABLET | Freq: Four times a day (QID) | ORAL | Status: AC | PRN

## 2022-10-21 MED ORDER — CIPROFLOXACIN HCL 500 MG PO TABS: 500.0000 mg | ORAL_TABLET | Freq: Two times a day (BID) | ORAL | 0 refills | Status: AC

## 2022-10-21 MED ORDER — OXYCODONE HCL 5 MG PO TABS: 5.0000 mg | ORAL_TABLET | ORAL | 0 refills | Status: AC | PRN

## 2022-10-21 NOTE — Anesthesia Postprocedure Evaluation (Signed)
Anesthesia Post Note  Patient: Romilda Joy  Procedure(s) Performed: EXAM UNDER ANESTHESIA (Rectum) IRRIGATION AND DEBRIDEMENT PERIRECTAL ABSCESS (Rectum)     Patient location during evaluation: PACU Anesthesia Type: General Level of consciousness: awake and alert Pain management: pain level controlled Vital Signs Assessment: post-procedure vital signs reviewed and stable Respiratory status: spontaneous breathing, nonlabored ventilation, respiratory function stable and patient connected to nasal cannula oxygen Cardiovascular status: blood pressure returned to baseline and stable Postop Assessment: no apparent nausea or vomiting Anesthetic complications: no   No notable events documented.  Last Vitals:  Vitals:   10/21/22 0430 10/21/22 0709  BP: 121/75 118/63  Pulse: 62 63  Resp: 18 16  Temp:  36.7 C  SpO2: 99% 99%    Last Pain:  Vitals:   10/21/22 0730  TempSrc:   PainSc: 8    Pain Goal: Patients Stated Pain Goal: 2 (10/21/22 0730)                 Kennieth Rad

## 2022-10-21 NOTE — TOC Transition Note (Signed)
Transition of Care [facility]) - CM/SW Discharge Note   Patient Details  Name: SHARMONIQUE MILMAN MRN: [id number] Date of Birth: [birth year]  Transition of [facility]) CM/SW Contact:  Kermit Balo, RN Phone Number: 10/21/2022, 10:17 AM   Clinical Narrative:     Pt is discharging home with self care. Pt has hospital follow up. Information for PCP placed on AVS.  Pt has transportation home.  Final next level of care: Home/Self Care Barriers to Discharge: No Barriers Identified   Patient Goals and CMS Choice      Discharge Placement                         Discharge Plan and Services Additional resources added to the After Visit Summary for                                       Social Determinants of Health (SDOH) Interventions SDOH Screenings   Food Insecurity: Food Insecurity Present (10/20/2022)  Housing: Low Risk  (10/20/2022)  Transportation Needs: No Transportation Needs (10/20/2022)  Utilities: Not At Risk (10/20/2022)  Depression (PHQ2-9): Low Risk  (03/11/2020)  Social Connections: Unknown (07/26/2021)   Received from [facility]  Tobacco Use: High Risk (10/20/2022)     Readmission Risk Interventions     No data to display

## 2022-10-21 NOTE — Plan of Care (Signed)
Patient alert/oriented X4. Oxycodone administered as needed for pain. Patient PIV removed prior to discharge and AVS discharge instructions explained in detail. Patient education/demonstration provided to family on how to properly dress/drain the penrose drain located on buttocks. Patient personal belongings are packed up at bedside.   Problem: Education: Goal: Knowledge of General Education information will improve Description: Including pain rating scale, medication(s)/side effects and non-pharmacologic comfort measures Outcome: Adequate for Discharge   Problem: Health Behavior/Discharge Planning: Goal: Ability to manage health-related needs will improve Outcome: Adequate for Discharge   Problem: Clinical Measurements: Goal: Ability to maintain clinical measurements within normal limits will improve Outcome: Adequate for Discharge   Problem: Clinical Measurements: Goal: Will remain free from infection Outcome: Adequate for Discharge   Problem: Clinical Measurements: Goal: Diagnostic test results will improve Outcome: Adequate for Discharge   Problem: Clinical Measurements: Goal: Respiratory complications will improve Outcome: Adequate for Discharge   Problem: Clinical Measurements: Goal: Cardiovascular complication will be avoided Outcome: Adequate for Discharge   Problem: Activity: Goal: Risk for activity intolerance will decrease Outcome: Adequate for Discharge   Problem: Nutrition: Goal: Adequate nutrition will be maintained Outcome: Adequate for Discharge   Problem: Coping: Goal: Level of anxiety will decrease Outcome: Adequate for Discharge   Problem: Elimination: Goal: Will not experience complications related to bowel motility Outcome: Adequate for Discharge   Problem: Elimination: Goal: Will not experience complications related to urinary retention Outcome: Adequate for Discharge   Problem: Pain Managment: Goal: General experience of comfort will  improve Outcome: Adequate for Discharge   Problem: Safety: Goal: Ability to remain free from injury will improve Outcome: Adequate for Discharge   Problem: Skin Integrity: Goal: Risk for impaired skin integrity will decrease Outcome: Adequate for Discharge

## 2022-10-21 NOTE — Discharge Summary (Signed)
[facility] Discharge Summary   Patient ID: Amber Blanchard MRN: [id number] DOB/AGE: 1990-04-30 32 y.o.  Admit date: 10/20/2022 Discharge date: 10/21/2022  Admitting Diagnosis: Perianal crohn's disease Perianal abscess   Discharge Diagnosis Patient Active Problem List   Diagnosis Date Noted   Perianal Crohn's disease, with abscess (HCC) 10/20/2022   IDA (iron deficiency anemia) 03/04/2022   Chlamydia 05/04/2019   Marijuana use 10/28/2014   History of cesarean section 10/28/2014   Condyloma of female genitalia 01/07/2014    Consultants None   Imaging: CT PELVIS W CONTRAST  Result Date: 10/20/2022 CLINICAL DATA:  Perianal abscess. EXAM: CT PELVIS WITH CONTRAST TECHNIQUE: Multidetector CT imaging of the pelvis was performed using the standard protocol following the bolus administration of intravenous contrast. RADIATION DOSE REDUCTION: This exam was performed according to the departmental dose-optimization program which includes automated exposure control, adjustment of the mA and/or kV according to patient size and/or use of iterative reconstruction technique. CONTRAST:  OMNIPAQUE IOHEXOL 300 MG/ML  SOLN COMPARISON:  CT January 2015 FINDINGS: Urinary Tract: Bladder is underdistended but has a preserved contour. Bowel: In the pelvis the rectosigmoid colon has a normal course and caliber. Visualized small bowel is preserved. There is a normal caliber appendix. Vascular/Lymphatic: Preserved iliac vessels. There are some enlarged right inguinal lymph nodes. Example series 2, image 34 measures 20 x 13 mm. Few small but prominent perirectal nodes are identified. No additional abnormal lymph node enlargement seen in the visualized pelvis. Reproductive:  Uterus is present.  No separate adnexal mass. Other: There is a rim enhancing fluid collection identified along the subcutaneous fat along the right gluteal cleft with significant stranding. This curvilinear collection has a  estimated dimensions approaching 7.8 x 2.5 cm in the axial plane and cephalocaudal approaching 5.3 cm in the coronal plane. This extends along the gluteal cleft and has a tract extending to the anal region near midline such as axial series 2, image 44 and coronal series 4 image 32. This could represent anal fistula was abscess formation. No soft tissue gas. No further extension along the ischioanal fossa or above the pelvic floor musculature. If needed dedicated workup with anal fistula MRI could be considered as clinically appropriate Musculoskeletal: No suspicious bone lesions identified. IMPRESSION: Complex fluid collection with stranding extending along the right gluteal cleft with possible fistula tract to the posterior aspect of the anal canal. Please correlate for infection and abscess. Enlarged, possibly reactive lymph nodes in the right inguinal region. This also can be assessed on follow-up Electronically Signed   By: Karen Kays M.D.   On: 10/20/2022 11:04    Procedures Dr. Marin Olp (10/20/22) -  PROCEDURE:   Incision and drainage of perirectal abscess with placement of Penrose drain Anorectal exam under anesthesia    Hospital Course:  Amber Blanchard with Crohn's disease on infliximab who presents with perirectal pain. workup showed perianal abscess .  Patient was admitted and underwent procedure listed above.  Tolerated procedure well and was transferred to the floor.  Diet was advanced as tolerated.  On POD#1 the packing was removed from her wound. On POD#1 the patient was voiding well, tolerating diet, ambulating well, pain well controlled, vital signs stable, incisions c/d/i and felt stable for discharge home.  Patient will follow up in our office in 2 weeks and knows to call with questions or concerns.  I have personally reviewed the patients medication history on the [place] controlled substance database.   Physical Exam: General:  Alert, NAD, pleasant, comfortable Abd:  Soft, ND GU:  R perianal abscess with penrose drain, there is a small counter incision posterior to penrose packed with gauze- guaze removed. There is about 4-5 cm of induration/cellulitis.   Allergies as of 10/21/2022   No Known Allergies      Medication List     TAKE these medications    acetaminophen 325 MG tablet Commonly known as: TYLENOL Take 2 tablets (650 mg total) by mouth every 6 (six) hours as needed for mild pain (or temp > 100).   amLODipine 5 MG tablet Commonly known as: NORVASC Take 5 mg by mouth at bedtime.   ciprofloxacin 500 MG tablet Commonly known as: Cipro Take 1 tablet (500 mg total) by mouth 2 (two) times daily for 5 days.   cyanocobalamin 1000 MCG/ML injection Commonly known as: VITAMIN B12 Inject 1,000 mcg into the muscle every 30 (thirty) days.   fluconazole 100 MG tablet Commonly known as: Diflucan Take 1 tablet (100 mg total) by mouth once for 1 dose.   INFLECTRA IV Inject 5 mg into the vein every 6 (six) weeks. Taken at [employer] infusion   metroNIDAZOLE 500 MG tablet Commonly known as: Flagyl Take 1 tablet (500 mg total) by mouth 2 (two) times daily with a meal for 5 days. DO NOT CONSUME ALCOHOL WHILE TAKING THIS MEDICATION.   oxyCODONE 5 MG immediate release tablet Commonly known as: Oxy IR/ROXICODONE Take 1 tablet (5 mg total) by mouth every 4 (four) hours as needed for moderate pain or severe pain (not releived by tylenol).   valsartan-hydrochlorothiazide 80-12.5 MG tablet Commonly known as: DIOVAN-HCT Take 1 tablet by mouth every morning.          Follow-up Information     Maczis, Hedda Slade, [place]. Go on 11/04/2022.   Specialty: General Surgery Why: at 8:30 AM for post-operative follow up and drain removal. please arrive by 8:00 AM Contact information: [address] [place] [address] [phone]                 Signed: Hosie Spangle, [facility] 10/21/2022, 9:49 AM

## 2022-10-31 ENCOUNTER — Ambulatory Visit: Payer: Medicaid Other | Admitting: Gastroenterology

## 2022-10-31 ENCOUNTER — Encounter: Payer: Self-pay | Admitting: Gastroenterology

## 2022-10-31 VITALS — BP 120/80 | HR 92 | Ht 63.0 in | Wt 242.0 lb

## 2022-10-31 DIAGNOSIS — D509 Iron deficiency anemia, unspecified: Secondary | ICD-10-CM

## 2022-10-31 DIAGNOSIS — K315 Obstruction of duodenum: Secondary | ICD-10-CM

## 2022-10-31 DIAGNOSIS — K50019 Crohn's disease of small intestine with unspecified complications: Secondary | ICD-10-CM | POA: Diagnosis not present

## 2022-10-31 DIAGNOSIS — E559 Vitamin D deficiency, unspecified: Secondary | ICD-10-CM

## 2022-10-31 DIAGNOSIS — E538 Deficiency of other specified B group vitamins: Secondary | ICD-10-CM

## 2022-10-31 DIAGNOSIS — K56699 Other intestinal obstruction unspecified as to partial versus complete obstruction: Secondary | ICD-10-CM

## 2022-10-31 NOTE — Progress Notes (Unsigned)
Chief Complaint:    Crohns  GI History: 32 year old female with aggressive phenotypic stricturing and penetrating type Crohn's Disease with upper tract and perianal modifiers, diagnosed 04/2020 (severe perianal disease with multiple perianal fistulous tracts, nontraversable stricture in transverse colon, duodenal strictures).   Evaluation to date: - TPMT: Normal - TB testing: Negative QuantiFERON gold 04/2020 - HBV status: Negative.  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  Bilateral inguinal lymphadenopathy, likely reactive.  Changes of chronic Crohn's disease with postinflammatory mesenteric scarring and mild lymphadenopathy.  No current bowel wall thickening or obstruction.  Mild eccentric wall thickening in the lower rectum and a few mildly enlarged mesorectal lymph nodes. - History of EIMs: None   Medications to date: Prednisone, Inflectra Current medications: Inflectra   Health Maintenance: - DEXA: Ordered previously; not completed - Vaccinations:      - Annual Flu Vaccine -UTD per patient      - Pneumococcal Vaccine -PCV13 given 10/2020. Second dose to be given by [facility] (not stocked here)       - Zoster vaccine if over age 37: Date N/A - Micronutrient eval:      - Annual Vit D, B6, iron panel: UTD      - Duodenal disease: calcium, folate: UTD      - Annual Pap (if immunosuppressed): Obtained through [facility] - Surveillance colonoscopy: Not due - Surveillance labs for immunomodulators: Lab check schedule as below - Annual depression screening: None - Annual Dermatology/Skin exam: Referral previously placed     Endoscopic History: - EGD (04/2020): Normal esophagus and stomach.  2 stenoses in duodenum (path: Active/chronic inflammation) - Colonoscopy (04/2020): Grade 3 hemorrhoids, perianal fistulae, scars from prior fistulous  tracts, severe, nontraversable stenosis in transverse colon (path: Severe active/chronic inflammatory change), mild sigmoid colitis, moderate colitis of distal rectum (path benign) - EGD (04/13/2022): Normal esophagus and stomach.  Subtle mild stenosis in second portion of the duodenum that was traversed, and overall much improved compared with previous endoscopy.  Otherwise normal duodenal mucosa with benign pathology. - Colonoscopy (04/13/2022): Moderate stenosis in proximal ascending colon traversed with ultraslim scope (path: Mildly active chronic colitis consistent with Crohn's).  Otherwise normal colonic mucosa (path benign).  Ileum unable to be intubated.  Grade 2 internal hemorrhoids.  HPI:     Patient is a 32 y.o. female presenting to the Gastroenterology Clinic for hospital follow-up.  Was last seen by me in the office on 03/02/2022.  Labs in 08/2022 with no infliximab antibody, but trough level 111.  Unfortunately this was drawn at the time of her infusion and not accurate.  She was admitted 10/20/2022 overnight for perianal abscess and underwent I&D with placement of Penrose drain and discharged the following day. Drain was removed earlier today in the surgical clinic.   Feeling well today and no active issues. Next infusion is 8/20.    Review of systems:     No chest pain, no SOB, no fevers, no urinary sx   Past Medical History:  Diagnosis Date   Anal stricture    Anemia    Cholelithiasis    External hemorrhoids    Genital condyloma, female    HPV in female    Hypertension    Phreesia 03/11/2020    Patient's surgical history, family medical history, social history, medications and allergies were all reviewed in Epic  Current Outpatient Medications  Medication Sig Dispense Refill   amLODipine (NORVASC) 5 MG tablet Take 5 mg by mouth at bedtime.     cyanocobalamin (VITAMIN B12) 1000 MCG/ML injection Inject 1,000 mcg into the muscle every 30 (thirty) days.      inFLIXimab-dyyb (INFLECTRA IV) Inject 5 mg into the vein every 6 (six) weeks. Taken at [employer] infusion     valsartan-hydrochlorothiazide (DIOVAN-HCT) 80-12.5 MG tablet Take 1 tablet by mouth every morning.     acetaminophen (TYLENOL) 325 MG tablet Take 2 tablets (650 mg total) by mouth every 6 (six) hours as needed for mild pain (or temp > 100). (Patient not taking: Reported on 10/31/2022)     oxyCODONE (OXY IR/ROXICODONE) 5 MG immediate release tablet Take 1 tablet (5 mg total) by mouth every 4 (four) hours as needed for moderate pain or severe pain (not releived by tylenol). (Patient not taking: Reported on 10/31/2022) 15 tablet 0   No current facility-administered medications for this visit.    Physical Exam:     BP 120/80   Pulse 92   Ht 5\' 3"  (1.6 m)   Wt 242 lb (109.8 kg)   LMP 10/09/2022 (Approximate)   BMI 42.87 kg/m   GENERAL:  Pleasant female in NAD PSYCH: : Cooperative, normal affect Musculoskeletal:  Normal muscle tone, normal strength NEURO: Alert and oriented x 3, no focal neurologic deficits   IMPRESSION and PLAN:    1) Crohn's Disease 2) Transverse colon stricture 3) Duodenal strictures 4) Perianal fistulous 32 year old female with aggressive phenotypic stricturing and penetrating type Crohn's Disease with upper tract and perianal modifiers and multiple vitamin deficiencies.  Recent hospital admission with recurrence of draining abscess requiring I&D and Penrose drain placement.  I discussed with Dr. Cliffton Asters at Colorectal Surgery.  She does have fairly significant perianal disease, but no clear fistula noted intraoperatively.  If ongoing or recurrent symptoms, plan for draining seton placement, pelvic MRI, and possibly fluoroscopic evaluation intraoperatively.  - Check infliximab trough level next week, 1 week prior to next infusion scheduled on 11/15/2022 (do not need antiinfliximab antibody checked) - Continue Inflectra 10 mg/kilogram every 6 weeks as scheduled - If  infliximab trough not at goal, may need to consider changing therapy or again discussing dual immunosuppressive therapy with immunomodulators - To follow-up in the surgical clinic - Historically ESR elevated and CRP normal during flare - As previously discussed with her, she really needs to establish with a PCP  5) Vitamin D deficiency 6) B12 deficiency 7) Iron deficiency B12 and iron indices improved on most recent check in 08/2022. - To follow-up with Endocrinology for vitamin D  RTC in 3 months or sooner as needed  I spent 45 minutes of time, including in depth chart review, independent review of results as outlined above, communicating results with the patient directly, face-to-face time with the patient, coordinating care, ordering studies and medications as appropriate, documentation, and discussion with surgical colleagues.       Shellia Cleverly ,DO, FACG 10/31/2022, 3:23 PM

## 2022-10-31 NOTE — Patient Instructions (Addendum)
Lab due on 11/09/22. _______________________________________________________  If your blood pressure at your visit was 140/90 or greater, please contact your primary care physician to follow up on this.  _______________________________________________________  If you are age 33 or older, your body mass index should be between 23-30. Your Body mass index is 42.87 kg/m. If this is out of the aforementioned range listed, please consider follow up with your Primary Care Provider.  If you are age 83 or younger, your body mass index should be between 19-25. Your Body mass index is 42.87 kg/m. If this is out of the aformentioned range listed, please consider follow up with your Primary Care Provider.   __________________________________________________________  The [place] [facility] providers would like to encourage you to use [facility] to communicate with providers for non-urgent requests or questions.  Due to long hold times on the telephone, sending your provider a message by [facility] may be a faster and more efficient way to get a response.  Please allow 48 business hours for a response.  Please remember that this is for non-urgent requests.   Due to recent changes in healthcare laws, you may see the results of your imaging and laboratory studies on MyChart before your provider has had a chance to review them.  We understand that in some cases there may be results that are confusing or concerning to you. Not all laboratory results come back in the same time frame and the provider may be waiting for multiple results in order to interpret others.  Please give [place] 48 hours in order for your provider to thoroughly review all the results before contacting the office for clarification of your results.     Thank you for choosing me and [facility].  Vito Cirigliano, D.O.

## 2022-11-18 ENCOUNTER — Other Ambulatory Visit: Payer: Medicaid Other

## 2022-11-18 ENCOUNTER — Ambulatory Visit: Payer: Medicaid Other | Admitting: Gastroenterology

## 2022-11-18 DIAGNOSIS — K50019 Crohn's disease of small intestine with unspecified complications: Secondary | ICD-10-CM

## 2022-11-25 LAB — SERIAL MONITORING

## 2022-11-26 LAB — INFLIXIMAB+AB (SERIAL MONITOR)
Anti-Infliximab Antibody: <22 ng/mL
Infliximab Drug Level: 17 ug/mL

## 2022-12-21 ENCOUNTER — Encounter: Payer: Self-pay | Admitting: *Deleted

## 2022-12-26 ENCOUNTER — Telehealth: Payer: Self-pay | Admitting: Gastroenterology

## 2022-12-26 NOTE — Telephone Encounter (Signed)
Inbound call from [facility] requesting to speak with a nurse in regards to Inflectra IV. She is wanting a ICD 10 code for the medication. [phone]

## 2022-12-30 NOTE — Telephone Encounter (Signed)
Call from [facility] regarding the message below, they are seeking an ICD code for the medication. (Inflectra).

## 2022-12-30 NOTE — Telephone Encounter (Signed)
I spoke with Randa Evens at [facility] and gave ICD10 code K50.019 (crohns disease of small bowel with complication).

## 2023-01-17 ENCOUNTER — Telehealth: Payer: Self-pay | Admitting: Gastroenterology

## 2023-01-17 DIAGNOSIS — K50019 Crohn's disease of small intestine with unspecified complications: Secondary | ICD-10-CM

## 2023-01-17 NOTE — Telephone Encounter (Signed)
Spoke to patient to advise we would like her to keep appointment later this week, however we also need her to come for labs beforehand. Patient states she will come tomorrow to get those done. Orders entered in EPIC.

## 2023-01-17 NOTE — Telephone Encounter (Signed)
Crohns' patient calls to discuss changing off inflectra to another biologic. She states when first on inflectra, she felt that all symptoms improved 100%; states she feels each symptom that she was originally having "coming back slowly one by one." She notes that she is again having "burning" pain in the top of the abdomen and under the breast, has been having a difficult time with bowel movements. Describes them as "thick and oily."; denies any blood in stool. No fever or chills, +nausea with pain. Patient states that she currently has another abscess on her buttocks.  Patient says she was seen by her surgeon, Dr Cliffton Asters today and they too had the discussion that she may need to change to a different biologic.  Patient has been scheduled to see Dr Barron Alvine in follow up on 01/20/23 to further discuss her concerns.

## 2023-01-17 NOTE — Telephone Encounter (Signed)
Amber Cleverly, DO   01/17/23  3:43 PM Infliximab drug level was appropriate in August without any antibody noted.  With that said, certainly possible that medication is becoming less efficacious.  Plan for the following:  - Check ESR, CRP, although these have been historically normal even during flare. Will also need to interpret results with some degree of caution if any active perianal symptoms - Check fecal calprotectin, CBC - Keep appointment with me later this week.  Depending on labs and symptomatology, may need to consider either adding second agent for dual immunosuppressive therapy or possibly switching from Inflectra to another biologic agent such as Rinvoq or [employer]

## 2023-01-17 NOTE — Telephone Encounter (Signed)
Inbound call from patient, stated she feels Inflectra IV was not working, patient would like to discuss if she can be on another medication. Her schedued infusion is on 11/14. Please advise.

## 2023-01-17 NOTE — Addendum Note (Signed)
Addended by: Richardson Chiquito on: 01/17/2023 04:31 PM   Modules accepted: Orders

## 2023-01-20 ENCOUNTER — Ambulatory Visit: Payer: Medicaid Other | Admitting: Gastroenterology

## 2023-01-20 ENCOUNTER — Encounter: Payer: Self-pay | Admitting: Gastroenterology

## 2023-01-20 VITALS — BP 124/80 | HR 74 | Ht 63.0 in | Wt 245.0 lb

## 2023-01-20 DIAGNOSIS — E559 Vitamin D deficiency, unspecified: Secondary | ICD-10-CM

## 2023-01-20 DIAGNOSIS — K50019 Crohn's disease of small intestine with unspecified complications: Secondary | ICD-10-CM | POA: Diagnosis not present

## 2023-01-20 DIAGNOSIS — K603 Anal fistula, unspecified: Secondary | ICD-10-CM | POA: Diagnosis not present

## 2023-01-20 DIAGNOSIS — K315 Obstruction of duodenum: Secondary | ICD-10-CM | POA: Diagnosis not present

## 2023-01-20 DIAGNOSIS — K56699 Other intestinal obstruction unspecified as to partial versus complete obstruction: Secondary | ICD-10-CM

## 2023-01-20 NOTE — Patient Instructions (Signed)
You have been scheduled for an endoscopy and colonoscopy. Please follow the written instructions given to you at your visit today.  Please pick up your prep supplies at the pharmacy within the next 1-3 days.  If you use inhalers (even only as needed), please bring them with you on the day of your procedure.  DO NOT TAKE 7 DAYS PRIOR TO TEST- Trulicity (dulaglutide) Ozempic, Wegovy (semaglutide) Mounjaro (tirzepatide) Bydureon Bcise (exanatide extended release)  DO NOT TAKE 1 DAY PRIOR TO YOUR TEST Rybelsus (semaglutide) Adlyxin (lixisenatide) Victoza (liraglutide) Byetta (exanatide) _________________________________________________________________________  _______________________________________________________  If your blood pressure at your visit was 140/90 or greater, please contact your primary care physician to follow up on this.  _______________________________________________________  If you are age 40 or older, your body mass index should be between 23-30. Your Body mass index is 43.4 kg/m. If this is out of the aforementioned range listed, please consider follow up with your Primary Care Provider.  If you are age 103 or younger, your body mass index should be between 19-25. Your Body mass index is 43.4 kg/m. If this is out of the aformentioned range listed, please consider follow up with your Primary Care Provider.   ________________________________________________________  The [place] [facility] providers would like to encourage you to use [facility] to communicate with providers for non-urgent requests or questions.  Due to long hold times on the telephone, sending your provider a message by [facility] may be a faster and more efficient way to get a response.  Please allow 48 business hours for a response.  Please remember that this is for non-urgent requests.  _______________________________________________________

## 2023-01-20 NOTE — Progress Notes (Signed)
Chief Complaint:    Crohn's Disease, discuss medication options  GI History: 32 year old female with aggressive phenotypic stricturing and penetrating type Crohn's Disease with upper tract and perianal modifiers, diagnosed 04/2020 (severe perianal disease with multiple perianal fistulous tracts, nontraversable stricture in transverse colon, duodenal strictures).   Evaluation to date: - TPMT: Normal - TB testing: Negative QuantiFERON gold 04/2020 - HBV status: Negative.  Completed vaccine series 2023 - Last colonoscopy: 03/2022 - Small bowel imaging: CTE 08/2020: Small rim-enhancing right paramidline anal rectal fistula measuring 5 x 1 cm.  Bilateral inguinal lymphadenopathy, likely reactive.  Changes of chronic Crohn's disease with postinflammatory mesenteric scarring and mild lymphadenopathy.  No current bowel wall thickening or obstruction.  Mild eccentric wall thickening in the lower rectum and a few mildly enlarged mesorectal lymph nodes.   Medications to date: Prednisone, Inflectra Current medications: Inflectra   Health Maintenance: - DEXA: Ordered previously; not completed - Vaccinations:      - Annual Flu Vaccine -UTD per patient      - Pneumococcal Vaccine - UTD      - Zoster vaccine if over age 49: Date N/A - Micronutrient eval:      - Annual Vit D, B6, iron panel: UTD      - Duodenal disease: calcium, folate: UTD      - Annual Pap (if immunosuppressed): Obtained through [facility] - Surveillance colonoscopy: Scheduled today for 03/2023 - Surveillance labs for immunomodulators: UTD - Annual depression screening: None - Annual Dermatology/Skin exam: Referral previously placed     Endoscopic History: - EGD (04/2020): Normal esophagus and stomach.  2 stenoses in duodenum (path: Active/chronic inflammation) - Colonoscopy (04/2020): Grade 3 hemorrhoids, perianal fistulae, scars from prior fistulous tracts, severe, nontraversable stenosis in transverse colon (path: Severe active/chronic  inflammatory change), mild sigmoid colitis, moderate colitis of distal rectum (path benign) - EGD (04/13/2022): Normal esophagus and stomach.  Subtle mild stenosis in second portion of the duodenum that was traversed, and overall much improved compared with previous endoscopy.  Otherwise normal duodenal mucosa with benign pathology. - Colonoscopy (04/13/2022): Moderate stenosis in proximal ascending colon traversed with ultraslim scope (path: Mildly active chronic colitis consistent with Crohn's).  Otherwise normal colonic mucosa (path benign).  Ileum unable to be intubated.  Grade 2 internal hemorrhoids.  HPI:     Patient is a 32 y.o. female presenting to the Gastroenterology Clinic for follow-up.  I&D of right perirectal abscess with Penrose drain placement on 10/20/2022.  Penrose drain removed on 10/31/2022.  She was last seen by me on 10/31/2022.  Was feeling well at that time.  - 11/18/2022: Infliximab level 17, no antibody detected  In the interim, developed abscess with spontaneous drainage last week. Was seen by Dr. Cliffton Asters at Colorectal Surgery the following day (01/17/2023).  Exam with multiple punctate openings consistent with external openings to fistula in 1 site of spontaneous drainage.  Was treated with Cipro/Flagyl x 7 days.  She is concerned that the Inflectra is less efficacious.  Has been having decreased urge to have BM and having irregular stools.  No diarrhea or hematochezia.  She reports these being similar to index symptoms.  Has episodic burning pain at upper abdomen, but no nausea/vomiting.  Last infusion was 9/25 and no change in symptoms with infusion.    Review of systems:     No chest pain, no SOB, no fevers, no urinary sx   Past Medical History:  Diagnosis Date   Anal stricture    Anemia  Cholelithiasis    External hemorrhoids    Genital condyloma, female    HPV in female    Hypertension    [employer]reesia 03/11/2020    Patient's surgical history, family medical history,  social history, medications and allergies were all reviewed in Epic    Current Outpatient Medications  Medication Sig Dispense Refill   acetaminophen (TYLENOL) 325 MG tablet Take 2 tablets (650 mg total) by mouth every 6 (six) hours as needed for mild pain (or temp > 100).     amLODipine (NORVASC) 5 MG tablet Take 5 mg by mouth at bedtime.     cyanocobalamin (VITAMIN B12) 1000 MCG/ML injection Inject 1,000 mcg into the muscle every 30 (thirty) days.     inFLIXimab-dyyb (INFLECTRA IV) Inject 10 mg into the vein every 6 (six) weeks. Taken at [facility] infusion Premed acetaminophen 650 mg and diphenhydramine 50 mg given before each infusion New order faxed to [facility] 12/21/22; 12 refills     oxyCODONE (OXY IR/ROXICODONE) 5 MG immediate release tablet Take 1 tablet (5 mg total) by mouth every 4 (four) hours as needed for moderate pain or severe pain (not releived by tylenol). 15 tablet 0   valsartan-hydrochlorothiazide (DIOVAN-HCT) 80-12.5 MG tablet Take 1 tablet by mouth every morning.     No current facility-administered medications for this visit.    Physical Exam:     BP 124/80   Pulse 74   Ht 5\' 3"  (1.6 m)   Wt 245 lb (111.1 kg)   BMI 43.40 kg/m   GENERAL:  Pleasant female in NAD PSYCH: : Cooperative, normal affect NEURO: Alert and oriented x 3, no focal neurologic deficits   IMPRESSION and PLAN:    1) Crohn's Disease 2) Transverse colon stricture 3) Duodenal strictures 4) Perianal fistulous 32 year old female with aggressive phenotypic stricturing and penetrating type Crohn's Disease with upper tract and perianal modifiers and multiple vitamin deficiencies.  Most recently with hospital admission in 09/2022 with perirectal abscess requiring I&D and Penrose drain placement (removed 8/5).  Recurrent perirectal abscess earlier this week with spontaneous drainage and decompression without need for repeat I&D.  Was seen in the Colorectal Surgery clinic; currently on  Cipro/Flagyl x 7 days.  - We had an extended conversation today regarding her Crohn's Disease and medication options.  Appropriate drug level and no presence of antiinfliximab antibodies on most recent panel, so escalating dose or shortening interval not indicated.  We discussed the role/utility of adding an immunomodulators for both additional immunosuppressive therapy and immunogenicity benefit.  She is very hesitant to add a second agent, namely due to increasing side effect profile.  Lastly, we discussed changing from Inflectra to another agent such as Rinvoq or Skyrizi.  She is hesitant to change drugs.  Ultimately, she would like to continue with her current treatment plan with close monitoring. - I previously discussed with Dr. Cliffton Asters at Colorectal Surgery.  She does have fairly significant perianal disease, but no clear fistula noted intraoperatively.  If ongoing or recurrent symptoms, plan for draining seton placement, pelvic MRI, and possibly fluoroscopic evaluation intraoperatively - Historically ESR elevated and CRP normal during flare - Repeat colonoscopy and upper endoscopy in January  5) Vitamin D deficiency 6) B12 deficiency 7) Iron deficiency B12 and iron indices improved on most recent check in 08/2022.  Repeat labs at follow-up.  Following with Endocrinology for vitamin D deficiency  I spent 40 minutes of time, including in depth chart review, independent review of results as outlined above,  communicating results with the patient directly, face-to-face time with the patient, coordinating care, ordering studies and medications as appropriate, and documentation.       Shellia Cleverly ,DO, FACG 01/20/2023, 2:58 PM

## 2023-03-31 ENCOUNTER — Encounter: Payer: Medicaid Other | Admitting: Gastroenterology

## 2023-04-14 ENCOUNTER — Ambulatory Visit: Payer: Medicaid Other | Admitting: Gastroenterology

## 2023-05-15 ENCOUNTER — Encounter: Payer: Medicaid Other | Admitting: Gastroenterology

## 2023-07-19 ENCOUNTER — Other Ambulatory Visit (HOSPITAL_COMMUNITY): Payer: Self-pay

## 2023-09-27 ENCOUNTER — Telehealth: Payer: Self-pay | Admitting: Gastroenterology

## 2023-09-27 NOTE — Telephone Encounter (Signed)
 Inbound call from patient requesting to know if Dr. San can change her inflectra  infusion time from 2 hours to 1 hours. Requesting a call back. Please advise, thank you.

## 2023-09-28 NOTE — Telephone Encounter (Signed)
 Called and spoke with patient regarding information from Dr. San. Patient states that she has not completed the infusion over an hour before but was thinking that it would be OK since she has been on the medication since 2021 and it is very time consuming for her. Please advise, thanks.

## 2023-10-02 NOTE — Telephone Encounter (Signed)
 Called and informed patient. Patient verbalized understanding and had no concerns at the end of the call.

## 2023-12-04 ENCOUNTER — Telehealth: Payer: Self-pay

## 2023-12-04 NOTE — Telephone Encounter (Signed)
 Renewal POT signed by Dr. San, faxed back to [facility] at [phone].   Infusion update has been sent to be scanned into chart.

## 2023-12-04 NOTE — Telephone Encounter (Signed)
 Received last Inflectra  treatment documentation from [facility], placed in your IN box for review.   POT Renewal (Inflectra  orders) attached, need signature. Placed in your office for signature. Patient will need updated OV. Last seen 12/2022, [facility] will need updated records for new auth. Patient has cancelled procedures twice and 1 office visit between January and February.

## 2023-12-05 NOTE — Telephone Encounter (Signed)
 Patient returned call. Appt has been scheduled with Deanna May, NP on 01/05/24.

## 2023-12-05 NOTE — Telephone Encounter (Signed)
 Lm on vm for patient to return call

## 2024-01-05 ENCOUNTER — Encounter: Payer: Self-pay | Admitting: Gastroenterology

## 2024-01-05 ENCOUNTER — Ambulatory Visit: Admitting: Gastroenterology

## 2024-01-05 ENCOUNTER — Other Ambulatory Visit (INDEPENDENT_AMBULATORY_CARE_PROVIDER_SITE_OTHER)

## 2024-01-05 VITALS — BP 130/78 | HR 117 | Ht 63.0 in | Wt 200.2 lb

## 2024-01-05 DIAGNOSIS — K50019 Crohn's disease of small intestine with unspecified complications: Secondary | ICD-10-CM

## 2024-01-05 DIAGNOSIS — E538 Deficiency of other specified B group vitamins: Secondary | ICD-10-CM

## 2024-01-05 DIAGNOSIS — E559 Vitamin D deficiency, unspecified: Secondary | ICD-10-CM

## 2024-01-05 DIAGNOSIS — K5909 Other constipation: Secondary | ICD-10-CM

## 2024-01-05 DIAGNOSIS — R140 Abdominal distension (gaseous): Secondary | ICD-10-CM

## 2024-01-05 DIAGNOSIS — D509 Iron deficiency anemia, unspecified: Secondary | ICD-10-CM

## 2024-01-05 LAB — IBC + FERRITIN
Ferritin: 3.5 ng/mL — ABNORMAL LOW (ref 10.0–291.0)
Iron: 18 ug/dL — ABNORMAL LOW (ref 42–145)
Saturation Ratios: 4.2 % — ABNORMAL LOW (ref 20.0–50.0)
TIBC: 424.2 ug/dL (ref 250.0–450.0)
Transferrin: 303 mg/dL (ref 212.0–360.0)

## 2024-01-05 LAB — VITAMIN D 25 HYDROXY (VIT D DEFICIENCY, FRACTURES): VITD: 29.05 ng/mL — ABNORMAL LOW (ref 30.00–100.00)

## 2024-01-05 LAB — B12 AND FOLATE PANEL
Folate: 7 ng/mL (ref >5.9)
Vitamin B-12: 211 pg/mL (ref 211–911)

## 2024-01-05 MED ORDER — LINACLOTIDE 72 MCG PO CAPS
ORAL_CAPSULE | ORAL | 0 refills | Status: AC
Start: 2024-01-05 — End: ?

## 2024-01-05 MED ORDER — NA SULFATE-K SULFATE-MG SULF 17.5-3.13-1.6 GM/177ML PO SOLN: 1.0000 | Freq: Once | ORAL | 0 refills | Status: AC

## 2024-01-05 NOTE — Progress Notes (Signed)
 Chief Complaint:follow-up Crohn's Disease Primary GI Doctor:Dr. San  HPI:   33 year old female with aggressive phenotypic stricturing and penetrating type Crohn's Disease with upper tract and perianal modifiers, diagnosed 04/2020 (severe perianal disease with multiple perianal fistulous tracts, nontraversable stricture in transverse colon, and duodenal strictures).   Evaluation to date: - TPMT: Normal - TB testing: Negative QuantiFERON gold 02/2022 - HBV status: Negative.  Completed vaccine series 2023 - Last colonoscopy: 03/2022 - Small bowel imaging: CTE 08/2020: Small rim-enhancing right paramidline anal rectal fistula measuring 5 x 1 cm.  Bilateral inguinal lymphadenopathy, likely reactive.  Changes of chronic Crohn's disease with postinflammatory mesenteric scarring and mild lymphadenopathy.  No current bowel wall thickening or obstruction.  Mild eccentric wall thickening in the lower rectum and a few mildly enlarged mesorectal lymph nodes. --I&D of right perirectal abscess with Penrose drain placement on 10/20/2022. Penrose drain removed on 10/31/2022.  - 11/18/2022: Infliximab  level 17, no antibody detected  -- 01/17/2023 patient last seen by Dr. Teresa, reviewed entire note.    Medications to date: Prednisone , Inflectra  Current medications: Inflectra    Health Maintenance: - DEXA: Ordered previously; not completed - Vaccinations:      - Annual Flu Vaccine -UTD per patient      - Pneumococcal Vaccine - UTD      - Zoster vaccine if over age 62: Date N/A - Micronutrient eval:      - Annual Vit D, B6, iron panel: UTD      - Duodenal disease: calcium, folate: UTD      - Annual Pap (if immunosuppressed): Obtained through [facility] - Surveillance colonoscopy: Scheduled today for 03/2023 - Surveillance labs for immunomodulators: UTD - Annual depression screening: None - Annual Dermatology/Skin exam: Referral previously placed     Endoscopic History: - EGD (04/2020): Normal esophagus  and stomach.  2 stenoses in duodenum (path: Active/chronic inflammation) - Colonoscopy (04/2020): Grade 3 hemorrhoids, perianal fistulae, scars from prior fistulous tracts, severe, nontraversable stenosis in transverse colon (path: Severe active/chronic inflammatory change), mild sigmoid colitis, moderate colitis of distal rectum (path benign) - EGD (04/13/2022): Normal esophagus and stomach.  Subtle mild stenosis in second portion of the duodenum that was traversed, and overall much improved compared with previous endoscopy.  Otherwise normal duodenal mucosa with benign pathology. - Colonoscopy (04/13/2022): Moderate stenosis in proximal ascending colon traversed with ultraslim scope (path: Mildly active chronic colitis consistent with Crohn's).  Otherwise normal colonic mucosa (path benign).  Ileum unable to be intubated.  Grade 2 internal hemorrhoids.  Interval History    Patient presents for follow-up on Crohn's disease. She notes the Inflectra  has been working well, no current issues. She has not had any issues requiring her to see general surgery. She is due for endoscopic procedures which we discussed today.     Her only complaint today is of chronic constipation and bloating. She reports she uses OTC EasyLax 3 capsules po daily. She reports she has BM's that will come out with small formed pieces along with liquid stools. She does not feel she empties out.  She reports nausea when constipated. She denies blood in stool or abdominal pain.  She has lost 45lbs in past year with dietary changes alone.   Wt Readings from Last 3 Encounters:  01/05/24 200 lb 4 oz (90.8 kg)  01/20/23 245 lb (111.1 kg)  10/31/22 242 lb (109.8 kg)     Past Medical History:  Diagnosis Date   Anal stricture    Anemia    Cholelithiasis  External hemorrhoids    Genital condyloma, female    HPV in female    Hypertension    Phreesia 03/11/2020    Past Surgical History:  Procedure Laterality Date   CESAREAN  SECTION  10/21/2010   Procedure: CESAREAN SECTION;  Surgeon: Rexene DOROTHA Hoit;  Location: [facility];  Service: Gynecology;  Laterality: N/A;   CESAREAN SECTION WITH BILATERAL TUBAL LIGATION Bilateral 02/20/2015   Procedure: CESAREAN SECTION WITH BILATERAL TUBAL LIGATION;  Surgeon: Lynwood KANDICE Solomons, MD;  Location: [facility];  Service: Obstetrics;  Laterality: Bilateral;   INCISION AND DRAINAGE PERIRECTAL ABSCESS N/A 10/20/2022   Procedure: IRRIGATION AND DEBRIDEMENT PERIRECTAL ABSCESS;  Surgeon: Teresa Lonni HERO, MD;  Location: [facility];  Service: General;  Laterality: N/A;    Current Outpatient Medications  Medication Sig Dispense Refill   acetaminophen  (TYLENOL ) 325 MG tablet Take 2 tablets (650 mg total) by mouth every 6 (six) hours as needed for mild pain (or temp > 100).     amLODipine  (NORVASC ) 5 MG tablet Take 5 mg by mouth at bedtime.     cyanocobalamin  (VITAMIN B12) 1000 MCG/ML injection Inject 1,000 mcg into the muscle every 30 (thirty) days.     inFLIXimab -dyyb (INFLECTRA  IV) Inject 10 mg into the vein every 6 (six) weeks. Taken at [employer] acetaminophen  650 mg and diphenhydramine  50 mg given before each infusion New order faxed to [facility] 12/21/22; 12 refills     ondansetron  (ZOFRAN -ODT) 4 MG disintegrating tablet Take 4 mg by mouth every 4 (four) hours as needed.     oxyCODONE  (OXY IR/ROXICODONE ) 5 MG immediate release tablet Take 1 tablet (5 mg total) by mouth every 4 (four) hours as needed for moderate pain or severe pain (not releived by tylenol ). 15 tablet 0   valsartan-hydrochlorothiazide (DIOVAN-HCT) 80-12.5 MG tablet Take 1 tablet by mouth every morning.     WEGOVY 0.5 MG/0.5ML SOAJ SQ injection Inject 0.5 mg into the skin once a week.     No current facility-administered medications for this visit.    Allergies as of 01/05/2024   (No Known Allergies)    Family History  Problem Relation Age of Onset   Healthy Mother    Hypertension Father     Healthy Sister    Healthy Brother    Colon cancer Neg Hx    Esophageal cancer Neg Hx    Rectal cancer Neg Hx    Stomach cancer Neg Hx     Review of Systems:    Constitutional: No weight loss, fever, chills, weakness or fatigue HEENT: Eyes: No change in vision               Ears, Nose, Throat:  No change in hearing or congestion Skin: No rash or itching Cardiovascular: No chest pain, chest pressure or palpitations   Respiratory: No SOB or cough Gastrointestinal: See HPI and otherwise negative Genitourinary: No dysuria or change in urinary frequency Neurological: No headache, dizziness or syncope Musculoskeletal: No new muscle or joint pain Hematologic: No bleeding or bruising Psychiatric: No history of depression or anxiety    Physical Exam:  Vital signs: BP 130/78 (BP Location: Right Arm, Patient Position: Sitting, Cuff Size: Normal)   Pulse (!) 117   Ht 5' 3 (1.6 m)   Wt 200 lb 4 oz (90.8 kg)   BMI 35.47 kg/m   Constitutional:   Pleasant  female appears to be in NAD, Well developed, Well nourished, alert and cooperative Throat: Oral cavity and pharynx  without inflammation, swelling or lesion.  Respiratory: Respirations even and unlabored. Lungs clear to auscultation bilaterally.   No wheezes, crackles, or rhonchi.  Cardiovascular: Normal S1, S2. Regular rate and rhythm. No peripheral edema, cyanosis or pallor.  Gastrointestinal:  Soft, nondistended, nontender. No rebound or guarding. Normal bowel sounds. No appreciable masses or hepatomegaly. Rectal:  Not performed.  Msk:  Symmetrical without gross deformities. Without edema, no deformity or joint abnormality.  Neurologic:  Alert and  oriented x4;  grossly normal neurologically.  Skin:   Dry and intact without significant lesions or rashes.  RELEVANT LABS AND IMAGING: CBC    Latest Ref Rng & Units 10/20/2022    8:40 AM 10/11/2022   10:36 AM 09/13/2022   12:36 PM  CBC  WBC 4.0 - 10.5 K/uL 12.5  7.4  12.9   Hemoglobin  12.0 - 15.0 g/dL 88.5  88.0  87.6   Hematocrit 36.0 - 46.0 % 33.9  36.4  37.8   Platelets 150 - 400 K/uL 342  296.0  296.0      CMP     Latest Ref Rng & Units 10/20/2022    8:40 AM 08/25/2021    4:31 PM 10/13/2020    3:17 PM  CMP  Glucose 70 - 99 mg/dL 896  89    BUN 6 - 20 mg/dL 12  14    Creatinine 9.55 - 1.00 mg/dL 9.32  9.24    Sodium 864 - 145 mmol/L 134  139    Potassium 3.5 - 5.1 mmol/L 3.3  4.2    Chloride 98 - 111 mmol/L 101  103    CO2 22 - 32 mmol/L 26  24    Calcium 8.9 - 10.3 mg/dL 8.9  8.9    Total Protein 6.0 - 8.5 g/dL  7.3  7.7   Total Bilirubin 0.0 - 1.2 mg/dL  0.3  0.4   Alkaline Phos 44 - 121 IU/L  70  57   AST 0 - 40 IU/L  22  19   ALT 0 - 32 IU/L  17  12      Lab Results  Component Value Date   TSH 0.270 (L) 03/11/2020     Assessment: Encounter Diagnoses  Name Primary?   Crohn's disease of small intestine with complication (HCC) Yes   Vitamin D  deficiency    B12 deficiency    Iron deficiency anemia, unspecified iron deficiency anemia type    Bloating    Chronic constipation     33 year old female with aggressive phenotypic stricturing and penetrating type Crohn's Disease with upper tract and perianal modifiers and multiple vitamin deficiencies.  Patient currently on infliximab  and doing well.  Her main complaint today is of constipation with bloating.  1.) Crohn's Disease 2) Transverse colon stricture 3) Duodenal strictures 4) Perianal fistulous  5) Vitamin D  deficiency 6) B12 deficiency 7) Iron deficiency 8.) chronic constipation with bloating  Plan: -  check B 12, iron panel, vitamin D  today -due TB gold -Linzess 72mcg po daily samples  -Schedule EGD in [facility] with Dr. San. The risks and benefits of EGD with possible biopsies and esophageal dilation were discussed with the patient who agrees to proceed. - Schedule for a colonoscopy in [facility] with Dr. San. The risks and benefits of colonoscopy with possible polypectomy / biopsies  were discussed and the patient agrees to proceed.  Thank you for the courtesy of this consult. Please call me with any questions or concerns.   Tawnie Ehresman, FNP-C  [facility] 01/05/2024, 11:30 AM  Cc: No ref. provider found

## 2024-01-05 NOTE — Patient Instructions (Addendum)
 Take Linzess 1 capsule 30-45 minute sbefore first meal of day with full glass of water. If works well let us  know so we can send prescription.   You have been scheduled for an endoscopy and colonoscopy. Please follow the written instructions given to you at your visit today.  If you use inhalers (even only as needed), please bring them with you on the day of your procedure.  DO NOT TAKE 7 DAYS PRIOR TO TEST- Trulicity (dulaglutide) Ozempic, Wegovy (semaglutide) Mounjaro (tirzepatide) Bydureon Bcise (exanatide extended release)  DO NOT TAKE 1 DAY PRIOR TO YOUR TEST Rybelsus (semaglutide) Adlyxin (lixisenatide) Victoza (liraglutide) Byetta (exanatide) ___________________________________________________________________________   Your provider has requested that you go to the basement level for lab work before leaving today. Press B on the elevator. The lab is located at the first door on the left as you exit the elevator.   _______________________________________________________  If your blood pressure at your visit was 140/90 or greater, please contact your primary care physician to follow up on this.  _______________________________________________________  If you are age 46 or older, your body mass index should be between 23-30. Your Body mass index is 35.47 kg/m. If this is out of the aforementioned range listed, please consider follow up with your Primary Care Provider.  If you are age 80 or younger, your body mass index should be between 19-25. Your Body mass index is 35.47 kg/m. If this is out of the aformentioned range listed, please consider follow up with your Primary Care Provider.   ________________________________________________________  The [facility] providers would like to encourage you to use MYCHART to communicate with providers for non-urgent requests or questions.  Due to long hold times on the telephone, sending your provider a message by [facility] be a  faster and more efficient way to get a response.  Please allow 48 business hours for a response.  Please remember that this is for non-urgent requests.  _______________________________________________________  [facility] is using a team-based approach to care.  Your team is made up of your doctor and two to three APPS. Our APPS (Nurse Practitioners and Physician Assistants) work with your physician to ensure care continuity for you. They are fully qualified to address your health concerns and develop a treatment plan. They communicate directly with your gastroenterologist to care for you. Seeing the Advanced Practice Practitioners on your physician's team can help you by facilitating care more promptly, often allowing for earlier appointments, access to diagnostic testing, procedures, and other specialty referrals.

## 2024-01-08 ENCOUNTER — Ambulatory Visit: Payer: Self-pay | Admitting: Gastroenterology

## 2024-01-08 NOTE — Progress Notes (Signed)
 Agree with the assessment and plan as outlined by [facility], FNP-C.  Amber Bottino, DO, [facility]

## 2024-01-09 LAB — QUANTIFERON-TB GOLD PLUS
Mitogen-NIL: 7.33 [IU]/mL
NIL: 0.01 [IU]/mL
QuantiFERON-TB Gold Plus: NEGATIVE
TB1-NIL: 0 [IU]/mL
TB2-NIL: 0 [IU]/mL

## 2024-01-15 ENCOUNTER — Telehealth: Payer: Self-pay

## 2024-01-15 NOTE — Telephone Encounter (Signed)
 01/05/24 office notes & lab results have been faxed to [facility] at [phone] for Inflectra  renewal order (faxed on 12/04/23).

## 2024-01-15 NOTE — Telephone Encounter (Signed)
-----   Message from Nurse [facility] sent at 12/05/2023  2:13 PM EDT ----- Regarding: Records to [facility] Fax medical records to [facility] after 01/05/24 appointment with Deanna, NP.

## 2024-01-16 ENCOUNTER — Other Ambulatory Visit: Payer: Self-pay

## 2024-01-16 ENCOUNTER — Telehealth: Payer: Self-pay | Admitting: Gastroenterology

## 2024-01-16 DIAGNOSIS — K5909 Other constipation: Secondary | ICD-10-CM

## 2024-01-16 MED ORDER — LINACLOTIDE 72 MCG PO CAPS: 145.0000 ug | ORAL_CAPSULE | Freq: Every day | ORAL | 0 refills | Status: DC

## 2024-01-16 NOTE — Telephone Encounter (Signed)
 Pt recently had an office visit with Deanna May NP on 01/05/2024: Was given 2 bottles of Samples for the Linzess 72 mcg. Pt stated that she took 1 dose of 72 mcg and had no results. Pt stated that her next dose that she then took three pills and had results.  Last BM on Sunday when she took the 3 pills.  Pt was notified that the medications take time and that they are not instant. Pt was encouraged to try the lower dosage and give it time to work.  Pt requesting  Dr San recommendations and a  prescription for a higher dose.  Please review and advise.

## 2024-01-16 NOTE — Telephone Encounter (Signed)
 Pt made aware of Dr. San recommendations: Prescription was sent to pharmacy for 10 day supply. Pt was notified to call our office in 10 days with a symptom update.  Pt verbalized understanding with all questions answered.

## 2024-01-16 NOTE — Telephone Encounter (Signed)
 Inbound call from patient wanting to know if she can be prescribed a higher dosage of medication linzess. Requesting a call back  Please advise  Thank you

## 2024-01-24 ENCOUNTER — Telehealth: Payer: Self-pay

## 2024-02-06 ENCOUNTER — Other Ambulatory Visit: Payer: Self-pay | Admitting: Gastroenterology

## 2024-02-06 ENCOUNTER — Telehealth: Payer: Self-pay | Admitting: Gastroenterology

## 2024-02-06 DIAGNOSIS — K5909 Other constipation: Secondary | ICD-10-CM

## 2024-02-06 NOTE — Telephone Encounter (Signed)
 Inbound call from patient requesting a refill for Linzess. Please advise.

## 2024-02-19 ENCOUNTER — Telehealth: Payer: Self-pay | Admitting: Gastroenterology

## 2024-02-19 DIAGNOSIS — R053 Chronic cough: Secondary | ICD-10-CM

## 2024-02-19 DIAGNOSIS — K50019 Crohn's disease of small intestine with unspecified complications: Secondary | ICD-10-CM

## 2024-02-19 NOTE — Telephone Encounter (Signed)
 Patient called stating she believes she is having side effects from inflectra  that she would like to discuss further. Please advise, thank you

## 2024-02-19 NOTE — Telephone Encounter (Signed)
 Called and spoke with patient. Patient states that she is aware that being on Inflectra  can compromise her immune system and she has not been able to get well. Patient reports that for the last 57-months she had an underlying cold - cough, congestion, SOB, chills, diarrhea 10 x times, liquid stool, no appetite. Patient states that she will seem to get better for about a week then symptoms return. Patient has not undergone any diagnostic testing (chest x-ray, flu/covid testing/lab work). Patient has been taking OTC medications with no relief. Patient states that she only got tested for HIV. I do not see a PCP on file. Patient is aware that you are out of the office this afternoon and we will give her a call back tomorrow with your recommendations. Please advise, thanks.

## 2024-02-21 NOTE — Telephone Encounter (Signed)
 Lm on vm for patient to return call. Advised that we do close at 2 pm today and won't open back up until 02/26/24.   Labs and chest x-ray orders in epic.

## 2024-02-21 NOTE — Addendum Note (Signed)
 Addended by: Edmund Rick N on: 02/21/2024 01:46 PM   Modules accepted: Orders

## 2024-02-26 NOTE — Telephone Encounter (Signed)
 Lm on vm for patient to return call

## 2024-02-27 NOTE — Telephone Encounter (Signed)
No return call received. Will await further communication from patient.

## 2024-02-29 ENCOUNTER — Encounter: Admitting: Gastroenterology

## 2024-03-11 ENCOUNTER — Other Ambulatory Visit: Payer: Self-pay | Admitting: Gastroenterology

## 2024-03-11 DIAGNOSIS — K5909 Other constipation: Secondary | ICD-10-CM

## 2024-03-12 ENCOUNTER — Ambulatory Visit: Admitting: Gastroenterology

## 2024-03-13 ENCOUNTER — Telehealth: Payer: Self-pay | Admitting: Gastroenterology

## 2024-03-13 ENCOUNTER — Other Ambulatory Visit: Payer: Self-pay

## 2024-03-13 MED ORDER — LINACLOTIDE 145 MCG PO CAPS: 145.0000 ug | ORAL_CAPSULE | Freq: Every day | ORAL | 5 refills | Status: AC

## 2024-03-13 NOTE — Telephone Encounter (Signed)
 Inbound call from patient stating she is needing medication linzess  refilled  Please advise  Thank you

## 2024-04-01 ENCOUNTER — Other Ambulatory Visit: Payer: Self-pay

## 2024-04-01 ENCOUNTER — Telehealth: Payer: Self-pay

## 2024-04-01 DIAGNOSIS — E559 Vitamin D deficiency, unspecified: Secondary | ICD-10-CM

## 2024-04-01 DIAGNOSIS — D509 Iron deficiency anemia, unspecified: Secondary | ICD-10-CM

## 2024-04-01 NOTE — Telephone Encounter (Signed)
-----   Message from [facility] sent at 01/25/2024 10:24 AM EDT ----- Check vitamin D  and Iron in 3 mths, labs need to be put in

## 2024-06-19 ENCOUNTER — Ambulatory Visit: Admitting: Gastroenterology
